# Patient Record
Sex: Female | Born: 1965 | Race: White | Hispanic: No | Smoking: Current every day smoker
Health system: Southern US, Community
[De-identification: ages and names within clinical notes are randomized; demographics above are authoritative.]

## PROBLEM LIST (undated history)

## (undated) DIAGNOSIS — R0600 Dyspnea, unspecified: Secondary | ICD-10-CM

## (undated) DIAGNOSIS — T8859XA Other complications of anesthesia, initial encounter: Secondary | ICD-10-CM

## (undated) DIAGNOSIS — F419 Anxiety disorder, unspecified: Secondary | ICD-10-CM

## (undated) DIAGNOSIS — Z8711 Personal history of peptic ulcer disease: Secondary | ICD-10-CM

## (undated) DIAGNOSIS — Z8639 Personal history of other endocrine, nutritional and metabolic disease: Secondary | ICD-10-CM

## (undated) DIAGNOSIS — M1990 Unspecified osteoarthritis, unspecified site: Secondary | ICD-10-CM

## (undated) DIAGNOSIS — M545 Low back pain, unspecified: Secondary | ICD-10-CM

## (undated) DIAGNOSIS — G90A Postural orthostatic tachycardia syndrome (POTS): Secondary | ICD-10-CM

## (undated) DIAGNOSIS — F32A Depression, unspecified: Secondary | ICD-10-CM

## (undated) DIAGNOSIS — J189 Pneumonia, unspecified organism: Secondary | ICD-10-CM

## (undated) DIAGNOSIS — T4145XA Adverse effect of unspecified anesthetic, initial encounter: Secondary | ICD-10-CM

## (undated) DIAGNOSIS — K219 Gastro-esophageal reflux disease without esophagitis: Secondary | ICD-10-CM

## (undated) DIAGNOSIS — G8929 Other chronic pain: Secondary | ICD-10-CM

## (undated) DIAGNOSIS — J449 Chronic obstructive pulmonary disease, unspecified: Secondary | ICD-10-CM

## (undated) DIAGNOSIS — Z8719 Personal history of other diseases of the digestive system: Secondary | ICD-10-CM

## (undated) HISTORY — PX: TONSILLECTOMY: SUR1361

## (undated) HISTORY — DX: Postural orthostatic tachycardia syndrome (POTS): G90.A

## (undated) HISTORY — PX: BACK SURGERY: SHX140

---

## 1992-11-29 DIAGNOSIS — Z8711 Personal history of peptic ulcer disease: Secondary | ICD-10-CM

## 1992-11-29 HISTORY — PX: UPPER GASTROINTESTINAL ENDOSCOPY: SHX188

## 1992-11-29 HISTORY — DX: Personal history of peptic ulcer disease: Z87.11

## 1995-11-30 HISTORY — PX: DILATION AND CURETTAGE OF UTERUS: SHX78

## 1995-11-30 HISTORY — PX: VAGINAL HYSTERECTOMY: SUR661

## 2011-11-30 DIAGNOSIS — J189 Pneumonia, unspecified organism: Secondary | ICD-10-CM

## 2011-11-30 HISTORY — DX: Pneumonia, unspecified organism: J18.9

## 2011-11-30 HISTORY — PX: ANTERIOR LUMBAR FUSION: SHX1170

## 2017-05-06 ENCOUNTER — Encounter (HOSPITAL_COMMUNITY): Payer: Self-pay | Admitting: Emergency Medicine

## 2017-05-06 ENCOUNTER — Emergency Department (HOSPITAL_COMMUNITY)
Admission: EM | Admit: 2017-05-06 | Discharge: 2017-05-06 | Disposition: A | Discharge status: Home / Self Care | Payer: Medicaid Other | Attending: Emergency Medicine | Admitting: Emergency Medicine

## 2017-05-06 DIAGNOSIS — M542 Cervicalgia: Secondary | ICD-10-CM | POA: Diagnosis present

## 2017-05-06 DIAGNOSIS — F17200 Nicotine dependence, unspecified, uncomplicated: Secondary | ICD-10-CM | POA: Insufficient documentation

## 2017-05-06 DIAGNOSIS — M5012 Mid-cervical disc disorder, unspecified level: Secondary | ICD-10-CM | POA: Diagnosis not present

## 2017-05-06 DIAGNOSIS — Z91040 Latex allergy status: Secondary | ICD-10-CM | POA: Insufficient documentation

## 2017-05-06 DIAGNOSIS — M5020 Other cervical disc displacement, unspecified cervical region: Secondary | ICD-10-CM

## 2017-05-06 MED ORDER — FLUCONAZOLE 100 MG PO TABS: 100.0000 mg | ORAL_TABLET | Freq: Every day | ORAL | 0 refills | Status: DC

## 2017-05-06 MED ORDER — DIAZEPAM 5 MG PO TABS: 5.0000 mg | ORAL_TABLET | Freq: Two times a day (BID) | ORAL | 0 refills | Status: DC

## 2017-05-06 MED ORDER — NYSTATIN 100000 UNIT/ML MT SUSP: 500000.0000 [IU] | Freq: Four times a day (QID) | OROMUCOSAL | 0 refills | Status: DC

## 2017-05-06 MED ORDER — DEXAMETHASONE 2 MG PO TABS: 2.0000 mg | ORAL_TABLET | Freq: Every day | ORAL | 0 refills | Status: AC

## 2017-05-06 MED ORDER — LORAZEPAM 2 MG/ML IJ SOLN
1.0000 mg | Freq: Once | INTRAMUSCULAR | Status: AC
  Administered 2017-05-06: 1 mg via INTRAMUSCULAR
  Filled 2017-05-06: qty 1

## 2017-05-06 NOTE — Discharge Instructions (Signed)
Please read and follow all provided instructions.  Your diagnoses today include:  1. Cervical herniated disc   2. Neck pain     Tests performed today include: Vital signs. See below for your results today.   Medications prescribed:  Take as prescribed   Home care instructions:  Follow any educational materials contained in this packet.  Follow-up instructions: Please follow-up with Neurosurgery for further evaluation of symptoms and treatment. Call on Monday  Return instructions:  Please return to the Emergency Department if you do not get better, if you get worse, or new symptoms OR  - Fever (temperature greater than 101.75F)  - Bleeding that does not stop with holding pressure to the area    -Severe pain (please note that you may be more sore the day after your accident)  - Chest Pain  - Difficulty breathing  - Severe nausea or vomiting  - Inability to tolerate food and liquids  - Passing out  - Skin becoming red around your wounds  - Change in mental status (confusion or lethargy)  - New numbness or weakness    Please return if you have any other emergent concerns.  Additional Information:  Your vital signs today were: BP 104/84 (BP Location: Right Arm)    Pulse (!) 108    Temp 98.3 F (36.8 C) (Oral)    Resp 18    SpO2 96%  If your blood pressure (BP) was elevated above 135/85 this visit, please have this repeated by your doctor within one month. ---------------

## 2017-05-06 NOTE — ED Notes (Signed)
Pt states she has "something laying on my spinal cord". Has MRI of neck in hand. States she was recently admitted to [facility] for this. Saw Dr. Channing Muttersoy (NS) and he wanted to treat with steroids first. Pt feels she needs surgery. States she has "burning, swelling of face and neck" since taking steroids which she completed 1 week ago. Voice sounds slightly hoarse.

## 2017-05-06 NOTE — ED Triage Notes (Signed)
Per EMS: pt from home c/o chronic neck and back pain worse x 3 weeks; pt sts herniated disc and [facility]

## 2017-05-06 NOTE — ED Provider Notes (Signed)
[facility]-EMERGENCY DEPT Provider Note   CSN: [id number] Arrival date & time: 05/06/17  1409  By signing my name below, I, Debra [name], attest that this documentation has been prepared under the direction and in the presence of non-physician practitioner, Audry PiliMohr, Velmer Broadfoot, PA-C. Electronically Signed: Rosana Fretana [name], ED Scribe. 05/06/17. 3:09 PM.  History   Chief Complaint Chief Complaint  Patient presents with  . Back Pain   The history is provided by the patient. No language interpreter was used.   HPI Comments: Debra [name] is a 51 y.o. female who presents to the Emergency Department via EMS complaining of unchanged, neck pain onset 3 weeks ago. [facility] where they did a MRI, diagnosed her with a herniated disc and gave her steroids. Pt has seen no improvement in her pain and states that she needs surgery. Pt states pain is exacerbated by direct pressure and palpation. Pt reports associated muscle spasms in her neck, diaphoresis, facial swelling and tingling to her face. Pt states she has thrush in her mouth and throat. No CP/SOB/ABD pain. No fevers. No N/V. No other complaints at this time.    History reviewed. No pertinent past medical history.  There are no active problems to display for this patient.   History reviewed. No pertinent surgical history.  OB History    No data available       Home Medications    Prior to Admission medications   Not on File    Family History History reviewed. No pertinent family history.  Social History Social History  Substance Use Topics  . Smoking status: Current Every Day Smoker  . Smokeless tobacco: Never Used  . Alcohol use No     Allergies   Latex   Review of Systems Review of Systems  Constitutional: Positive for diaphoresis. Negative for fever.  HENT: Positive for facial swelling.   Respiratory: Negative for shortness of breath.   Cardiovascular: Negative for chest pain.  Gastrointestinal: Negative for  abdominal pain, nausea and vomiting.  Musculoskeletal: Positive for neck pain.  Neurological: Positive for numbness.   Physical Exam Updated Vital Signs BP 104/84 (BP Location: Right Arm)   Pulse (!) 108   Temp 98.3 F (36.8 C) (Oral)   Resp 18   SpO2 96%   Physical Exam  Constitutional: She is oriented to person, place, and time. Vital signs are normal. She appears well-developed and well-nourished.  HENT:  Head: Normocephalic and atraumatic.  Right Ear: Hearing normal.  Left Ear: Hearing normal.  Eyes: Conjunctivae and EOM are normal. Pupils are equal, round, and reactive to light.  Neck: Normal range of motion. Neck supple.  Cardiovascular: Normal rate, regular rhythm, normal heart sounds and intact distal pulses.   Pulmonary/Chest: Effort normal and breath sounds normal.  Musculoskeletal: Normal range of motion.  Neurological: She is alert and oriented to person, place, and time. She exhibits normal muscle tone.  Equal upper extremity grip strength bilaterally. Slight decrease in sensation on left compared to the right. ROM intact. No atrophy   Skin: Skin is warm and dry.  Psychiatric: She has a normal mood and affect. Her speech is normal and behavior is normal. Thought content normal.  Nursing note and vitals reviewed.  ED Treatments / Results  DIAGNOSTIC STUDIES: Oxygen Saturation is 96% on RA, normal by my interpretation.   COORDINATION OF CARE: 2:53 PM-Discussed next steps with pt including consulting with Neuro. Pt verbalized understanding and is agreeable with the plan.   Labs (all  labs ordered are listed, but only abnormal results are displayed) Labs Reviewed - No data to display  EKG  EKG Interpretation None       Radiology No results found.  Procedures Procedures (including critical care time)  Medications Ordered in ED Medications - No data to display   Initial Impression / Assessment and Plan / ED Course  I have reviewed the triage vital  signs and the nursing notes.  Pertinent labs & imaging results that were available during my care of the patient were reviewed by me and considered in my medical decision making (see chart for details).  Final Clinical Impressions(s) / ED Diagnoses   {I have reviewed and evaluated the relevant imaging studies.  {I have reviewed the relevant previous healthcare records.  {I obtained HPI from historian.   ED Course:  Assessment: Pt is a 51 y.o. female who presents with worsening left sided neck pain and numbness to left arm. Seen at [facility] 3 weeks ago with diagnoses of herniated disc on C6-C7. MRI report given by patent shows cord flattening on left and left C7 nerve root impingement. On exam, pt in NAD. Nontoxic/nonseptic appearing. VSS. Afebrile. Lungs CTA. Heart RRR. Mild decrease in sensation of Left UE compared to Right. No atrophy. Grip strengths equal. Consult to Neurosurgery (Dr. Danielle Dess) reviewed MRI. No acute pathology identified. Continue Dexamethasone. Given Nystatin for Thrush. Plan is to DC home with follow up with Neurosurgery. At time of discharge, Patient is in no acute distress. Vital Signs are stable. Patient is able to ambulate. Patient able to tolerate PO.   Disposition/Plan:  DC Home Additional Verbal discharge instructions given and discussed with patient.  Pt Instructed to f/u with Neurosurgery in the next week for evaluation and treatment of symptoms. Return precautions given Pt acknowledges and agrees with plan  Supervising Physician Tilden Fossa, MD  Final diagnoses:  Cervical herniated disc  Neck pain    New Prescriptions New Prescriptions   No medications on file   I personally performed the services described in this documentation, which was scribed in my presence. The recorded information has been reviewed and is accurate.    Audry Pili, PA-C 05/06/17 1626    Tilden Fossa, MD 05/07/17 [phone]

## 2017-05-11 ENCOUNTER — Other Ambulatory Visit: Payer: Self-pay | Admitting: Neurosurgery

## 2017-05-17 ENCOUNTER — Encounter (HOSPITAL_COMMUNITY): Payer: Self-pay | Admitting: *Deleted

## 2017-05-18 ENCOUNTER — Ambulatory Visit (HOSPITAL_COMMUNITY)
Admission: RE | Admit: 2017-05-18 | Discharge: 2017-05-19 | Disposition: A | Discharge status: Home / Self Care | Payer: Medicaid Other | Source: Ambulatory Visit | Attending: Neurosurgery | Admitting: Neurosurgery

## 2017-05-18 ENCOUNTER — Ambulatory Visit (HOSPITAL_COMMUNITY): Payer: Medicaid Other

## 2017-05-18 ENCOUNTER — Ambulatory Visit (HOSPITAL_COMMUNITY): Payer: Medicaid Other | Admitting: Certified Registered Nurse Anesthetist

## 2017-05-18 ENCOUNTER — Encounter (HOSPITAL_COMMUNITY)
Admission: RE | Disposition: A | Discharge status: Home / Self Care | Payer: Self-pay | Source: Ambulatory Visit | Attending: Neurosurgery

## 2017-05-18 ENCOUNTER — Encounter (HOSPITAL_COMMUNITY): Payer: Self-pay | Admitting: *Deleted

## 2017-05-18 DIAGNOSIS — Z79899 Other long term (current) drug therapy: Secondary | ICD-10-CM | POA: Insufficient documentation

## 2017-05-18 DIAGNOSIS — M542 Cervicalgia: Secondary | ICD-10-CM | POA: Diagnosis present

## 2017-05-18 DIAGNOSIS — Z981 Arthrodesis status: Secondary | ICD-10-CM | POA: Diagnosis not present

## 2017-05-18 DIAGNOSIS — F17200 Nicotine dependence, unspecified, uncomplicated: Secondary | ICD-10-CM | POA: Insufficient documentation

## 2017-05-18 DIAGNOSIS — M2578 Osteophyte, vertebrae: Secondary | ICD-10-CM | POA: Insufficient documentation

## 2017-05-18 DIAGNOSIS — K219 Gastro-esophageal reflux disease without esophagitis: Secondary | ICD-10-CM | POA: Insufficient documentation

## 2017-05-18 DIAGNOSIS — M50123 Cervical disc disorder at C6-C7 level with radiculopathy: Secondary | ICD-10-CM | POA: Insufficient documentation

## 2017-05-18 DIAGNOSIS — Z419 Encounter for procedure for purposes other than remedying health state, unspecified: Secondary | ICD-10-CM

## 2017-05-18 DIAGNOSIS — M5020 Other cervical disc displacement, unspecified cervical region: Secondary | ICD-10-CM | POA: Diagnosis present

## 2017-05-18 HISTORY — DX: Anxiety disorder, unspecified: F41.9

## 2017-05-18 HISTORY — DX: Gastro-esophageal reflux disease without esophagitis: K21.9

## 2017-05-18 HISTORY — PX: ANTERIOR CERVICAL DECOMP/DISCECTOMY FUSION: SHX1161

## 2017-05-18 HISTORY — DX: Pneumonia, unspecified organism: J18.9

## 2017-05-18 HISTORY — DX: Personal history of peptic ulcer disease: Z87.11

## 2017-05-18 HISTORY — DX: Dyspnea, unspecified: R06.00

## 2017-05-18 LAB — SURGICAL PCR SCREEN
MRSA, PCR: NEGATIVE
STAPHYLOCOCCUS AUREUS: NEGATIVE

## 2017-05-18 LAB — CBC
HCT: 43.4 % (ref 36.0–46.0)
HEMOGLOBIN: 14.3 g/dL (ref 12.0–15.0)
MCH: 31.6 pg (ref 26.0–34.0)
MCHC: 32.9 g/dL (ref 30.0–36.0)
MCV: 96 fL (ref 78.0–100.0)
Platelets: 472 10*3/uL — ABNORMAL HIGH (ref 150–400)
RBC: 4.52 MIL/uL (ref 3.87–5.11)
RDW: 14.5 % (ref 11.5–15.5)
WBC: 12.5 10*3/uL — AB (ref 4.0–10.5)

## 2017-05-18 SURGERY — ANTERIOR CERVICAL DECOMPRESSION/DISCECTOMY FUSION 1 LEVEL
Anesthesia: General

## 2017-05-18 MED ORDER — DIAZEPAM 5 MG PO TABS
5.0000 mg | ORAL_TABLET | Freq: Four times a day (QID) | ORAL | Status: DC | PRN
  Administered 2017-05-18 – 2017-05-19 (×2): 5 mg via ORAL
  Filled 2017-05-18 (×2): qty 1

## 2017-05-18 MED ORDER — ONDANSETRON HCL 4 MG/2ML IJ SOLN
INTRAMUSCULAR | Status: DC | PRN
  Administered 2017-05-18: 4 mg via INTRAVENOUS

## 2017-05-18 MED ORDER — SODIUM CHLORIDE 0.9% FLUSH: 3.0000 mL | INTRAVENOUS | Status: DC | PRN

## 2017-05-18 MED ORDER — DEXAMETHASONE SODIUM PHOSPHATE 10 MG/ML IJ SOLN
INTRAMUSCULAR | Status: AC
  Filled 2017-05-18: qty 1

## 2017-05-18 MED ORDER — DEXTROSE 5 % IV SOLN
INTRAVENOUS | Status: DC | PRN
  Administered 2017-05-18: 50 ug/min via INTRAVENOUS

## 2017-05-18 MED ORDER — FENTANYL CITRATE (PF) 100 MCG/2ML IJ SOLN
50.0000 ug | Freq: Once | INTRAMUSCULAR | Status: AC
  Administered 2017-05-18: 50 ug via INTRAVENOUS
  Filled 2017-05-18: qty 2

## 2017-05-18 MED ORDER — PROPOFOL 10 MG/ML IV BOLUS
INTRAVENOUS | Status: DC | PRN
  Administered 2017-05-18: 130 mg via INTRAVENOUS

## 2017-05-18 MED ORDER — ONDANSETRON HCL 4 MG PO TABS: 4.0000 mg | ORAL_TABLET | Freq: Four times a day (QID) | ORAL | Status: DC | PRN

## 2017-05-18 MED ORDER — BACLOFEN 10 MG PO TABS
20.0000 mg | ORAL_TABLET | Freq: Three times a day (TID) | ORAL | Status: DC
  Administered 2017-05-18: 20 mg via ORAL
  Filled 2017-05-18: qty 2

## 2017-05-18 MED ORDER — PHENYLEPHRINE HCL 10 MG/ML IJ SOLN
INTRAMUSCULAR | Status: DC | PRN
  Administered 2017-05-18 (×2): 80 ug via INTRAVENOUS
  Administered 2017-05-18 (×2): 40 ug via INTRAVENOUS

## 2017-05-18 MED ORDER — SODIUM CHLORIDE 0.9 % IV SOLN: 250.0000 mL | INTRAVENOUS | Status: DC

## 2017-05-18 MED ORDER — POTASSIUM CHLORIDE IN NACL 20-0.9 MEQ/L-% IV SOLN
INTRAVENOUS | Status: DC
  Administered 2017-05-18: 22:00:00 via INTRAVENOUS
  Filled 2017-05-18 (×2): qty 1000

## 2017-05-18 MED ORDER — CLINDAMYCIN PHOSPHATE 900 MG/50ML IV SOLN
900.0000 mg | INTRAVENOUS | Status: AC
  Administered 2017-05-18: 900 mg via INTRAVENOUS
  Filled 2017-05-18: qty 50

## 2017-05-18 MED ORDER — ROCURONIUM BROMIDE 10 MG/ML (PF) SYRINGE
PREFILLED_SYRINGE | INTRAVENOUS | Status: AC
  Filled 2017-05-18: qty 5

## 2017-05-18 MED ORDER — SUGAMMADEX SODIUM 200 MG/2ML IV SOLN
INTRAVENOUS | Status: DC | PRN
  Administered 2017-05-18: 200 mg via INTRAVENOUS

## 2017-05-18 MED ORDER — DEXAMETHASONE SODIUM PHOSPHATE 4 MG/ML IJ SOLN
INTRAMUSCULAR | Status: DC | PRN
  Administered 2017-05-18: 10 mg via INTRAVENOUS

## 2017-05-18 MED ORDER — TRAMADOL HCL 50 MG PO TABS
50.0000 mg | ORAL_TABLET | Freq: Four times a day (QID) | ORAL | Status: DC | PRN
  Administered 2017-05-18: 50 mg via ORAL
  Filled 2017-05-18: qty 1

## 2017-05-18 MED ORDER — PANTOPRAZOLE SODIUM 40 MG PO TBEC
40.0000 mg | DELAYED_RELEASE_TABLET | Freq: Every day | ORAL | Status: DC
  Administered 2017-05-19: 40 mg via ORAL

## 2017-05-18 MED ORDER — 0.9 % SODIUM CHLORIDE (POUR BTL) OPTIME
TOPICAL | Status: DC | PRN
  Administered 2017-05-18: 1000 mL

## 2017-05-18 MED ORDER — HYDROMORPHONE HCL 1 MG/ML IJ SOLN
INTRAMUSCULAR | Status: AC
  Administered 2017-05-18: 0.5 mg via INTRAVENOUS
  Filled 2017-05-18: qty 0.5

## 2017-05-18 MED ORDER — CEFAZOLIN SODIUM-DEXTROSE 2-4 GM/100ML-% IV SOLN: 2.0000 g | INTRAVENOUS | Status: DC

## 2017-05-18 MED ORDER — FENTANYL CITRATE (PF) 100 MCG/2ML IJ SOLN
INTRAMUSCULAR | Status: DC | PRN
  Administered 2017-05-18: 100 ug via INTRAVENOUS
  Administered 2017-05-18 (×3): 50 ug via INTRAVENOUS

## 2017-05-18 MED ORDER — ONDANSETRON HCL 4 MG/2ML IJ SOLN: 4.0000 mg | Freq: Four times a day (QID) | INTRAMUSCULAR | Status: DC | PRN

## 2017-05-18 MED ORDER — THROMBIN 5000 UNITS EX SOLR
CUTANEOUS | Status: DC | PRN
  Administered 2017-05-18: 10000 [IU] via TOPICAL

## 2017-05-18 MED ORDER — LIDOCAINE-EPINEPHRINE 0.5 %-1:200000 IJ SOLN
INTRAMUSCULAR | Status: DC | PRN
  Administered 2017-05-18: 5 mL

## 2017-05-18 MED ORDER — SODIUM CHLORIDE 0.9% FLUSH
3.0000 mL | Freq: Two times a day (BID) | INTRAVENOUS | Status: DC
  Administered 2017-05-18 – 2017-05-19 (×2): 3 mL via INTRAVENOUS

## 2017-05-18 MED ORDER — HYDROMORPHONE HCL 1 MG/ML IJ SOLN
0.2500 mg | INTRAMUSCULAR | Status: DC | PRN
  Administered 2017-05-18 (×2): 0.5 mg via INTRAVENOUS

## 2017-05-18 MED ORDER — PROPOFOL 10 MG/ML IV BOLUS
INTRAVENOUS | Status: AC
  Filled 2017-05-18: qty 20

## 2017-05-18 MED ORDER — THROMBIN 5000 UNITS EX SOLR
CUTANEOUS | Status: AC
  Filled 2017-05-18: qty 10000

## 2017-05-18 MED ORDER — SENNOSIDES-DOCUSATE SODIUM 8.6-50 MG PO TABS: 1.0000 | ORAL_TABLET | Freq: Every evening | ORAL | Status: DC | PRN

## 2017-05-18 MED ORDER — IPRATROPIUM BROMIDE HFA 17 MCG/ACT IN AERS
INHALATION_SPRAY | RESPIRATORY_TRACT | Status: DC | PRN
  Administered 2017-05-18: 4 via RESPIRATORY_TRACT

## 2017-05-18 MED ORDER — ZOLPIDEM TARTRATE 5 MG PO TABS: 5.0000 mg | ORAL_TABLET | Freq: Every evening | ORAL | Status: DC | PRN

## 2017-05-18 MED ORDER — MENTHOL 3 MG MT LOZG
1.0000 | LOZENGE | OROMUCOSAL | Status: DC | PRN
  Filled 2017-05-18: qty 9

## 2017-05-18 MED ORDER — SUCCINYLCHOLINE CHLORIDE 200 MG/10ML IV SOSY
PREFILLED_SYRINGE | INTRAVENOUS | Status: AC
  Filled 2017-05-18: qty 10

## 2017-05-18 MED ORDER — DOCUSATE SODIUM 100 MG PO CAPS
100.0000 mg | ORAL_CAPSULE | Freq: Two times a day (BID) | ORAL | Status: DC
  Administered 2017-05-18 – 2017-05-19 (×2): 100 mg via ORAL
  Filled 2017-05-18 (×2): qty 1

## 2017-05-18 MED ORDER — ACETAMINOPHEN 650 MG RE SUPP: 650.0000 mg | RECTAL | Status: DC | PRN

## 2017-05-18 MED ORDER — ACETAMINOPHEN 325 MG PO TABS: 650.0000 mg | ORAL_TABLET | ORAL | Status: DC | PRN

## 2017-05-18 MED ORDER — CHLORHEXIDINE GLUCONATE CLOTH 2 % EX PADS: 6.0000 | MEDICATED_PAD | Freq: Once | CUTANEOUS | Status: DC

## 2017-05-18 MED ORDER — PHENOL 1.4 % MT LIQD: 1.0000 | OROMUCOSAL | Status: DC | PRN

## 2017-05-18 MED ORDER — LIDOCAINE 2% (20 MG/ML) 5 ML SYRINGE
INTRAMUSCULAR | Status: AC
  Filled 2017-05-18: qty 5

## 2017-05-18 MED ORDER — BISACODYL 5 MG PO TBEC: 5.0000 mg | DELAYED_RELEASE_TABLET | Freq: Every day | ORAL | Status: DC | PRN

## 2017-05-18 MED ORDER — PREGABALIN 50 MG PO CAPS
100.0000 mg | ORAL_CAPSULE | Freq: Three times a day (TID) | ORAL | Status: DC
  Administered 2017-05-18 – 2017-05-19 (×2): 100 mg via ORAL
  Filled 2017-05-18 (×2): qty 2

## 2017-05-18 MED ORDER — SUCCINYLCHOLINE CHLORIDE 20 MG/ML IJ SOLN
INTRAMUSCULAR | Status: DC | PRN
  Administered 2017-05-18: 100 mg via INTRAVENOUS

## 2017-05-18 MED ORDER — LIDOCAINE-EPINEPHRINE 0.5 %-1:200000 IJ SOLN
INTRAMUSCULAR | Status: AC
  Filled 2017-05-18: qty 1

## 2017-05-18 MED ORDER — CELECOXIB 200 MG PO CAPS
200.0000 mg | ORAL_CAPSULE | Freq: Two times a day (BID) | ORAL | Status: DC
  Administered 2017-05-19: 200 mg via ORAL
  Filled 2017-05-18 (×3): qty 1

## 2017-05-18 MED ORDER — MIDAZOLAM HCL 5 MG/5ML IJ SOLN
INTRAMUSCULAR | Status: DC | PRN
  Administered 2017-05-18: 2 mg via INTRAVENOUS

## 2017-05-18 MED ORDER — ONDANSETRON HCL 4 MG/2ML IJ SOLN
4.0000 mg | Freq: Once | INTRAMUSCULAR | Status: AC
  Administered 2017-05-18: 4 mg via INTRAVENOUS
  Filled 2017-05-18: qty 2

## 2017-05-18 MED ORDER — ONDANSETRON HCL 4 MG/2ML IJ SOLN
INTRAMUSCULAR | Status: AC
  Filled 2017-05-18: qty 2

## 2017-05-18 MED ORDER — FENTANYL CITRATE (PF) 250 MCG/5ML IJ SOLN
INTRAMUSCULAR | Status: AC
  Filled 2017-05-18: qty 5

## 2017-05-18 MED ORDER — LACTATED RINGERS IV SOLN
INTRAVENOUS | Status: DC
  Administered 2017-05-18 (×2): via INTRAVENOUS

## 2017-05-18 MED ORDER — MUPIROCIN 2 % EX OINT
1.0000 "application " | TOPICAL_OINTMENT | Freq: Once | CUTANEOUS | Status: AC
  Administered 2017-05-18: 1 via TOPICAL
  Filled 2017-05-18: qty 22

## 2017-05-18 MED ORDER — LIDOCAINE HCL (CARDIAC) 20 MG/ML IV SOLN
INTRAVENOUS | Status: DC | PRN
  Administered 2017-05-18: 60 mg via INTRAVENOUS

## 2017-05-18 MED ORDER — HEMOSTATIC AGENTS (NO CHARGE) OPTIME
TOPICAL | Status: DC | PRN
  Administered 2017-05-18: 1 via TOPICAL

## 2017-05-18 MED ORDER — MIDAZOLAM HCL 2 MG/2ML IJ SOLN
INTRAMUSCULAR | Status: AC
  Filled 2017-05-18: qty 2

## 2017-05-18 MED ORDER — CITALOPRAM HYDROBROMIDE 40 MG PO TABS
40.0000 mg | ORAL_TABLET | Freq: Every evening | ORAL | Status: DC
  Administered 2017-05-18: 40 mg via ORAL
  Filled 2017-05-18: qty 1

## 2017-05-18 SURGICAL SUPPLY — 50 items
BLADE CLIPPER SURG (BLADE) IMPLANT
BNDG GAUZE ELAST 4 BULKY (GAUZE/BANDAGES/DRESSINGS) IMPLANT
BUR DRUM 4.0 (BURR) ×2 IMPLANT
BUR DRUM 4.0MM (BURR) ×1
BUR MATCHSTICK NEURO 3.0 LAGG (BURR) ×3 IMPLANT
CANISTER SUCT 3000ML PPV (MISCELLANEOUS) ×3 IMPLANT
CARTRIDGE OIL MAESTRO DRILL (MISCELLANEOUS) ×1 IMPLANT
DECANTER SPIKE VIAL GLASS SM (MISCELLANEOUS) ×3 IMPLANT
DERMABOND ADVANCED (GAUZE/BANDAGES/DRESSINGS) ×2
DERMABOND ADVANCED .7 DNX12 (GAUZE/BANDAGES/DRESSINGS) ×1 IMPLANT
DIFFUSER DRILL AIR PNEUMATIC (MISCELLANEOUS) ×3 IMPLANT
DRAPE HALF SHEET 40X57 (DRAPES) IMPLANT
DRAPE LAPAROTOMY 100X72 PEDS (DRAPES) ×3 IMPLANT
DRAPE MICROSCOPE LEICA (MISCELLANEOUS) ×3 IMPLANT
DRAPE POUCH INSTRU U-SHP 10X18 (DRAPES) ×3 IMPLANT
DURAPREP 6ML APPLICATOR 50/CS (WOUND CARE) ×3 IMPLANT
ELECT COATED BLADE 2.86 ST (ELECTRODE) ×3 IMPLANT
ELECT REM PT RETURN 9FT ADLT (ELECTROSURGICAL) ×3
ELECTRODE REM PT RTRN 9FT ADLT (ELECTROSURGICAL) ×1 IMPLANT
GAUZE SPONGE 4X4 16PLY XRAY LF (GAUZE/BANDAGES/DRESSINGS) IMPLANT
GLOVE ECLIPSE 6.5 STRL STRAW (GLOVE) IMPLANT
GLOVE EXAM NITRILE LRG STRL (GLOVE) IMPLANT
GLOVE EXAM NITRILE XL STR (GLOVE) IMPLANT
GLOVE EXAM NITRILE XS STR PU (GLOVE) IMPLANT
GOWN STRL REUS W/ TWL LRG LVL3 (GOWN DISPOSABLE) ×2 IMPLANT
GOWN STRL REUS W/ TWL XL LVL3 (GOWN DISPOSABLE) IMPLANT
GOWN STRL REUS W/TWL 2XL LVL3 (GOWN DISPOSABLE) IMPLANT
GOWN STRL REUS W/TWL LRG LVL3 (GOWN DISPOSABLE) ×4
GOWN STRL REUS W/TWL XL LVL3 (GOWN DISPOSABLE)
KIT BASIN OR (CUSTOM PROCEDURE TRAY) ×3 IMPLANT
KIT ROOM TURNOVER OR (KITS) ×3 IMPLANT
NEEDLE HYPO 25X1 1.5 SAFETY (NEEDLE) ×3 IMPLANT
NEEDLE SPNL 22GX3.5 QUINCKE BK (NEEDLE) ×3 IMPLANT
NS IRRIG 1000ML POUR BTL (IV SOLUTION) ×3 IMPLANT
OIL CARTRIDGE MAESTRO DRILL (MISCELLANEOUS) ×3
PACK LAMINECTOMY NEURO (CUSTOM PROCEDURE TRAY) ×3 IMPLANT
PAD ARMBOARD 7.5X6 YLW CONV (MISCELLANEOUS) ×9 IMPLANT
PLATE HELIX R 22MM (Plate) ×3 IMPLANT
RUBBERBAND STERILE (MISCELLANEOUS) ×6 IMPLANT
SCREW 4.0X13 (Screw) ×4 IMPLANT
SCREW 4.0X13MM (Screw) ×8 IMPLANT
SPACER ACF PARALLEL 7MM (Bone Implant) ×3 IMPLANT
SPONGE INTESTINAL PEANUT (DISPOSABLE) ×3 IMPLANT
SPONGE SURGIFOAM ABS GEL SZ50 (HEMOSTASIS) ×3 IMPLANT
SUT VIC AB 0 CT1 27 (SUTURE) ×2
SUT VIC AB 0 CT1 27XBRD ANTBC (SUTURE) ×1 IMPLANT
SUT VIC AB 3-0 SH 8-18 (SUTURE) ×9 IMPLANT
TOWEL GREEN STERILE (TOWEL DISPOSABLE) ×3 IMPLANT
TOWEL GREEN STERILE FF (TOWEL DISPOSABLE) ×3 IMPLANT
WATER STERILE IRR 1000ML POUR (IV SOLUTION) ×3 IMPLANT

## 2017-05-18 NOTE — H&P (Signed)
BP (!) 127/91   Pulse (!) 119   Temp 97.9 F (36.6 C) (Oral)   Resp 18   Ht 5\' 6"  (1.676 m)   Wt 74.8 kg (165 lb)   SpO2 97%   BMI 26.63 kg/m  HISTORY OF PRESENT ILLNESS: Debra [name] is a 51 year old woman who presents today for evaluation of pain that she has in her neck and left upper extremity. She has had this pain now for approximately a month. She has never had pain like this in the past. She said it started on May 14. Says she woke up and had severe pain in the left upper extremity. She thought maybe it was related to a fall 2 years ago. Says nothing relieves the pain. She has been to the emergency room multiple times. She has been placed on steroids and has been on steroids in one form or another, Decadron, prednisone or the like over the last month. She has pain in arm, neck, legs and back. She feels a burning all over her body. She feels as if she cannot keep her balance. She feels she has swelling in the stomach, neck, throat and face. She sweats each and every day. She has numbness and tingling in the head and neck and has had a month were she cannot sleep more than 2-3 hours a day. She has mouth sores and severe thrush, which she says she has chronically. She has leg pain with walking, neck pain, arm weakness, leg weakness, back pain, arm pain, leg pain, anxiety and excessive thirst. She is right handed. SOCIAL HISTORY: She does smoke. She works as a Pharmacologistpharmacy technician. She does not use alcohol. No history of illicit drug use. PAST MEDICAL HISTORY: Includes a fusion and hysterectomy. MEDICATIONS: She is currently taking Lyrica, baclofen, Celexa and Decadron 2 mg once a day. PHYSICAL EXAMINATION: Vital signs are as follows: Height 5 feet 1 inch, weight 164 pounds, temperature is 98.9 degrees Fahrenheit, blood pressure is 129/80, pulse is 76. Pain is 4/10. General: She is alert, oriented x4. Answers all questions appropriately. Neurologic: Memory, language, attention span, and fund  of knowledge are normal. Speech is clear. It is fluent. She is extraordinarily anxious. She has weakness 4/5 in the left triceps, left biceps, otherwise strength is 5/5. Reflexes 2+ biceps, triceps, brachioradialis, knees, and ankles. Proprioception is intact in both the upper and lower extremities. Romberg test is negative. She can tandem walk. Muscle tone, bulk, coordination is otherwise normal. Pupils equal, round, reactive to light. Full extraocular movements. Full visual fields. Hearing intact to voice. Uvula elevates in the midline. Shoulder shrug is normal. Tongue protrudes in the midline. IMAGING: MRI shows a fairly large herniated disc eccentric to the left at C6-7. Spinal cord has normal signal. The rest of the spine looks quite good. DIAGNOSIS:  Displaced disc C6-7, left.  Left C7 radiculopathy.  Given the fact that she has significant weakness in the triceps and a herniated disc on MRI, I have recommended and she has agreed to undergo operative decompression with anterior plating at C6-7. I will plan on doing this next week, Wednesday. I would expect only on an overnight hospitalization. She received a detailed instruction sheet going over the risks and benefits of the procedure, which were discussed, bleeding, infection, no relief, need for further surgery, fusion failure, hardware failure, damage to the spinal cord, paralysis, weakness in one of both forearms, one or both legs, and/or bowel or bladder dysfunction.

## 2017-05-18 NOTE — Anesthesia Procedure Notes (Signed)
Procedure Name: Intubation Date/Time: 05/18/2017 3:44 PM Performed by: Ollen Bowl Pre-anesthesia Checklist: Patient identified, Emergency Drugs available, Suction available, Patient being monitored and Timeout performed Patient Re-evaluated:Patient Re-evaluated prior to inductionOxygen Delivery Method: Circle system utilized Preoxygenation: Pre-oxygenation with 100% oxygen Intubation Type: IV induction, Rapid sequence and Cricoid Pressure applied Laryngoscope Size: Mac and 3 Grade View: Grade I Tube size: 7.0 mm Number of attempts: 1 Airway Equipment and Method: Patient positioned with wedge pillow and Stylet Placement Confirmation: ETT inserted through vocal cords under direct vision,  positive ETCO2 and breath sounds checked- equal and bilateral Secured at: 22 cm Tube secured with: Tape Dental Injury: Teeth and Oropharynx as per pre-operative assessment  Comments: Placed by Guerry Bruin

## 2017-05-18 NOTE — Op Note (Signed)
05/18/2017  5:21 PM  PATIENT:  Debra [name]  51 y.o. female with an acute disc herniation at C6/7 on the left, and left triceps weakness.  PRE-OPERATIVE DIAGNOSIS:  HNP C6/7  POST-OPERATIVE DIAGNOSIS:  HNP C6/7  PROCEDURE:  Anterior Cervical decompression C6/7 Arthrodesis C6/7 with 7mm structural allograft Anterior instrumentation(Helix R) C6/7  SURGEON:   Surgeon(s): Coletta Memosabbell, Aj Crunkleton, MD   ASSISTANTS:none  ANESTHESIA:   general  EBL:  Total I/O In: 1000 [I.V.:1000] Out: 50 [Blood:50]  BLOOD ADMINISTERED:none  CELL SAVER GIVEN:none  COUNT:per nursing  DRAINS: none   SPECIMEN:  No Specimen  DICTATION: [name] was taken to the operating room, intubated, and placed under general anesthesia without difficulty. She was positioned supine with her head in slight extension on a horseshoe headrest. The neck was prepped and draped in a sterile manner. I infiltrated 5 cc's 1/2%lidocaine/1:200,000 strength epinephrine into the planned incision starting from the midline to the medial border of the left sternocleidomastoid muscle. I opened the incision with a 10 blade and dissected sharply through soft tissue to the platysma. I dissected in the plane superior to the platysma both rostrally and caudally. I then opened the platysma in a horizontal fashion with Metzenbaum scissors, and dissected in the inferior plane rostrally and caudally. With both blunt and sharp technique I created an avascular corridor to the cervical spine. I placed a spinal needle(s) in the disc space at 5/6 . I then reflected the longus colli from C6 to C7 and placed self retaining retractors. I opened the disc space(s) at C6/7 with a 15 blade. I removed disc with curettes, Kerrison punches, and the drill. Using the drill I removed osteophytes and prepared for the decompression.  I decompressed the spinal canal and the C7 root(s) with the drill, Kerrison punches, and the curettes. I used the microscope to aid in  microdissection. I removed the posterior longitudinal ligament to fully expose and decompress the thecal sac. I exposed the roots laterally taking down the 6/7 uncovertebral joints. With the decompression complete I moved on to the arthrodesis. I used the drill to level the surfaces of C6,7. I removed soft tissue to prepare the disc space and the bony surfaces. I measured the space and placed a 7mm structural allograft into the disc space.  I then placed the anterior instrumentation. I placed 2 screws in each vertebral body through the plate. I locked the screws into place. Intraoperative xray showed the graft, plate, and screws to be in good position. I irrigated the wound, achieved hemostasis, and closed the wound in layers. I approximated the platysma, and the subcuticular plane with vicryl sutures. I used Dermabond for a sterile dressing.   PLAN OF CARE: Admit for overnight observation  PATIENT DISPOSITION:  PACU - hemodynamically stable.   Delay start of Pharmacological VTE agent (>24hrs) due to surgical blood loss or risk of bleeding:  yes

## 2017-05-18 NOTE — Progress Notes (Signed)
MD made aware Ancef ordered and cephalosporin allergy. No new orders at this time.

## 2017-05-18 NOTE — Transfer of Care (Signed)
Immediate Anesthesia Transfer of Care Note  Patient: Debra [name]  Procedure(s) Performed: Procedure(s): ACDF - C6-C7 (N/A)  Patient Location: PACU  Anesthesia Type:General  Level of Consciousness: awake, alert  and oriented  Airway & Oxygen Therapy: Patient Spontanous Breathing and Patient connected to nasal cannula oxygen  Post-op Assessment: Report given to RN, Post -op Vital signs reviewed and stable and Patient moving all extremities X 4  Post vital signs: Reviewed and stable  Last Vitals:  Vitals:   05/18/17 1345 05/18/17 1738  BP: (!) 127/91 100/80  Pulse: (!) 119 (!) 114  Resp: 18 20  Temp: 36.6 C 37.2 C    Last Pain:  Vitals:   05/18/17 1738  TempSrc:   PainSc: (P) 0-No pain      Patients Stated Pain Goal: 5 (05/18/17 1349)  Complications: No apparent anesthesia complications

## 2017-05-18 NOTE — Anesthesia Preprocedure Evaluation (Signed)
Anesthesia Evaluation  Patient identified by MRN, date of birth, ID band Patient awake    Reviewed: Allergy & Precautions, H&P , NPO status , Patient's Chart, lab work & pertinent test results  Airway Mallampati: II  TM Distance: >3 FB Neck ROM: Full    Dental no notable dental hx. (+) Teeth Intact, Dental Advisory Given   Pulmonary Current Smoker,    Pulmonary exam normal breath sounds clear to auscultation       Cardiovascular negative cardio ROS   Rhythm:Regular Rate:Normal     Neuro/Psych Anxiety negative neurological ROS  negative psych ROS   GI/Hepatic Neg liver ROS, GERD  Medicated and Poorly Controlled,  Endo/Other  negative endocrine ROS  Renal/GU negative Renal ROS  negative genitourinary   Musculoskeletal   Abdominal   Peds  Hematology negative hematology ROS (+)   Anesthesia Other Findings   Reproductive/Obstetrics negative OB ROS                             Anesthesia Physical Anesthesia Plan  ASA: II  Anesthesia Plan: General   Post-op Pain Management:    Induction: Intravenous, Rapid sequence and Cricoid pressure planned  PONV Risk Score and Plan: 3 and Ondansetron, Dexamethasone, Propofol and Midazolam  Airway Management Planned:   Additional Equipment:   Intra-op Plan:   Post-operative Plan: Extubation in OR  Informed Consent: I have reviewed the patients History and Physical, chart, labs and discussed the procedure including the risks, benefits and alternatives for the proposed anesthesia with the patient or authorized representative who has indicated his/her understanding and acceptance.   Dental advisory given  Plan Discussed with: CRNA  Anesthesia Plan Comments:         Anesthesia Quick Evaluation

## 2017-05-19 ENCOUNTER — Encounter (HOSPITAL_COMMUNITY): Payer: Self-pay | Admitting: Neurosurgery

## 2017-05-19 DIAGNOSIS — M50123 Cervical disc disorder at C6-C7 level with radiculopathy: Secondary | ICD-10-CM | POA: Diagnosis not present

## 2017-05-19 MED ORDER — OXYCODONE-ACETAMINOPHEN 5-325 MG PO TABS
1.0000 | ORAL_TABLET | ORAL | Status: DC | PRN
  Administered 2017-05-19 (×3): 1 via ORAL
  Filled 2017-05-19 (×3): qty 1

## 2017-05-19 MED ORDER — TRAMADOL HCL 50 MG PO TABS: 50.0000 mg | ORAL_TABLET | Freq: Four times a day (QID) | ORAL | 0 refills | Status: DC | PRN

## 2017-05-19 NOTE — Progress Notes (Signed)
Occupational Therapy Evaluation Patient Details Name: Debra [name] MRN: [id number] DOB: [birth year] Today's Date: 05/19/2017    History of Present Illness                              51 yo s/p Anterior Cervical decompression C6/7. Hx of back surgery.    Clinical Impression   Pt lives alone but will have her son assist as needed. Completed education regarding cervical precautions and ADL/mobility and IADL tasks. Pt educated on HEP for grip/pinch strengthening. Pt will benefit from use of a shower seat to reduce risk of falls and adhere to cervical precautions. Pt safe to DC home when medically stable. Pt to follow up with MD to progress strengthening of LUE as indicated.     Follow Up Recommendations  DC plan and follow up therapy as arranged by surgeon    Equipment Recommendations  Tub/shower seat    Recommendations for Other Services       Precautions / Restrictions Precautions Precautions: Cervical Restrictions Weight Bearing Restrictions: No      Mobility Bed Mobility      education on correct technique to adhere to cervical precautions            Transfers Overall transfer level: Modified independent                    Balance Overall balance assessment: No apparent balance deficits (not formally assessed)                                         ADL either performed or assessed with clinical judgement   ADL                                         General ADL Comments: Completed education regarding cervical precautions during ADL. Pt able to return demonstrate. Educated on cervical precautions during IADL tasks. Rec pt use reacher @ house.     Vision         Perception     Praxis      Pertinent Vitals/Pain Pain Assessment: 0-10 Pain Score: 4  Pain Location: neck, arm Pain Descriptors / Indicators: Aching;Discomfort;Numbness Pain Intervention(s): Limited activity within patient's tolerance;Premedicated  before session     Hand Dominance Right   Extremity/Trunk Assessment Upper Extremity Assessment Upper Extremity Assessment: LUE deficits/detail LUE Deficits / Details: general weakness, greater with tricep extension. Scapular weakness noted. AROM WFL LUE Sensation:  (states she has "a little burning sensation")   Lower Extremity Assessment Lower Extremity Assessment: Overall WFL for tasks assessed (pt complains of general weakness)   Cervical / Trunk Assessment Cervical / Trunk Assessment: Other exceptions (ACDF)   Communication Communication Communication: No difficulties   Cognition Arousal/Alertness: Awake/alert Behavior During Therapy: WFL for tasks assessed/performed Overall Cognitive Status: Within Functional Limits for tasks assessed                                     General Comments       Exercises Exercises: Other exercises Other Exercises Other Exercises: scapular strengthening Other Exercises: theraputty exercises for grip and pinch strengthening - level 2`; handout given  Shoulder Instructions      Home Living Family/patient expects to be discharged to:: Private residence Living Arrangements: Alone Available Help at Discharge: Family;Available 24 hours/day Type of Home: House Home Access: Stairs to enter Entergy CorporationEntrance Stairs-Number of Steps: 3 Entrance Stairs-Rails: Right Home Layout: One level     Bathroom Shower/Tub: IT trainerTub/shower unit;Curtain   Bathroom Toilet: Handicapped height Bathroom Accessibility: Yes How Accessible: Accessible via walker Home Equipment: None          Prior Functioning/Environment Level of Independence: Independent        Comments: drive; not currently working/on disability        OT Problem List: Decreased strength;Decreased knowledge of precautions;Decreased knowledge of use of DME or AE;Impaired sensation;Pain;Impaired UE functional use      OT Treatment/Interventions:      OT Goals(Current  goals can be found in the care plan section) Acute Rehab OT Goals Patient Stated Goal: to work in her yard again OT Goal Formulation: All assessment and education complete, DC therapy  OT Frequency:     Barriers to D/C:            Co-evaluation              AM-PAC PT "6 Clicks" Daily Activity     Outcome Measure Help from another person eating meals?: None Help from another person taking care of personal grooming?: None Help from another person toileting, which includes using toliet, bedpan, or urinal?: None Help from another person bathing (including washing, rinsing, drying)?: None Help from another person to put on and taking off regular upper body clothing?: None Help from another person to put on and taking off regular lower body clothing?: None 6 Click Score: 24   End of Session Nurse Communication: Mobility status  Activity Tolerance: Patient tolerated treatment well Patient left: in bed;with call bell/phone within reach  OT Visit Diagnosis: Muscle weakness (generalized) (M62.81);Pain Pain - Right/Left: Left Pain - part of body: Arm                Time: [id number] OT Time Calculation (min): 28 min Charges:  OT General Charges $OT Visit: 1 Procedure OT Evaluation $OT Eval Low Complexity: 1 Procedure OT Treatments $Self Care/Home Management : 8-22 mins G-Codes: OT G-codes **NOT FOR INPATIENT CLASS** Functional Assessment Tool Used: Clinical judgement Functional Limitation: Self care Self Care Current Status ([id number]([id number]): At least 1 percent but less than 20 percent impaired, limited or restricted Self Care Goal Status ([id number]([id number]): At least 1 percent but less than 20 percent impaired, limited or restricted Self Care Discharge Status [phone](G8989): At least 1 percent but less than 20 percent impaired, limited or restricted   [facility] Kalena Mander, OT/L  [phone] 05/19/2017  [name],[name] 05/19/2017, 9:22 AM

## 2017-05-19 NOTE — Care Management Note (Signed)
Case Management Note  Patient Details  Name: Debra [name] MRN: [id number] Date of Birth: 03/03/1966  Subjective/Objective:                    Action/Plan: Patient discharging home with self care. Pt with orders for tub bench. [facility]aren [facility] notified and will deliver the equipment to the room.  Patient states she has transportation home.   Expected Discharge Date:  05/19/17               Expected Discharge Plan:  Home/Self Care  In-House Referral:     Discharge planning Services  CM Consult  Post Acute Care Choice:  Durable Medical Equipment Choice offered to:  Patient  DME Arranged:  Tub bench DME Agency:  [facility]  HH Arranged:    [facility] Agency:     Status of Service:  Completed, signed off  If discussed at Long Length of Stay Meetings, dates discussed:    Additional Comments:  Kermit BaloKelli F Mariely Mahr, RN 05/19/2017, 12:52 PM

## 2017-05-19 NOTE — Discharge Summary (Signed)
Physician Discharge Summary  Patient ID: Debra [name] MRN: [id number] DOB/AGE: 51/01/1966 51 y.o.  Admit date: 05/18/2017 Discharge date: 05/19/2017  Admission Diagnoses:HNP Cervical C6/7  Discharge Diagnoses: Hnp Cervical C6/7 Active Problems:   HNP (herniated nucleus pulposus), cervical   Discharged Condition: good  Hospital Course: Debra [name] was admitted and taken to the operating room for an uncomplicated ACDF at C6/7. Post op her left upper extremity pain is improved. Still with weakness in the left triceps. Wound is clean, dry, and without signs of infection. Speaking voice is normal.   Treatments: surgery:Anterior Cervical decompression C6/7 Arthrodesis C6/7 with 7mm structural allograft Anterior instrumentation(Helix R) C6/7    Discharge Exam: Blood pressure 126/75, pulse (!) 101, temperature 97.5 F (36.4 C), temperature source Oral, resp. rate 18, height 5\' 6"  (1.676 m), weight 79.3 kg (174 lb 14.4 oz), SpO2 93 %. Alert and oriented x 4, speech is clear, strong, mild weakness left triceps. Disposition: 01-Home or Self Care HNP  Allergies as of 05/19/2017      Reactions   Cefaclor Diarrhea, Rash   hives   Sertraline    Suicidal thoughts, insomnia, nightmares   Amoxicillin-pot Clavulanate Diarrhea   Has patient had a PCN reaction causing immediate rash, facial/tongue/throat swelling, SOB or lightheadedness with hypotension: No Has patient had a PCN reaction causing severe rash involving mucus membranes or skin necrosis: No Has patient had a PCN reaction that required hospitalization: No Has patient had a PCN reaction occurring within the last 10 years: Yes If all of the above answers are "NO", then may proceed with Cephalosporin use.   Codeine Nausea And Vomiting   If pt takes a lot of this med   Latex Itching, Swelling   redness      Medication List    STOP taking these medications   fluconazole 100 MG tablet Commonly known as:  DIFLUCAN   nystatin 100000  UNIT/ML suspension Commonly known as:  MYCOSTATIN     TAKE these medications   baclofen 20 MG tablet Commonly known as:  LIORESAL Take 20 mg by mouth 3 (three) times daily.   citalopram 40 MG tablet Commonly known as:  CELEXA Take 40 mg by mouth every evening.   dexlansoprazole 60 MG capsule Commonly known as:  DEXILANT Take 60 mg by mouth daily.   diazepam 5 MG tablet Commonly known as:  VALIUM Take 1 tablet (5 mg total) by mouth 2 (two) times daily.   pregabalin 100 MG capsule Commonly known as:  LYRICA Take 100 mg by mouth 3 (three) times daily.   traMADol 50 MG tablet Commonly known as:  ULTRAM Take 1 tablet (50 mg total) by mouth every 6 (six) hours as needed.      Follow-up Information    Coletta Memosabbell, Oanh Devivo, MD Follow up in 3 week(s).   Specialty:  Neurosurgery Why:  please call the office to make an appointment Contact information: [address] [place] [address] [phone]           Signed: Carmela [name],Debra [name] L 05/19/2017, 10:28 AM

## 2017-05-19 NOTE — Anesthesia Postprocedure Evaluation (Signed)
Anesthesia Post Note  Patient: Debra [name]  Procedure(s) Performed: Procedure(s) (LRB): ACDF - C6-C7 (N/A)     Patient location during evaluation: PACU Anesthesia Type: General Level of consciousness: awake and alert Pain management: pain level controlled Vital Signs Assessment: post-procedure vital signs reviewed and stable Respiratory status: spontaneous breathing, nonlabored ventilation, respiratory function stable and patient connected to nasal cannula oxygen Cardiovascular status: blood pressure returned to baseline and stable Postop Assessment: no signs of nausea or vomiting Anesthetic complications: no    Last Vitals:  Vitals:   05/19/17 0117 05/19/17 0507  BP: 110/60 116/68  Pulse: 89 88  Resp: 18 20  Temp: 36.4 C 36.7 C    Last Pain:  Vitals:   05/19/17 0507  TempSrc: Oral  PainSc:                  Kennieth RadFitzgerald, Siah Steely E

## 2017-05-19 NOTE — Progress Notes (Signed)
Patient complained of a 7/10 pain and asked for nicotine patches. Team notified. New order given for pain medication. MD. Fredna DowSays "I will not prescribe nicotine patches at 1:49am". Will continue with cares.

## 2017-05-19 NOTE — Plan of Care (Signed)
Problem: Safety: Goal: Ability to remain free from injury will improve Outcome: Progressing Patient ambulated several times to bathroom and back to bed free of injuries this shift. Patient encouraged to call staff for supervision with ambulation. Patient compliant.   Problem: Health Behavior/Discharge Planning: Goal: Ability to manage health-related needs will improve Outcome: Progressing Patient encouraged to quit smoking. Patient is receptive to smoke cessation teaching and asked for nicotine patches. Will continue to encourage and educate.   Problem: Pain Managment: Goal: General experience of comfort will improve Outcome: Progressing Patient pain managed with PRN tramadol. Patient dissatisfied. Tean notified. New order for Percocet 5/325 given. Patient satisfied. Will continue with cares.

## 2017-05-19 NOTE — Progress Notes (Signed)
Pt ambulates with staff standby assist. Gait steady. No noted distress. Surgical site clean, dry and intact. Will continue to monitor.

## 2017-05-19 NOTE — Progress Notes (Signed)
Pt discharging at this time with family taking all personal belongings. Discharge instructions provided with prescriptions with verbal understanding. Pt will follow up with Md.

## 2017-05-19 NOTE — Progress Notes (Signed)
Patient  admitted to 5C18 from PACU;. HD# 1 POD#0; s/p anterior cervical decompression C6/7. Patient complained of no pain. Patient oriented to room  and made comfortable in bed with call light within reach; incentive spirometer teaching and teach back with demonstration done;  placed on falls precaution; Will continue to monitor.

## 2017-05-19 NOTE — Discharge Instructions (Signed)

## 2017-06-03 ENCOUNTER — Encounter (HOSPITAL_COMMUNITY): Payer: Self-pay | Admitting: Neurosurgery

## 2017-07-12 ENCOUNTER — Encounter (HOSPITAL_COMMUNITY): Payer: Self-pay

## 2017-07-12 ENCOUNTER — Observation Stay (HOSPITAL_COMMUNITY): Payer: Medicaid Other

## 2017-07-12 ENCOUNTER — Inpatient Hospital Stay (HOSPITAL_COMMUNITY)
Admission: EM | Admit: 2017-07-12 | Discharge: 2017-07-14 | DRG: 392 | Disposition: A | Discharge status: Home / Self Care | Payer: Medicaid Other | Attending: Nephrology | Admitting: Nephrology

## 2017-07-12 ENCOUNTER — Emergency Department (HOSPITAL_COMMUNITY): Payer: Medicaid Other

## 2017-07-12 ENCOUNTER — Encounter: Payer: Self-pay | Admitting: Gastroenterology

## 2017-07-12 DIAGNOSIS — R110 Nausea: Secondary | ICD-10-CM | POA: Diagnosis not present

## 2017-07-12 DIAGNOSIS — K3189 Other diseases of stomach and duodenum: Secondary | ICD-10-CM | POA: Diagnosis present

## 2017-07-12 DIAGNOSIS — R1013 Epigastric pain: Secondary | ICD-10-CM | POA: Diagnosis present

## 2017-07-12 DIAGNOSIS — R9431 Abnormal electrocardiogram [ECG] [EKG]: Secondary | ICD-10-CM | POA: Diagnosis not present

## 2017-07-12 DIAGNOSIS — E876 Hypokalemia: Secondary | ICD-10-CM | POA: Diagnosis not present

## 2017-07-12 DIAGNOSIS — G8929 Other chronic pain: Secondary | ICD-10-CM | POA: Diagnosis present

## 2017-07-12 DIAGNOSIS — Z885 Allergy status to narcotic agent status: Secondary | ICD-10-CM

## 2017-07-12 DIAGNOSIS — Z981 Arthrodesis status: Secondary | ICD-10-CM

## 2017-07-12 DIAGNOSIS — M50223 Other cervical disc displacement at C6-C7 level: Secondary | ICD-10-CM | POA: Diagnosis present

## 2017-07-12 DIAGNOSIS — F419 Anxiety disorder, unspecified: Secondary | ICD-10-CM | POA: Diagnosis not present

## 2017-07-12 DIAGNOSIS — Z91040 Latex allergy status: Secondary | ICD-10-CM

## 2017-07-12 DIAGNOSIS — R7989 Other specified abnormal findings of blood chemistry: Secondary | ICD-10-CM

## 2017-07-12 DIAGNOSIS — Z888 Allergy status to other drugs, medicaments and biological substances status: Secondary | ICD-10-CM

## 2017-07-12 DIAGNOSIS — M549 Dorsalgia, unspecified: Secondary | ICD-10-CM | POA: Diagnosis present

## 2017-07-12 DIAGNOSIS — R748 Abnormal levels of other serum enzymes: Secondary | ICD-10-CM | POA: Diagnosis not present

## 2017-07-12 DIAGNOSIS — R933 Abnormal findings on diagnostic imaging of other parts of digestive tract: Secondary | ICD-10-CM

## 2017-07-12 DIAGNOSIS — Z8711 Personal history of peptic ulcer disease: Secondary | ICD-10-CM

## 2017-07-12 DIAGNOSIS — Z79899 Other long term (current) drug therapy: Secondary | ICD-10-CM

## 2017-07-12 DIAGNOSIS — E860 Dehydration: Secondary | ICD-10-CM | POA: Diagnosis present

## 2017-07-12 DIAGNOSIS — F418 Other specified anxiety disorders: Secondary | ICD-10-CM | POA: Diagnosis present

## 2017-07-12 DIAGNOSIS — K219 Gastro-esophageal reflux disease without esophagitis: Secondary | ICD-10-CM | POA: Diagnosis not present

## 2017-07-12 DIAGNOSIS — M5080 Other cervical disc disorders, unspecified cervical region: Secondary | ICD-10-CM | POA: Diagnosis present

## 2017-07-12 DIAGNOSIS — K2960 Other gastritis without bleeding: Principal | ICD-10-CM | POA: Diagnosis present

## 2017-07-12 DIAGNOSIS — R072 Precordial pain: Secondary | ICD-10-CM | POA: Diagnosis present

## 2017-07-12 DIAGNOSIS — F410 Panic disorder [episodic paroxysmal anxiety] without agoraphobia: Secondary | ICD-10-CM | POA: Diagnosis present

## 2017-07-12 DIAGNOSIS — R778 Other specified abnormalities of plasma proteins: Secondary | ICD-10-CM | POA: Diagnosis present

## 2017-07-12 LAB — COMPREHENSIVE METABOLIC PANEL
ALBUMIN: 3.8 g/dL (ref 3.5–5.0)
ALT: 13 U/L — ABNORMAL LOW (ref 14–54)
AST: 20 U/L (ref 15–41)
Alkaline Phosphatase: 107 U/L (ref 38–126)
Anion gap: 12 (ref 5–15)
BUN: <5 mg/dL — ABNORMAL LOW (ref 6–20)
CO2: 26 mmol/L (ref 22–32)
Calcium: 9.1 mg/dL (ref 8.9–10.3)
Chloride: 102 mmol/L (ref 101–111)
Creatinine, Ser: 0.65 mg/dL (ref 0.44–1.00)
GFR calc Af Amer: >60 mL/min (ref >60)
GFR calc non Af Amer: >60 mL/min (ref >60)
GLUCOSE: 109 mg/dL — AB (ref 65–99)
POTASSIUM: 2.9 mmol/L — AB (ref 3.5–5.1)
SODIUM: 140 mmol/L (ref 135–145)
Total Bilirubin: 0.7 mg/dL (ref 0.3–1.2)
Total Protein: 7 g/dL (ref 6.5–8.1)

## 2017-07-12 LAB — URINALYSIS, ROUTINE W REFLEX MICROSCOPIC
BILIRUBIN URINE: NEGATIVE
GLUCOSE, UA: NEGATIVE mg/dL
HGB URINE DIPSTICK: NEGATIVE
Ketones, ur: 5 mg/dL — AB
Leukocytes, UA: NEGATIVE
Nitrite: NEGATIVE
PH: 8 (ref 5.0–8.0)
Protein, ur: NEGATIVE mg/dL
SPECIFIC GRAVITY, URINE: 1.005 (ref 1.005–1.030)

## 2017-07-12 LAB — D-DIMER, QUANTITATIVE: D-Dimer, Quant: <0.27 ug/mL-FEU (ref 0.00–0.50)

## 2017-07-12 LAB — CBC
HEMATOCRIT: 44.8 % (ref 36.0–46.0)
HEMOGLOBIN: 14.8 g/dL (ref 12.0–15.0)
MCH: 30.3 pg (ref 26.0–34.0)
MCHC: 33 g/dL (ref 30.0–36.0)
MCV: 91.6 fL (ref 78.0–100.0)
Platelets: 398 10*3/uL (ref 150–400)
RBC: 4.89 MIL/uL (ref 3.87–5.11)
RDW: 14.8 % (ref 11.5–15.5)
WBC: 10.5 10*3/uL (ref 4.0–10.5)

## 2017-07-12 LAB — I-STAT TROPONIN, ED: Troponin i, poc: 0.1 ng/mL (ref 0.00–0.08)

## 2017-07-12 LAB — TROPONIN I: Troponin I: <0.03 ng/mL (ref <0.03)

## 2017-07-12 LAB — I-STAT CG4 LACTIC ACID, ED: LACTIC ACID, VENOUS: 1.16 mmol/L (ref 0.5–1.9)

## 2017-07-12 LAB — LIPASE, BLOOD: Lipase: 23 U/L (ref 11–51)

## 2017-07-12 LAB — MAGNESIUM: MAGNESIUM: 1.8 mg/dL (ref 1.7–2.4)

## 2017-07-12 MED ORDER — DIAZEPAM 5 MG PO TABS
5.0000 mg | ORAL_TABLET | Freq: Once | ORAL | Status: AC
Start: 2017-07-12 — End: 2017-07-12
  Administered 2017-07-12: 5 mg via ORAL
  Filled 2017-07-12: qty 1

## 2017-07-12 MED ORDER — IOPAMIDOL (ISOVUE-300) INJECTION 61%
100.0000 mL | Freq: Once | INTRAVENOUS | Status: AC | PRN
  Administered 2017-07-12: 100 mL via INTRAVENOUS

## 2017-07-12 MED ORDER — ONDANSETRON HCL 4 MG/2ML IJ SOLN
4.0000 mg | Freq: Once | INTRAMUSCULAR | Status: AC
  Administered 2017-07-12: 4 mg via INTRAVENOUS
  Filled 2017-07-12: qty 2

## 2017-07-12 MED ORDER — ACETAMINOPHEN 650 MG RE SUPP: 650.0000 mg | Freq: Four times a day (QID) | RECTAL | Status: DC | PRN

## 2017-07-12 MED ORDER — SODIUM CHLORIDE 0.9 % IV BOLUS (SEPSIS)
1000.0000 mL | Freq: Once | INTRAVENOUS | Status: AC
  Administered 2017-07-12: 1000 mL via INTRAVENOUS

## 2017-07-12 MED ORDER — POTASSIUM CHLORIDE CRYS ER 20 MEQ PO TBCR
40.0000 meq | EXTENDED_RELEASE_TABLET | Freq: Once | ORAL | Status: AC
  Administered 2017-07-12: 40 meq via ORAL
  Filled 2017-07-12: qty 2

## 2017-07-12 MED ORDER — POTASSIUM CHLORIDE IN NACL 40-0.9 MEQ/L-% IV SOLN
INTRAVENOUS | Status: DC
  Administered 2017-07-12 – 2017-07-13 (×2): 125 mL/h via INTRAVENOUS
  Filled 2017-07-12 (×3): qty 1000

## 2017-07-12 MED ORDER — PREGABALIN 100 MG PO CAPS
100.0000 mg | ORAL_CAPSULE | Freq: Three times a day (TID) | ORAL | Status: DC
  Administered 2017-07-12 – 2017-07-14 (×5): 100 mg via ORAL
  Filled 2017-07-12 (×5): qty 1

## 2017-07-12 MED ORDER — ACETAMINOPHEN 325 MG PO TABS: 650.0000 mg | ORAL_TABLET | Freq: Four times a day (QID) | ORAL | Status: DC | PRN

## 2017-07-12 MED ORDER — BACLOFEN 20 MG PO TABS
20.0000 mg | ORAL_TABLET | Freq: Three times a day (TID) | ORAL | Status: DC
  Administered 2017-07-12 – 2017-07-14 (×5): 20 mg via ORAL
  Filled 2017-07-12 (×5): qty 1

## 2017-07-12 MED ORDER — POTASSIUM CHLORIDE 10 MEQ/100ML IV SOLN
10.0000 meq | INTRAVENOUS | Status: DC
  Administered 2017-07-12: 10 meq via INTRAVENOUS
  Filled 2017-07-12: qty 100

## 2017-07-12 MED ORDER — NICOTINE 14 MG/24HR TD PT24
14.0000 mg | MEDICATED_PATCH | Freq: Once | TRANSDERMAL | Status: AC
  Administered 2017-07-12: 14 mg via TRANSDERMAL
  Filled 2017-07-12: qty 1

## 2017-07-12 MED ORDER — DIAZEPAM 5 MG PO TABS
10.0000 mg | ORAL_TABLET | Freq: Four times a day (QID) | ORAL | Status: DC | PRN
  Administered 2017-07-13: 10 mg via ORAL
  Filled 2017-07-12: qty 2

## 2017-07-12 MED ORDER — ENOXAPARIN SODIUM 40 MG/0.4ML ~~LOC~~ SOLN
40.0000 mg | SUBCUTANEOUS | Status: DC
  Administered 2017-07-12: 40 mg via SUBCUTANEOUS
  Filled 2017-07-12: qty 0.4

## 2017-07-12 NOTE — ED Notes (Signed)
Admitting MD at bedside.

## 2017-07-12 NOTE — ED Provider Notes (Signed)
[facility]-EMERGENCY DEPT Provider Note   CSN: [id number] Arrival date & time: 07/12/17  1145     History   Chief Complaint Chief Complaint  Patient presents with  . Abdominal Pain    HPI Debra [name] is a 51 y.o. female.  The history is provided by the patient.  Chest Pain   This is a new problem. The current episode started more than 1 week ago. The problem occurs constantly. The problem has not changed since onset.The pain is associated with exertion. The pain is present in the substernal region. The pain is at a severity of 8/10. The pain is severe. The quality of the pain is described as sharp. The pain radiates to the epigastrium. Associated symptoms include abdominal pain, cough, diaphoresis, malaise/fatigue, nausea, shortness of breath and vomiting. Pertinent negatives include no back pain, no fever, no headaches, no numbness, no palpitations and no sputum production. She has tried nothing for the symptoms. The treatment provided no relief.    Past Medical History:  Diagnosis Date  . Anxiety    panic attack  . Dyspnea    with exertion  . GERD (gastroesophageal reflux disease)   . History of bleeding ulcers 1994  . Pneumonia 2013    Patient Active Problem List   Diagnosis Date Noted  . HNP (herniated nucleus pulposus), cervical 05/18/2017    Past Surgical History:  Procedure Laterality Date  . ABDOMINAL HYSTERECTOMY    . ANTERIOR CERVICAL DECOMP/DISCECTOMY FUSION N/A 05/18/2017   Procedure: ACDF - C6-C7;  Surgeon: Coletta Memos, MD;  Location: [facility] OR;  Service: Neurosurgery;  Laterality: N/A;  . BACK SURGERY     L 6 Fusion  . TONSILLECTOMY    . UPPER GASTROINTESTINAL ENDOSCOPY  1994    OB History    No data available       Home Medications    Prior to Admission medications   Medication Sig Start Date End Date Taking? Authorizing Provider  baclofen (LIORESAL) 20 MG tablet Take 20 mg by mouth 3 (three) times daily.    [provider]  citalopram  (CELEXA) 40 MG tablet Take 40 mg by mouth every evening.    [provider]  dexlansoprazole (DEXILANT) 60 MG capsule Take 60 mg by mouth daily.    [provider]  diazepam (VALIUM) 5 MG tablet Take 1 tablet (5 mg total) by mouth 2 (two) times daily. 05/06/17   Audry Pili, PA-C  pregabalin (LYRICA) 100 MG capsule Take 100 mg by mouth 3 (three) times daily.    [provider]  traMADol (ULTRAM) 50 MG tablet Take 1 tablet (50 mg total) by mouth every 6 (six) hours as needed. 05/19/17   Coletta Memos, MD    Family History No family history on file.  Social History Social History  Substance Use Topics  . Smoking status: Current Every Day Smoker    Packs/day: 0.50    Years: 31.00  . Smokeless tobacco: Never Used  . Alcohol use No     Allergies   Cefaclor; Sertraline; Amoxicillin-pot clavulanate; Codeine; and Latex   Review of Systems Review of Systems  Constitutional: Positive for chills, diaphoresis, fatigue and malaise/fatigue. Negative for fever.  HENT: Positive for sore throat. Negative for congestion.   Eyes: Negative for visual disturbance.  Respiratory: Positive for cough, chest tightness and shortness of breath. Negative for sputum production and wheezing.   Cardiovascular: Positive for chest pain. Negative for palpitations and leg swelling.  Gastrointestinal: Positive for abdominal pain, diarrhea,  nausea and vomiting. Negative for constipation.  Genitourinary: Negative for dysuria and flank pain.  Musculoskeletal: Negative for back pain, neck pain and neck stiffness.  Skin: Negative for rash and wound.  Neurological: Negative for light-headedness, numbness and headaches.  Psychiatric/Behavioral: Negative for agitation.  All other systems reviewed and are negative.    Physical Exam Updated Vital Signs BP 125/76 (BP Location: Right Arm)   Pulse (!) 108   Temp 98.7 F (37.1 C) (Oral)   Resp 16   Ht 5\' 6"  (1.676 m)   Wt 72.6 kg (160 lb)    SpO2 97%   BMI 25.82 kg/m   Physical Exam  Constitutional: She appears well-developed and well-nourished. No distress.  HENT:  Head: Normocephalic and atraumatic.  Mouth/Throat: Oropharynx is clear and moist. No oropharyngeal exudate.  Eyes: Pupils are equal, round, and reactive to light. Conjunctivae and EOM are normal.  Neck: Normal range of motion. Neck supple.  Cardiovascular: Normal rate and intact distal pulses.  Exam reveals no gallop.   No murmur heard. Pulmonary/Chest: Effort normal and breath sounds normal. No stridor. No respiratory distress. She exhibits no tenderness.  Abdominal: Soft. There is no tenderness. There is no guarding.  Musculoskeletal: She exhibits no edema or tenderness.  Neurological: She is alert. No sensory deficit. She exhibits normal muscle tone.  Skin: Skin is warm and dry. No rash noted. No erythema.  Psychiatric: She has a normal mood and affect.  Nursing note and vitals reviewed.    ED Treatments / Results  Labs (all labs ordered are listed, but only abnormal results are displayed) Labs Reviewed  COMPREHENSIVE METABOLIC PANEL - Abnormal; Notable for the following:       Result Value   Potassium 2.9 (*)    Glucose, Bld 109 (*)    BUN <5 (*)    ALT 13 (*)    All other components within normal limits  URINALYSIS, ROUTINE W REFLEX MICROSCOPIC - Abnormal; Notable for the following:    Ketones, ur 5 (*)    All other components within normal limits  I-STAT TROPONIN, ED - Abnormal; Notable for the following:    Troponin i, poc 0.10 (*)    All other components within normal limits  LIPASE, BLOOD  CBC  TROPONIN I  D-DIMER, QUANTITATIVE (NOT AT [facility])  MAGNESIUM  HIV ANTIBODY (ROUTINE TESTING)  BASIC METABOLIC PANEL  CBC  TROPONIN I  I-STAT CG4 LACTIC ACID, ED    EKG  EKG Interpretation  Date/Time:  Tuesday July 12 2017 16:54:44 EDT Ventricular Rate:  86 PR Interval:    QRS Duration: 51 QT Interval:  436 QTC Calculation: 522 R  Axis:   87 Text Interpretation:  Sinus rhythm Ventricular premature complex Nonspecific T abnormalities, anterior leads Prolonged QT interval Baseline wander in lead(s) V4 No prior ECG for comparison.  Long QTc, No STEMI Confirmed by Theda Belfast ([id number]) on 07/12/2017 7:02:24 PM       Radiology Dg Chest 2 View  Result Date: 07/12/2017 CLINICAL DATA:  Nausea for 6 weeks EXAM: CHEST  2 VIEW COMPARISON:  None. FINDINGS: Cervical spine hardware. No acute consolidation or pleural effusion. Normal cardiomediastinal silhouette. No pneumothorax. IMPRESSION: No active cardiopulmonary disease. Electronically Signed   By: Jasmine Pang M.D.   On: 07/12/2017 18:06   Ct Abdomen Pelvis W Contrast  Result Date: 07/12/2017 CLINICAL DATA:  Chronic nausea and vomiting.  Initial encounter. EXAM: CT ABDOMEN AND PELVIS WITH CONTRAST TECHNIQUE: Multidetector CT imaging of the abdomen and pelvis  was performed using the standard protocol following bolus administration of intravenous contrast. CONTRAST:  ISOVUE-300 IOPAMIDOL (ISOVUE-300) INJECTION 61% COMPARISON:  None. FINDINGS: Lower chest: Minimal bibasilar atelectasis or scarring is noted. The visualized portions of the mediastinum are unremarkable. Hepatobiliary: The liver is unremarkable in appearance. The gallbladder is unremarkable in appearance. The common bile duct remains normal in caliber. Pancreas: The pancreas is within normal limits. Spleen: The spleen is unremarkable in appearance. Adrenals/Urinary Tract: The adrenal glands are unremarkable in appearance. The kidneys are within normal limits. There is no evidence of hydronephrosis. No renal or ureteral stones are identified. No perinephric stranding is seen. Stomach/Bowel: There is question of mild wall thickening at the antrum of the stomach. Would correlate with the patient's symptoms, and consider endoscopy as deemed clinically appropriate. The small bowel is within normal limits. The appendix is not  visualized; there is no evidence for appendicitis. Mild diverticulosis is noted at the sigmoid colon, without evidence of diverticulitis. Vascular/Lymphatic: Scattered calcification is seen along the abdominal aorta and its branches. The abdominal aorta is otherwise grossly unremarkable. The inferior vena cava is grossly unremarkable. No retroperitoneal lymphadenopathy is seen. No pelvic sidewall lymphadenopathy is identified. Reproductive: The bladder is mildly distended and grossly unremarkable. The patient is status post hysterectomy. No suspicious adnexal masses are seen. The ovaries are relatively symmetric. Other: No additional soft tissue abnormalities are seen. Musculoskeletal: No acute osseous abnormalities are identified. The patient is status post anterior lumbosacral spinal fusion at L5-S1. The visualized musculature is unremarkable in appearance. IMPRESSION: 1. Question of mild wall thickening at the antrum of the stomach. Would correlate with the patient's symptoms, and consider endoscopy as deemed clinically appropriate. 2. Scattered aortic atherosclerosis. Electronically Signed   By: Roanna Raider M.D.   On: 07/12/2017 22:56    Procedures Procedures (including critical care time)  Medications Ordered in ED Medications  nicotine (NICODERM CQ - dosed in mg/24 hours) patch 14 mg (14 mg Transdermal Patch Applied 07/12/17 2306)  baclofen (LIORESAL) tablet 20 mg (20 mg Oral Given 07/12/17 2323)  pregabalin (LYRICA) capsule 100 mg (100 mg Oral Given 07/12/17 2323)  enoxaparin (LOVENOX) injection 40 mg (40 mg Subcutaneous Given 07/12/17 2307)  0.9 % NaCl with KCl 40 mEq / L  infusion (125 mL/hr Intravenous New Bag/Given 07/12/17 2345)  acetaminophen (TYLENOL) tablet 650 mg (not administered)    Or  acetaminophen (TYLENOL) suppository 650 mg (not administered)  diazepam (VALIUM) tablet 10 mg (not administered)  sodium chloride 0.9 % bolus 1,000 mL (1,000 mLs Intravenous Rate/Dose Change 07/12/17  2118)  sodium chloride 0.9 % bolus 1,000 mL (0 mLs Intravenous Stopped 07/12/17 2045)  ondansetron (ZOFRAN) injection 4 mg (4 mg Intravenous Given 07/12/17 1719)  diazepam (VALIUM) tablet 5 mg (5 mg Oral Given 07/12/17 1958)  potassium chloride SA (K-DUR,KLOR-CON) CR tablet 40 mEq (40 mEq Oral Given 07/12/17 2306)  iopamidol (ISOVUE-300) 61 % injection 100 mL (100 mLs Intravenous Contrast Given 07/12/17 2226)     Initial Impression / Assessment and Plan / ED Course  I have reviewed the triage vital signs and the nursing notes.  Pertinent labs & imaging results that were available during my care of the patient were reviewed by me and considered in my medical decision making (see chart for details).     Debra [name] is a 51 y.o. female with a past medical history significant for GERD, anxiety, and recent anterior cervical spine surgery on 05/18/17 with neurosurgery who presents with several complaints including neck  swelling, nausea, abdominal pain, diarrhea, and intolerance of oral intake. The patient is accompanied by son. Patient reports that for the last 6 weeks, she has had worsening symptoms of epigastric and left upper quadrant abdominal aching with associated nausea and vomiting. She also reports intermittent diarrhea that she has been taking Imodium for. She says the diarrhea has improved but she is now having no oral intake. She says she is having difficulty tolerating any fluids. She says that she has had no urinary symptoms of frequency, dysuria, or hematuria. She denies any headache or visual changes. She does say that she has been having some neck symptoms with sensation of swelling. She does report that she had this sensation before her surgery. She denies any pain or swelling at the location of the surgical site. No drainage or evidence of cellulitis. Patient says that she is having anxiety from not tolerating any oral intake. She says that over the last several days she has had nausea and  vomiting. She says that she was given antibiotics for a leukocytosis last week but was unsure what she was treating. She says that it did not help her symptoms. She reports some chest tightness with her epigastric pain. She denies any leg pain or leg swelling. She describes her abdominal pain as 8 out 10 severity and dull. It is cramping. Patient says that she has been taking Phenergan at home with brief relief in her symptoms.   Patient was told to come to the emergency department because she is not tolerating any oral intake.  He examined by me. Patient found to have mild epigastric tenderness and left upper quadrant tenderness. Lungs are clear to auscultation. Chest is nontender. Neck is nontender to palpation. No significant limitations in range of motion or tenderness on exam. Oropharyngeal exam unremarkable. No focal neurologic deficits. No lower abdominal tenderness and unremarkable lower extremity exam. No edema.  Given the patient's reported decreased oral intake, suspect dehydration. Patient's lips were dry. Patient was given 2 L of fluid initially. She'll also have laboratory testing to look for electrolyte normality. Patient will have workup to look for occult infection with chest x-ray and urinalysis.  Patient we given for nausea medicine.  Urinalysis shows ketones, likely revealing dehydration. No evidence of UTI. Lipase not elevated. CMP showed hypokalemia with potassium of 2.9. EKG was performed showing evidence of prolonged QTC. Potassium will be repleted through IV since patient is not tolerating oral intake. Normal creatinine. CBC unremarkable.  Dysphagia follow-up on other lab testing including troponin given the chest discomfort. Anticipate reassessment following fluids and rehydration. Given patient's lab  normalities and EKG abnormality, patient may need admission for continued rehydration and management of her nausea and vomiting. If patient continues to have abdominal pain, may  consider CT imaging.   Patient's troponin came back positive. Patient reported that she was continued to have some nausea. Given the patient's positive troponin, EKG of normality, decreased oral intake, and concern for dehydration, hospitalist team will be called. Patient is perseverating on her nausea and concern about her esophagus however, patient was informed that nausea can come from cardiac etiology. Patient agrees with plan for admission and will be admitted to hospitalist service.  Patient was feeling anxious and reported that Valium has helped her. Valium ordered. Nicotine patch also ordered. Patient will be admitted.    Final Clinical Impressions(s) / ED Diagnoses   Final diagnoses:  Precordial chest pain    Clinical Impression: 1. Precordial chest pain  Disposition: Admit to Hospitalist serice    Tegeler, Canary Brimhristopher J, MD 07/13/17 [phone]

## 2017-07-12 NOTE — ED Triage Notes (Signed)
Per Pt, Pt is coming from home with nausea x [redacted] weeks along with abdominal pain and diarrhea. Pt has been being evaluated by PCP and being treated for GERD. Pt reports taking Prilosec 8 weeks ago with no relief. Today, pt was unable to keep anything down and reports swelling on the front of her neck. Imodium has worked for the diarrhea.

## 2017-07-12 NOTE — H&P (Signed)
History and Physical  Patient Name: Debra [name] Dangler     ZOX:[id number]    DOB: 06/30/1966    DOA: 07/12/2017 PCP: System, Pcp Not In  Patient coming from: Home  Chief Complaint: Nausea, epigastric pain      HPI: Debra [name] Code is a 51 y.o. female with a past medical history significant for lumbar and cervical disk disease and GERD who presents with nausea and epigastric pain for 6 weeks.  The patient has several probably disparate complaints, which she conflates.  It appears she was in her usual state of health until about 6 weeks ago when she started to develop persistent nausea and dull epigastric pain.  She has had chronic GERD, which I think was untreated until a few weeks after her surgery, when she was started on Dexilant.  Since starting Dexilant, her nausea began, it was accompanied by daily (2-3 times/day) loose stools, as well as anorexia/dec appetite, frequent eructation and bloating and epigastric discomfort (mild, constant, dull), but never vomiting.  She thought it might be her trazodone, so she stopped it without relief.  She thought it might be her Dexilant, but she didn't stop that to see.  In the meantime, she has also had, ever since her surgery, intermittent chest discomfort, which she describes at "tightness under my ribs".  This is worse with tension or stress (i.e. Ruminating in bed at night), but is also associated with some exertional SOB (e.g while walking around the grocery store).  The other disparate complaints include feeling of throat swelling, feeling of something stuck in her throat, problems with her teeth, hoarse voice.  Of note, she also  ED course: -afebrile, heart rate 114, respirations and pulse ox normal, blood pressure 114/100 -Na 140, K 2.9, Cr 0.65, WBC 12.5K, Hgb 14.3 -Lipase normal -UA with ketones, no pyuria, hematuria -Lactic acid normal -CXR clear -Troponin was obtained and was 0.1 -ECG showed some nonspecific TW changes, no ST changes, no  previous for comparison -She was given IV fluids, Valium for anxiety and TRH were asked to evluate for nausea, epigastric pain and elevated troponin    She is an active smoker, but no HTN, diabetes, previous personal history of CV disease.  Father, grandfather and paternal uncle had heart disease, she does not know ages.     ROS: Review of Systems  Constitutional: Negative for chills, fever and malaise/fatigue.  HENT: Positive for sore throat.   Respiratory: Positive for shortness of breath (exertional). Negative for cough, hemoptysis and sputum production.   Cardiovascular: Positive for chest pain. Negative for palpitations, orthopnea and leg swelling.  Gastrointestinal: Positive for abdominal pain, diarrhea and nausea. Negative for blood in stool, constipation, melena and vomiting.  Musculoskeletal: Positive for back pain (chronic) and neck pain (chronic).  Skin: Negative for rash.  Neurological: Negative for weakness.  All other systems reviewed and are negative.         Past Medical History:  Diagnosis Date  . Anxiety    panic attack  . Dyspnea    with exertion  . GERD (gastroesophageal reflux disease)   . History of bleeding ulcers 1994  . Pneumonia 2013    Past Surgical History:  Procedure Laterality Date  . ABDOMINAL HYSTERECTOMY    . ANTERIOR CERVICAL DECOMP/DISCECTOMY FUSION N/A 05/18/2017   Procedure: ACDF - C6-C7;  Surgeon: Coletta Memosabbell, Kyle, MD;  Location: [facility] OR;  Service: Neurosurgery;  Laterality: N/A;  . BACK SURGERY     L 6 Fusion  . TONSILLECTOMY    .  UPPER GASTROINTESTINAL ENDOSCOPY  1994    Social History: Patient lives in [place] with her husband.  The patient walks unassisted.  She is a Associate Professor.  Active smoker.  No alcohol.  Allergies  Allergen Reactions  . Cefaclor Diarrhea and Rash    hives  . Sertraline     Suicidal thoughts, insomnia, nightmares  . Amoxicillin-Pot Clavulanate Diarrhea    Has patient had a PCN reaction causing  immediate rash, facial/tongue/throat swelling, SOB or lightheadedness with hypotension: No Has patient had a PCN reaction causing severe rash involving mucus membranes or skin necrosis: No Has patient had a PCN reaction that required hospitalization: No Has patient had a PCN reaction occurring within the last 10 years: Yes If all of the above answers are "NO", then may proceed with Cephalosporin use.   . Codeine Nausea And Vomiting    If pt takes a lot of this med  . Latex Itching and Swelling    redness    Family history: family history includes COPD in her mother; Heart attack in her paternal grandfather and paternal uncle; Heart disease in her father; Hypertension in her mother; Ulcers in her father.  Prior to Admission medications   Medication Sig Start Date End Date Taking? Authorizing Provider  baclofen (LIORESAL) 20 MG tablet Take 20 mg by mouth 3 (three) times daily.   Yes [provider]  citalopram (CELEXA) 40 MG tablet Take 40 mg by mouth every evening.   Yes [provider]  dexlansoprazole (DEXILANT) 60 MG capsule Take 60 mg by mouth daily.   Yes [provider]  lidocaine (LIDODERM) 5 % Place 1 patch onto the skin as needed for pain. 05/26/17  Yes [provider]  pregabalin (LYRICA) 100 MG capsule Take 100 mg by mouth 3 (three) times daily.   Yes [provider]  traMADol (ULTRAM) 50 MG tablet Take 1 tablet (50 mg total) by mouth every 6 (six) hours as needed. Patient taking differently: Take 50 mg by mouth every 6 (six) hours as needed for moderate pain.  05/19/17  Yes Coletta Memos, MD       Physical Exam: BP 119/72   Pulse 88   Temp 98.7 F (37.1 C) (Oral)   Resp 17   Ht 5\' 6"  (1.676 m)   Wt 72.6 kg (160 lb)   SpO2 94%   BMI 25.82 kg/m  General appearance: Well-developed, adult female, alert and in mild distress from anxiousness.   Eyes: Anicteric, conjunctiva pink, lids and lashes normal. PERRL.    ENT: No nasal  deformity, discharge, epistaxis.  Hearing normal. OP moist without lesions.   Neck: No neck masses.  Trachea midline.  No thyromegaly/tenderness.  The neck as a whole is mildly red, but there is no swelling, no induration and no pain to palpation, the neck is supple.  The cervical fusion scar is well-healed. Lymph: No cervical or supraclavicular lymphadenopathy. Skin: Warm and dry.  No jaundice.  No suspicious rashes or lesions. Cardiac: RRR, nl S1-S2, no murmurs appreciated.  Capillary refill is brisk.  JVP normale.  No LE edema.  Radial and DP pulses 2+ and symmetric.  No carotid bruits. Respiratory: Normal respiratory rate and rhythm.  CTAB without rales or wheezes. Abdomen: Abdomen soft.  Mild epigastric TTP without guarding or rebound. No ascites, distension, hepatosplenomegaly.   MSK: No deformities or effusions.  No cyanosis or clubbing. Neuro: Cranial nerves normal.  Sensation intact to light touch. Speech is fluent.  Muscle strength  normal.    Psych: Sensorium intact and responding to questions, attention normal.  Behavior appropriate.  Affect anxious.  Judgment and insight appear normal.     Labs on Admission:  I have personally reviewed following labs and imaging studies: CBC:  Recent Labs Lab 07/12/17 1210  WBC 10.5  HGB 14.8  HCT 44.8  MCV 91.6  PLT 398   Basic Metabolic Panel:  Recent Labs Lab 07/12/17 1210  NA 140  K 2.9*  CL 102  CO2 26  GLUCOSE 109*  BUN <5*  CREATININE 0.65  CALCIUM 9.1   GFR: Estimated Creatinine Clearance: 84.8 mL/min (by C-G formula based on SCr of 0.65 mg/dL).  Liver Function Tests:  Recent Labs Lab 07/12/17 1210  AST 20  ALT 13*  ALKPHOS 107  BILITOT 0.7  PROT 7.0  ALBUMIN 3.8    Recent Labs Lab 07/12/17 1210  LIPASE 23   Cardiac Enzymes:  Recent Labs Lab 07/12/17 2052  TROPONINI <0.03   Sepsis Labs: Lactate 1.1       Radiological Exams on Admission: Personally reviewed CXR shows no focal opacity or  edema: Dg Chest 2 View  Result Date: 07/12/2017 CLINICAL DATA:  Nausea for 6 weeks EXAM: CHEST  2 VIEW COMPARISON:  None. FINDINGS: Cervical spine hardware. No acute consolidation or pleural effusion. Normal cardiomediastinal silhouette. No pneumothorax. IMPRESSION: No active cardiopulmonary disease. Electronically Signed   By: Jasmine Pang M.D.   On: 07/12/2017 18:06    EKG: Independently reviewed. Rate 86, QTc 522, TWI nonspecific, no ST changes.  No previous for comparison.      Assessment/Plan Principal Problem:   Nausea Active Problems:   Epigastric pain   GERD (gastroesophageal reflux disease)   Elevated troponin   Hypokalemia   Anxiety   Prolonged QT interval  1. Nausea, epigastric pain:  Clinically this sounds like gastritis, functional abdominal pain or perhaps ulcer. She obviously has bad GERD (hoarse, throat pain, sour mouth, eructation, globus sens, etc).   It has not responded to conservative treatment with PPI, and I am unclear why she thinks the PPI is making her nauseated, have diarrhea and be unable to eat now.  At this point, her PCP and ENT both think she needs GI consult, but she feels unable to eat, dehydrated, and has come to the ER. -CT abdomen and pelvis with IV contrast ordered -Consult to GI -Clears tonight, NPO in AM -IV fluids -Valium only for nausea, until QTc better  -Smoking cessation strongly recommended, modalities discussed   2. Dehydration:  -IV fluids  3. Hypokalemia:  -IVF with K and oral K -Check mag  4. Elevated troponin:  Unclear if this is incidental or if she has real anginal symptoms (she has such a dense constellation of other symptoms).  She has a Heart score of 5-6 given some exertional discomfort, age, smoking, elevated troponin and TW changes. -Trend troponins -NPO in AM -Consult to Cardiology -Obtain echocardiogram  5. Chronic back pain and anxiety:  -Hold SSRI given QT -Continue Lyrica  6. QT elevation:  From SSRI,  electrolyte abnormalities and repeated phenergan/ondansetron use at home. -Valium only for nausea for now -Check mag, replete elecs -Repeat ECG in AM       DVT prophylaxis: Loevnox  Code Status: FULL  Family Communication: Son at bedside  Disposition Plan: Anticipate IV fluids, NPO, consult to GI for nausea.  For troponin, will ask Cadriology to see for risk stratification. Consults called: Cardiology via inbasket Admission status: OBS At  the point of initial evaluation, it is my clinical opinion that admission for OBSERVATION is reasonable and necessary because the patient's presenting complaints in the context of their chronic conditions represent sufficient risk of deterioration or significant morbidity to constitute reasonable grounds for close observation in the hospital setting, but that the patient may be medically stable for discharge from the hospital within 24 to 48 hours.    Medical decision making: Patient seen at 7:45 PM on 07/12/2017.  The patient was discussed with Dr. Rush Landmark.  What exists of the patient's chart was reviewed in depth and summarized above.  Clinical condition: stable.        Alberteen Sam [facility] Pager [phone]

## 2017-07-13 ENCOUNTER — Observation Stay (HOSPITAL_BASED_OUTPATIENT_CLINIC_OR_DEPARTMENT_OTHER): Payer: Medicaid Other

## 2017-07-13 DIAGNOSIS — E876 Hypokalemia: Secondary | ICD-10-CM | POA: Diagnosis not present

## 2017-07-13 DIAGNOSIS — R072 Precordial pain: Secondary | ICD-10-CM | POA: Diagnosis not present

## 2017-07-13 DIAGNOSIS — R9431 Abnormal electrocardiogram [ECG] [EKG]: Secondary | ICD-10-CM | POA: Diagnosis not present

## 2017-07-13 DIAGNOSIS — R748 Abnormal levels of other serum enzymes: Secondary | ICD-10-CM | POA: Diagnosis not present

## 2017-07-13 DIAGNOSIS — R110 Nausea: Secondary | ICD-10-CM | POA: Diagnosis not present

## 2017-07-13 DIAGNOSIS — K219 Gastro-esophageal reflux disease without esophagitis: Secondary | ICD-10-CM | POA: Diagnosis not present

## 2017-07-13 DIAGNOSIS — R1013 Epigastric pain: Secondary | ICD-10-CM | POA: Diagnosis not present

## 2017-07-13 DIAGNOSIS — F419 Anxiety disorder, unspecified: Secondary | ICD-10-CM | POA: Diagnosis not present

## 2017-07-13 LAB — BASIC METABOLIC PANEL
ANION GAP: 9 (ref 5–15)
BUN: <5 mg/dL — ABNORMAL LOW (ref 6–20)
CO2: 24 mmol/L (ref 22–32)
Calcium: 8.2 mg/dL — ABNORMAL LOW (ref 8.9–10.3)
Chloride: 109 mmol/L (ref 101–111)
Creatinine, Ser: 0.57 mg/dL (ref 0.44–1.00)
GFR calc Af Amer: >60 mL/min (ref >60)
GLUCOSE: 91 mg/dL (ref 65–99)
Potassium: 3.2 mmol/L — ABNORMAL LOW (ref 3.5–5.1)
Sodium: 142 mmol/L (ref 135–145)

## 2017-07-13 LAB — ECHOCARDIOGRAM COMPLETE
Height: 66 in
Weight: 2560 oz

## 2017-07-13 LAB — CBC
HCT: 36.8 % (ref 36.0–46.0)
Hemoglobin: 12 g/dL (ref 12.0–15.0)
MCH: 30.5 pg (ref 26.0–34.0)
MCHC: 32.6 g/dL (ref 30.0–36.0)
MCV: 93.6 fL (ref 78.0–100.0)
PLATELETS: 305 10*3/uL (ref 150–400)
RBC: 3.93 MIL/uL (ref 3.87–5.11)
RDW: 15.3 % (ref 11.5–15.5)
WBC: 7.3 10*3/uL (ref 4.0–10.5)

## 2017-07-13 LAB — TROPONIN I: Troponin I: <0.03 ng/mL (ref <0.03)

## 2017-07-13 LAB — HIV ANTIBODY (ROUTINE TESTING W REFLEX): HIV SCREEN 4TH GENERATION: NONREACTIVE

## 2017-07-13 MED ORDER — DIAZEPAM 5 MG PO TABS
5.0000 mg | ORAL_TABLET | Freq: Four times a day (QID) | ORAL | Status: DC | PRN
Start: 2017-07-13 — End: 2017-07-14
  Administered 2017-07-13 – 2017-07-14 (×3): 5 mg via ORAL
  Filled 2017-07-13 (×3): qty 1

## 2017-07-13 MED ORDER — PANTOPRAZOLE SODIUM 40 MG PO TBEC
40.0000 mg | DELAYED_RELEASE_TABLET | Freq: Every day | ORAL | Status: DC
  Administered 2017-07-13 – 2017-07-14 (×2): 40 mg via ORAL
  Filled 2017-07-13 (×2): qty 1

## 2017-07-13 MED ORDER — ENOXAPARIN SODIUM 40 MG/0.4ML ~~LOC~~ SOLN: 40.0000 mg | SUBCUTANEOUS | Status: DC

## 2017-07-13 MED ORDER — POTASSIUM CHLORIDE CRYS ER 20 MEQ PO TBCR
40.0000 meq | EXTENDED_RELEASE_TABLET | Freq: Two times a day (BID) | ORAL | Status: DC
  Administered 2017-07-13 – 2017-07-14 (×3): 40 meq via ORAL
  Filled 2017-07-13 (×3): qty 2

## 2017-07-13 MED ORDER — LOPERAMIDE HCL 2 MG PO CAPS
2.0000 mg | ORAL_CAPSULE | Freq: Three times a day (TID) | ORAL | Status: DC | PRN
  Administered 2017-07-13: 2 mg via ORAL
  Filled 2017-07-13 (×2): qty 1

## 2017-07-13 NOTE — Progress Notes (Signed)
PROGRESS NOTE    Debra [name]  WGN:[id number] DOB: September 27, 1966 DOA: 07/12/2017 PCP: System, Pcp Not In   Brief Narrative: 51 year old female with history of anxiety depression, prolonged QT interval, GERD, lumbar and cervical disc disease presented with nausea vomiting and epigastric pain.  Assessment & Plan:  #Nausea, epigastric pain: Symptoms improved this morning. CT scan of abdomenconsistent with thickened antrum. Patient has history of GERD. -GI consult requested today. Plan for endoscopy tomorrow. Patient reported that she is hungry and wanted to eat today. -Continue supportive care. DC IV fluid per patient's  request  -off dexilant  #Dehydration: Treated with IV fluid. Patient now eating okay and nothing by mouth from midnight. Off IV fluid.  #Hypokalemia: K 3.2 today.repleted potassium chloride. Magnesium level acceptable.  #Mild elevation in troponin: Follow up echocardiogram. Repeat troponin negative. Patient has no chest pain. Cartilage consult requested.  #Chronic back pain and anxiety: Holding SSRI because of prolonged QT. Continue Lyrica.continue baclofen  #Prolonged Qt interval: cardiology consult. Holding QT prolonging medication like celexa.  # anxiety depression: On Celexa at home. Currently on hold because of QT prolongation. We'll ask the cardiology if okay to continue.If not need psychiatric evaluation.  DVT prophylaxis: Lovenox sq holding today because of procedure tomorrow Code Status:full code Family Communication:no family at bedside. Disposition Plan:likely discharge home in 1-2 days  Consultants:   cardiology  Procedures:none Antimicrobials:none  Subjective: Seen and examined at bedside. Denied nausea vomiting. Monitor something. No chest pain or shortness of breath.  Objective: Vitals:   07/12/17 2115 07/12/17 2130 07/12/17 2304 07/13/17 0423  BP: 110/65 119/72 127/73 110/66  Pulse: 83 88 81 83  Resp: 16 17 16 17   Temp:   98.2 F (36.8 C)  98 F (36.7 C)  TempSrc:   Oral Oral  SpO2: 94% 94% 95% 94%  Weight:      Height:        Intake/Output Summary (Last 24 hours) at 07/13/17 1514 Last data filed at 07/13/17 1000  Gross per 24 hour  Intake           1127.5 ml  Output                0 ml  Net           1127.5 ml   Filed Weights   07/12/17 1216  Weight: 72.6 kg (160 lb)    Examination:  General exam: Appears calm and comfortable  Respiratory system: Clear to auscultation. Respiratory effort normal. No wheezing or crackle Cardiovascular system: S1 & S2 heard, RRR.  No pedal edema. Gastrointestinal system: Abdomen is nondistended, soft and nontender. Normal bowel sounds heard. Central nervous system: Alert and oriented. No focal neurological deficits. Extremities: Symmetric 5 x 5 power. Skin: No rashes, lesions or ulcers Psychiatry: Judgement and insight appear normal. Mood & affect appropriate.     Data Reviewed: I have personally reviewed following labs and imaging studies  CBC:  Recent Labs Lab 07/12/17 1210 07/13/17 0608  WBC 10.5 7.3  HGB 14.8 12.0  HCT 44.8 36.8  MCV 91.6 93.6  PLT 398 305   Basic Metabolic Panel:  Recent Labs Lab 07/12/17 1210 07/12/17 2052 07/13/17 0608  NA 140  --  142  K 2.9*  --  3.2*  CL 102  --  109  CO2 26  --  24  GLUCOSE 109*  --  91  BUN <5*  --  <5*  CREATININE 0.65  --  0.57  CALCIUM 9.1  --  8.2*  MG  --  1.8  --    GFR: Estimated Creatinine Clearance: 84.8 mL/min (by C-G formula based on SCr of 0.57 mg/dL). Liver Function Tests:  Recent Labs Lab 07/12/17 1210  AST 20  ALT 13*  ALKPHOS 107  BILITOT 0.7  PROT 7.0  ALBUMIN 3.8    Recent Labs Lab 07/12/17 1210  LIPASE 23   No results for input(s): AMMONIA in the last 168 hours. Coagulation Profile: No results for input(s): INR, PROTIME in the last 168 hours. Cardiac Enzymes:  Recent Labs Lab 07/12/17 2052 07/13/17 0608  TROPONINI <0.03 <0.03   BNP (last 3 results) No results  for input(s): PROBNP in the last 8760 hours. HbA1C: No results for input(s): HGBA1C in the last 72 hours. CBG: No results for input(s): GLUCAP in the last 168 hours. Lipid Profile: No results for input(s): CHOL, HDL, LDLCALC, TRIG, CHOLHDL, LDLDIRECT in the last 72 hours. Thyroid Function Tests: No results for input(s): TSH, T4TOTAL, FREET4, T3FREE, THYROIDAB in the last 72 hours. Anemia Panel: No results for input(s): VITAMINB12, FOLATE, FERRITIN, TIBC, IRON, RETICCTPCT in the last 72 hours. Sepsis Labs:  Recent Labs Lab 07/12/17 1740  LATICACIDVEN 1.16    No results found for this or any previous visit (from the past 240 hour(s)).       Radiology Studies: Dg Chest 2 View  Result Date: 07/12/2017 CLINICAL DATA:  Nausea for 6 weeks EXAM: CHEST  2 VIEW COMPARISON:  None. FINDINGS: Cervical spine hardware. No acute consolidation or pleural effusion. Normal cardiomediastinal silhouette. No pneumothorax. IMPRESSION: No active cardiopulmonary disease. Electronically Signed   By: Jasmine PangKim  Fujinaga M.D.   On: 07/12/2017 18:06   Ct Abdomen Pelvis W Contrast  Result Date: 07/12/2017 CLINICAL DATA:  Chronic nausea and vomiting.  Initial encounter. EXAM: CT ABDOMEN AND PELVIS WITH CONTRAST TECHNIQUE: Multidetector CT imaging of the abdomen and pelvis was performed using the standard protocol following bolus administration of intravenous contrast. CONTRAST:  100mL ISOVUE-300 IOPAMIDOL (ISOVUE-300) INJECTION 61% COMPARISON:  None. FINDINGS: Lower chest: Minimal bibasilar atelectasis or scarring is noted. The visualized portions of the mediastinum are unremarkable. Hepatobiliary: The liver is unremarkable in appearance. The gallbladder is unremarkable in appearance. The common bile duct remains normal in caliber. Pancreas: The pancreas is within normal limits. Spleen: The spleen is unremarkable in appearance. Adrenals/Urinary Tract: The adrenal glands are unremarkable in appearance. The kidneys are  within normal limits. There is no evidence of hydronephrosis. No renal or ureteral stones are identified. No perinephric stranding is seen. Stomach/Bowel: There is question of mild wall thickening at the antrum of the stomach. Would correlate with the patient's symptoms, and consider endoscopy as deemed clinically appropriate. The small bowel is within normal limits. The appendix is not visualized; there is no evidence for appendicitis. Mild diverticulosis is noted at the sigmoid colon, without evidence of diverticulitis. Vascular/Lymphatic: Scattered calcification is seen along the abdominal aorta and its branches. The abdominal aorta is otherwise grossly unremarkable. The inferior vena cava is grossly unremarkable. No retroperitoneal lymphadenopathy is seen. No pelvic sidewall lymphadenopathy is identified. Reproductive: The bladder is mildly distended and grossly unremarkable. The patient is status post hysterectomy. No suspicious adnexal masses are seen. The ovaries are relatively symmetric. Other: No additional soft tissue abnormalities are seen. Musculoskeletal: No acute osseous abnormalities are identified. The patient is status post anterior lumbosacral spinal fusion at L5-S1. The visualized musculature is unremarkable in appearance. IMPRESSION: 1. Question of mild wall thickening at the antrum of the stomach.  Would correlate with the patient's symptoms, and consider endoscopy as deemed clinically appropriate. 2. Scattered aortic atherosclerosis. Electronically Signed   By: Roanna Raider M.D.   On: 07/12/2017 22:56        Scheduled Meds: . baclofen  20 mg Oral TID  . [START ON 07/15/2017] enoxaparin (LOVENOX) injection  40 mg Subcutaneous Q24H  . nicotine  14 mg Transdermal Once  . pantoprazole  40 mg Oral Daily  . pregabalin  100 mg Oral TID   Continuous Infusions:   LOS: 0 days    Vieno Tarrant Jaynie Collins, MD [facility] Pager [phone]  If 7PM-7AM, please contact  night-coverage [email or web address] Password [facility] 07/13/2017, 3:14 PM

## 2017-07-13 NOTE — Consult Note (Signed)
Cardiology Consultation:   Patient ID: Debra [name]; [id number]; 1966-03-04   Admit date: 07/12/2017 Date of Consult: 07/13/2017  Primary Care Provider: System, Pcp Not In Primary Cardiologist: New (Dr. Allyson Sabal) Primary Electrophysiologist:  N/A   Patient Profile:   Debra [name] is a 51 y.o. female with no previous cardiac history but PMH of GERD, hiatal hernia, PUD with prior h/o bleeding ulcers, lumbar and cervical disk disease and h/o tobacco abuse, who presented overnight with complaints of nausea and epigastric pain for the last 6 weeks. Pt is being seen today for the evaluation of chest pain,  at the request of Dr. Ronalee Belts, Internal Medicine.  History of Present Illness:   As outline above, the patient does not have any prior cardiac history. Her only risk factor is tobacco abuse. She notes 30 + year h/o tobacco use. Smokes 1 ppd. She denies h/o DM, HTN or HLD. She has h/o heart disease in distant relatives but no first degree relatives.  In June, she underwent cervical disc surgery. Following surgery, she developed GI symptoms, mainly nausea, diarrhea and some vomiting. This has been an ongoing issues since her surgery. She has also had some substernal chest tightness. She believes this is anxiety related as she has had some occurrence while under stress/ panic attacks. However she also endorses some exertional dyspnea and chest tightness when walking up stairs. This has been going on since May.   Given her ongoing symptoms, she presented to the ED last night with CC of nausea and epigastric pain. Lipase is negative. D-dimer is negative. POC troponin in the ED was abnormal at 0.10, however actual lab troponins have been negative x 2 at <0.03. EKG shows SR with PVCs. No ischemic abnormalities. K 3.2.   She his currently pain free. She has been seen by GI and plan is for EGD tomorrow.    Past Medical History:  Diagnosis Date  . Anxiety    panic attack  . Dyspnea    with  exertion  . GERD (gastroesophageal reflux disease)   . History of bleeding ulcers 1994  . Pneumonia 2013    Past Surgical History:  Procedure Laterality Date  . ABDOMINAL HYSTERECTOMY    . ANTERIOR CERVICAL DECOMP/DISCECTOMY FUSION N/A 05/18/2017   Procedure: ACDF - C6-C7;  Surgeon: Coletta Memos, MD;  Location: [facility] OR;  Service: Neurosurgery;  Laterality: N/A;  . BACK SURGERY     L 6 Fusion  . TONSILLECTOMY    . UPPER GASTROINTESTINAL ENDOSCOPY  1994     Inpatient Medications: Scheduled Meds: . baclofen  20 mg Oral TID  . enoxaparin (LOVENOX) injection  40 mg Subcutaneous Q24H  . nicotine  14 mg Transdermal Once  . pregabalin  100 mg Oral TID   Continuous Infusions:  PRN Meds: acetaminophen **OR** acetaminophen, diazepam  Allergies:    Allergies  Allergen Reactions  . Cefaclor Diarrhea and Rash    hives  . Sertraline     Suicidal thoughts, insomnia, nightmares  . Amoxicillin-Pot Clavulanate Diarrhea    Has patient had a PCN reaction causing immediate rash, facial/tongue/throat swelling, SOB or lightheadedness with hypotension: No Has patient had a PCN reaction causing severe rash involving mucus membranes or skin necrosis: No Has patient had a PCN reaction that required hospitalization: No Has patient had a PCN reaction occurring within the last 10 years: Yes If all of the above answers are "NO", then may proceed with Cephalosporin use.   . Codeine Nausea And Vomiting  If pt takes a lot of this med  . Latex Itching and Swelling    redness    Social History:   Social History   Social History  . Marital status: Divorced    Spouse name: N/A  . Number of children: N/A  . Years of education: N/A   Occupational History  . Not on file.   Social History Main Topics  . Smoking status: Current Every Day Smoker    Packs/day: 0.50    Years: 31.00  . Smokeless tobacco: Never Used  . Alcohol use No  . Drug use: No  . Sexual activity: Not on file   Other Topics  Concern  . Not on file   Social History Narrative  . No narrative on file    Family History:    Family History  Problem Relation Age of Onset  . Hypertension Mother   . COPD Mother   . Ulcers Father   . Heart disease Father   . Heart attack Paternal Uncle   . Heart attack Paternal Grandfather      ROS:  Please see the history of present illness.  Review of Systems  Cardiovascular: Positive for chest pain and dyspnea on exertion.  Gastrointestinal: Positive for abdominal pain, diarrhea, nausea and vomiting.    All other ROS reviewed and negative.     Physical Exam/Data:   Vitals:   07/12/17 2115 07/12/17 2130 07/12/17 2304 07/13/17 0423  BP: 110/65 119/72 127/73 110/66  Pulse: 83 88 81 83  Resp: 16 17 16 17   Temp:   98.2 F (36.8 C) 98 F (36.7 C)  TempSrc:   Oral Oral  SpO2: 94% 94% 95% 94%  Weight:      Height:        Intake/Output Summary (Last 24 hours) at 07/13/17 1029 Last data filed at 07/13/17 0651  Gross per 24 hour  Intake            887.5 ml  Output                0 ml  Net            887.5 ml   Filed Weights   07/12/17 1216  Weight: 160 lb (72.6 kg)   Body mass index is 25.82 kg/m.  General:  Well nourished, well developed, in no acute distress HEENT: normal Lymph: no adenopathy Neck: no JVD Endocrine:  No thryomegaly Vascular: No carotid bruits; FA pulses 2+ bilaterally without bruits  Cardiac:  normal S1, S2; RRR; no murmur  Lungs:  clear to auscultation bilaterally, no wheezing, rhonchi or rales  Abd: soft, nontender, no hepatomegaly  Ext: no edema Musculoskeletal:  No deformities, BUE and BLE strength normal and equal Skin: warm and dry  Neuro:  CNs 2-12 intact, no focal abnormalities noted Psych:  Normal affect   EKG:  The EKG was personally reviewed and demonstrates:  NSR with PVC.  Telemetry:  Telemetry was personally reviewed and demonstrates:  NSR   Relevant CV Studies: 2D Echo pending   Laboratory  Data:  Chemistry Recent Labs Lab 07/12/17 1210 07/13/17 0608  NA 140 142  K 2.9* 3.2*  CL 102 109  CO2 26 24  GLUCOSE 109* 91  BUN <5* <5*  CREATININE 0.65 0.57  CALCIUM 9.1 8.2*  GFRNONAA >60 >60  GFRAA >60 >60  ANIONGAP 12 9     Recent Labs Lab 07/12/17 1210  PROT 7.0  ALBUMIN 3.8  AST 20  ALT  13*  ALKPHOS 107  BILITOT 0.7   Hematology Recent Labs Lab 07/12/17 1210 07/13/17 0608  WBC 10.5 7.3  RBC 4.89 3.93  HGB 14.8 12.0  HCT 44.8 36.8  MCV 91.6 93.6  MCH 30.3 30.5  MCHC 33.0 32.6  RDW 14.8 15.3  PLT 398 305   Cardiac Enzymes Recent Labs Lab 07/12/17 2052 07/13/17 0608  TROPONINI <0.03 <0.03    Recent Labs Lab 07/12/17 1738  TROPIPOC 0.10*    BNPNo results for input(s): BNP, PROBNP in the last 168 hours.  DDimer  Recent Labs Lab 07/12/17 2052  DDIMER <0.27    Radiology/Studies:  Dg Chest 2 View  Result Date: 07/12/2017 CLINICAL DATA:  Nausea for 6 weeks EXAM: CHEST  2 VIEW COMPARISON:  None. FINDINGS: Cervical spine hardware. No acute consolidation or pleural effusion. Normal cardiomediastinal silhouette. No pneumothorax. IMPRESSION: No active cardiopulmonary disease. Electronically Signed   By: Jasmine PangKim  Fujinaga M.D.   On: 07/12/2017 18:06   Ct Abdomen Pelvis W Contrast  Result Date: 07/12/2017 CLINICAL DATA:  Chronic nausea and vomiting.  Initial encounter. EXAM: CT ABDOMEN AND PELVIS WITH CONTRAST TECHNIQUE: Multidetector CT imaging of the abdomen and pelvis was performed using the standard protocol following bolus administration of intravenous contrast. CONTRAST:  100mL ISOVUE-300 IOPAMIDOL (ISOVUE-300) INJECTION 61% COMPARISON:  None. FINDINGS: Lower chest: Minimal bibasilar atelectasis or scarring is noted. The visualized portions of the mediastinum are unremarkable. Hepatobiliary: The liver is unremarkable in appearance. The gallbladder is unremarkable in appearance. The common bile duct remains normal in caliber. Pancreas: The pancreas is  within normal limits. Spleen: The spleen is unremarkable in appearance. Adrenals/Urinary Tract: The adrenal glands are unremarkable in appearance. The kidneys are within normal limits. There is no evidence of hydronephrosis. No renal or ureteral stones are identified. No perinephric stranding is seen. Stomach/Bowel: There is question of mild wall thickening at the antrum of the stomach. Would correlate with the patient's symptoms, and consider endoscopy as deemed clinically appropriate. The small bowel is within normal limits. The appendix is not visualized; there is no evidence for appendicitis. Mild diverticulosis is noted at the sigmoid colon, without evidence of diverticulitis. Vascular/Lymphatic: Scattered calcification is seen along the abdominal aorta and its branches. The abdominal aorta is otherwise grossly unremarkable. The inferior vena cava is grossly unremarkable. No retroperitoneal lymphadenopathy is seen. No pelvic sidewall lymphadenopathy is identified. Reproductive: The bladder is mildly distended and grossly unremarkable. The patient is status post hysterectomy. No suspicious adnexal masses are seen. The ovaries are relatively symmetric. Other: No additional soft tissue abnormalities are seen. Musculoskeletal: No acute osseous abnormalities are identified. The patient is status post anterior lumbosacral spinal fusion at L5-S1. The visualized musculature is unremarkable in appearance. IMPRESSION: 1. Question of mild wall thickening at the antrum of the stomach. Would correlate with the patient's symptoms, and consider endoscopy as deemed clinically appropriate. 2. Scattered aortic atherosclerosis. Electronically Signed   By: Roanna RaiderJeffery  Chang M.D.   On: 07/12/2017 22:56    Assessment and Plan:   1. Chest Pain: 51 y.o. female with no previous cardiac history but PMH of GERD, hiatal hernia, PUD with prior h/o bleeding ulcers, lumbar and cervical disk disease and h/o tobacco abuse, who presented  overnight with complaints of nausea and epigastric pain for the last 6 weeks. Pt is being seen today for the evaluation of chest pain,  at the request of Dr. Ronalee BeltsBhandari, Internal Medicine. EKG is nonischemic. POC troponin in the ED was abnormal at 0.10, however f/u  lab troponins were negative x 2. Her recent symptoms may be more GI related and she is scheduled to undergo an EGD tomorrow. She has however had some exertional dyspnea and exertional CP walking up stairs and she is an active, long time, smoker. This is her only cardiac risk factor. Despite GI findings, I think that she would benefit from cardiac screening. Since her EGD is tomorrow, we can plan for stress test on Friday. If EGD reveals GI etiology to explain her recent symptoms, then we can maybe cancel inpatient cardiac testing and consider outpatient stress test, once she recovers from her hospitalization. MD to follow with further recommendations.    Signed, Robbie Lis, PA-C  07/13/2017 10:29 AM  Pager [phone]  Agree with note written by Boyce Medici  PAC  51 year old divorced Caucasian female admitted with atypical chest pain. She has remote history of peptic ulcer disease. Her only risk factors are tobacco abuse.she had cervical fusion surgery approximately 6 weeks ago. She's had upper chest burning, as well as nausea. Her exam is benign. Her EKG shows no acute changes. Enzymes are normal as well. Her symptoms sound more like anxiety. They do not sound like unstable angina. I think it's safe to proceed with upper endoscopy tomorrow. We can pace her decision to proceed with inpatient versus outpatient pharmacologic Myoview stress testing to stratify her for an ischemic etiology after that although I think this is less likely.   Nanetta Batty 07/13/2017 1:45 PM

## 2017-07-13 NOTE — Consult Note (Signed)
[facility] Consult: 11:03 AM 07/13/2017  LOS: 0 days    Referring Provider: Dr Elberta SpanielBandhari  Primary Care Physician:  Dr Radford [facility]ikel at [facility] Primary Gastroenterologist:  unassigned     Reason for Consultation:  Nausea, vomiting, epigastric pain   HPI: Debra [name] is a 51 y.o. female.  PMH Panic/anxiety.  GERD.  Hx bleeding ulcers.  S/p lumbo-sacral spinal fusion and cervical spine fusion.  S/p abd hysterectomy, tonsilectomy.  S/PE up her endoscopy when she was in her twenties. She had "3 bleeding ulcers, hiatal hernia ". No upper endoscopy since then and never has had colonoscopy.     Patient underwent cervical spine surgery, C6/7,  for herniated disc on 05/18/17. A previous neurosurgeon had put her on dexamethasone for a few weeks and had not wanted to pursue surgery. She was weaned off the dexamethasone. For at least a year patient has intermittently been treated for what was presumed to be oral candidiasis with nystatin and Diflucan. 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 It was explained to her that reflux is often the cause of this and the ENT doctor initiated patient on Dexilant.  This was a surprise to the patient because she rarely had any reflux symptoms By the time of the patient's cervical spine surgery, she was having nausea but not emesis, loose, brown, non -bloody diarrhea and epigastric pain. These have persisted since the surgery despite her taking Dexilant.  Zofran was prescribed by her PMD and this helped a little bit. Imodium one or 2 daily as needed does control the diarrhea but it generally resumes the next day. On one occasion after taking 2 Imodium it was a week before she had another stool.  C diff was checked and negative.  Because of her  nausea, the patient has primarily spent her time in bed at home for the last few weeks. She feels quite weak. Some dizziness but no syncope.  In recent days the patient feels like she has swelling in her neck and like her throat is closing up. Making it challenging to swallow her pills.Patient uses occasional ibuprofen when tramadol and lidocaine patches are not helpful. She generally takes 600 mg daily when necessary. She's used this maybe 3 or 4 times within the last several weeks. She smokes a pack of cigarettes daily. She doesn't consume alcoholic beverages.  Patient has several disparate complaints of which she provides extreme detail.  Because of persistent nausea and the epigastric pain as well as swelling in her neck, [facility] Labs reviewed. There is no anemia, no elevated white blood cell count. She does have hypokalemia. LFTs normal.   Contrasted abdominal pelvic CT suggests mild gastric antral thickening and scattered aortic atherosclerosis  Family history notable for peptic ulcer disease in her father.  Past Medical History:  Diagnosis Date  . Anxiety    panic attack  . Dyspnea    with exertion  . GERD (gastroesophageal reflux disease)   . History of bleeding ulcers 1994  .  Pneumonia 2013    Past Surgical History:  Procedure Laterality Date  . ABDOMINAL HYSTERECTOMY    . ANTERIOR CERVICAL DECOMP/DISCECTOMY FUSION N/A 05/18/2017   Procedure: ACDF - C6-C7;  Surgeon: Coletta Memos, MD;  Location: [facility] OR;  Service: Neurosurgery;  Laterality: N/A;  . BACK SURGERY     L 6 Fusion  . TONSILLECTOMY    . UPPER GASTROINTESTINAL ENDOSCOPY  1994    Prior to Admission medications   Medication Sig Start Date End Date Taking? Authorizing Provider  baclofen (LIORESAL) 20 MG tablet Take 20 mg by mouth 3 (three) times daily.   Yes [provider]  citalopram (CELEXA) 40 MG tablet Take 40 mg by mouth every evening.   Yes [provider]    dexlansoprazole (DEXILANT) 60 MG capsule Take 60 mg by mouth daily.   Yes [provider]  lidocaine (LIDODERM) 5 % Place 1 patch onto the skin as needed for pain. 05/26/17  Yes [provider]  pregabalin (LYRICA) 100 MG capsule Take 100 mg by mouth 3 (three) times daily.   Yes [provider]  traMADol (ULTRAM) 50 MG tablet Take 1 tablet (50 mg total) by mouth every 6 (six) hours as needed. Patient taking differently: Take 50 mg by mouth every 6 (six) hours as needed for moderate pain.  05/19/17  Yes Coletta Memos, MD    Scheduled Meds: . baclofen  20 mg Oral TID  . enoxaparin (LOVENOX) injection  40 mg Subcutaneous Q24H  . nicotine  14 mg Transdermal Once  . pregabalin  100 mg Oral TID   Infusions:  PRN Meds: acetaminophen **OR** acetaminophen, diazepam   Allergies as of 07/12/2017 - Review Complete 07/12/2017  Allergen Reaction Noted  . Cefaclor Diarrhea and Rash 10/21/2010  . Sertraline  01/04/2012  . Amoxicillin-pot clavulanate Diarrhea 10/21/2010  . Codeine Nausea And Vomiting 10/21/2010  . Latex Itching and Swelling 05/06/2017    Family History  Problem Relation Age of Onset  . Hypertension Mother   . COPD Mother   . Ulcers Father   . Heart disease Father   . Heart attack Paternal Uncle   . Heart attack Paternal Grandfather     Social History   Social History  . Marital status: Divorced    Spouse name: N/A  . Number of children: N/A  . Years of education: N/A   Occupational History  . Not on file.   Social History Main Topics  . Smoking status: Current Every Day Smoker    Packs/day: 0.50    Years: 31.00  . Smokeless tobacco: Never Used  . Alcohol use No  . Drug use: No  . Sexual activity: Not on file   Other Topics Concern  . Not on file   Social History Narrative  . No narrative on file    REVIEW OF SYSTEMS: Constitutional:  Per HPI ENT:  No nose bleeds Pulm:  No productive cough. Minor dyspnea on exertion along  with minor chest pressure.  CV:  No palpitations, no LE edema. At times the epigastric pain radiates into the lower chest.  GU:  No hematuria, no frequency GI:  Per HPI Heme:  Denies excessive or unusual bleeding/bruising.   Transfusions:  No history of anemia or prior transfusions. Neuro:  Occasional headaches.  No peripheral tingling or numbness Derm:  No itching, no rash or sores.  Endocrine:  No sweats or chills.  No polyuria or dysuria Immunization:  Didn't inquire as to recent immunization. Travel:  None beyond 100 mile radius in recent months   PHYSICAL EXAM: Vital signs in last 24 hours: Vitals:   07/12/17 2304 07/13/17 0423  BP: 127/73 110/66  Pulse: 81 83  Resp: 16 17  Temp: 98.2 F (36.8 C) 98 F (36.7 C)  SpO2: 95% 94%   Wt Readings from Last 3 Encounters:  07/12/17 72.6 kg (160 lb)  05/18/17 79.3 kg (174 lb 14.4 oz)    General: anxious, loquacious, not acutely ill-appearing WF. Head:  No facial asymmetry or swelling. No signs of head trauma.  Eyes:  No scleral icterus or conjunctival pallor. Ears:  Not hard of hearing  Nose:  No congestion or discharge Mouth:  Oropharynx moist, clear. Tongue midline. Good dentition. I don't see any hairy tongue. Neck:  No JVD, thyromegaly, masses. Inch long, well-healed scar notable at the lower left Lungs:  Diminished breath sounds globally. No dyspnea. No cough.No adventitious sounds Heart: RRR. No MRG. S1, S2 present. Abdomen:  Soft. Minor discomfort in the epigastrium. No HSM, no masses. No hernias.  Bowel sounds active.   Rectal: deferred. Patient preferred not to undergo rectal exam.   Musc/Skeltl: no joint erythema, swelling or gross deformity. Extremities:  No CCE.  Neurologic:  Alert. Oriented times 3. No tremor, no limb weakness. Skin:  No rash, no sores. Skin is generally erythematous/reddened in appearance with facial rubor Tattoos:  none Nodes:  No cervical adenopathy.   Psych:  Anxious. Fluid speech but  rambles in her history. Cooperative.  Intake/Output from previous day: 08/14 0701 - 08/15 0700 In: 887.5 [I.V.:887.5] Out: -  Intake/Output this shift: No intake/output data recorded.  LAB RESULTS:  Recent Labs  07/12/17 1210 07/13/17 0608  WBC 10.5 7.3  HGB 14.8 12.0  HCT 44.8 36.8  PLT 398 305   BMET Lab Results  Component Value Date   NA 142 07/13/2017   NA 140 07/12/2017   K 3.2 (L) 07/13/2017   K 2.9 (L) 07/12/2017   CL 109 07/13/2017   CL 102 07/12/2017   CO2 24 07/13/2017   CO2 26 07/12/2017   GLUCOSE 91 07/13/2017   GLUCOSE 109 (H) 07/12/2017   BUN <5 (L) 07/13/2017   BUN <5 (L) 07/12/2017   CREATININE 0.57 07/13/2017   CREATININE 0.65 07/12/2017   CALCIUM 8.2 (L) 07/13/2017   CALCIUM 9.1 07/12/2017   LFT  Recent Labs  07/12/17 1210  PROT 7.0  ALBUMIN 3.8  AST 20  ALT 13*  ALKPHOS 107  BILITOT 0.7   PT/INR No results found for: INR, PROTIME Hepatitis Panel No results for input(s): HEPBSAG, HCVAB, HEPAIGM, HEPBIGM in the last 72 hours. C-Diff No components found for: CDIFF Lipase     Component Value Date/Time   LIPASE 23 07/12/2017 1210    Drugs of Abuse  No results found for: LABOPIA, COCAINSCRNUR, LABBENZ, AMPHETMU, THCU, LABBARB   RADIOLOGY STUDIES: Dg Chest 2 View  Result Date: 07/12/2017 CLINICAL DATA:  Nausea for 6 weeks EXAM: CHEST  2 VIEW COMPARISON:  None. FINDINGS: Cervical spine hardware. No acute consolidation or pleural effusion. Normal cardiomediastinal silhouette. No pneumothorax. IMPRESSION: No active cardiopulmonary disease. Electronically Signed   By: Jasmine Pang M.D.   On: 07/12/2017 18:06   Ct Abdomen Pelvis W Contrast  Result Date: 07/12/2017 CLINICAL DATA:  Chronic nausea and vomiting.  Initial encounter. EXAM: CT ABDOMEN AND PELVIS WITH CONTRAST TECHNIQUE: Multidetector CT imaging of the abdomen and pelvis was performed using the standard protocol following bolus administration of intravenous  contrast. CONTRAST:   ISOVUE-300 IOPAMIDOL (ISOVUE-300) INJECTION 61% COMPARISON:  None. FINDINGS: Lower chest: Minimal bibasilar atelectasis or scarring is noted. The visualized portions of the mediastinum are unremarkable. Hepatobiliary: The liver is unremarkable in appearance. The gallbladder is unremarkable in appearance. The common bile duct remains normal in caliber. Pancreas: The pancreas is within normal limits. Spleen: The spleen is unremarkable in appearance. Adrenals/Urinary Tract: The adrenal glands are unremarkable in appearance. The kidneys are within normal limits. There is no evidence of hydronephrosis. No renal or ureteral stones are identified. No perinephric stranding is seen. Stomach/Bowel: There is question of mild wall thickening at the antrum of the stomach. Would correlate with the patient's symptoms, and consider endoscopy as deemed clinically appropriate. The small bowel is within normal limits. The appendix is not visualized; there is no evidence for appendicitis. Mild diverticulosis is noted at the sigmoid colon, without evidence of diverticulitis. Vascular/Lymphatic: Scattered calcification is seen along the abdominal aorta and its branches. The abdominal aorta is otherwise grossly unremarkable. The inferior vena cava is grossly unremarkable. No retroperitoneal lymphadenopathy is seen. No pelvic sidewall lymphadenopathy is identified. Reproductive: The bladder is mildly distended and grossly unremarkable. The patient is status post hysterectomy. No suspicious adnexal masses are seen. The ovaries are relatively symmetric. Other: No additional soft tissue abnormalities are seen. Musculoskeletal: No acute osseous abnormalities are identified. The patient is status post anterior lumbosacral spinal fusion at L5-S1. The visualized musculature is unremarkable in appearance. IMPRESSION: 1. Question of mild wall thickening at the antrum of the stomach. Would correlate with the patient's symptoms, and consider  endoscopy as deemed clinically appropriate. 2. Scattered aortic atherosclerosis. Electronically Signed   By: Roanna Raider M.D.   On: 07/12/2017 22:56      IMPRESSION:   *  Nausea, epigastric pain, diarrhea Hx "bleeding ulcers" in her 20s.  On Dexialant since mid 04/2017 for hairy tongue.  It was around this time or shortly thereafter that her GI complaints started.  ? is this med s/e.  R/O GERD, esophagitis, gastritis, ulcer.    *  Hypokalemia  *  Anxiety.    *  HNP.  S/p C spine surgery 05/18/17.    *  Exertional dyspnea and chest tightness.  Cardiac enzymes negative, EKG unremarkable.  Per Cardiology PA, they are planning stress test.      PLAN:     *  Since the patient consumed some crackers as well as ginger ale around 10:30 this morning, anesthesiology cannot sedate the patient until late this afternoon.  Therefore upper endoscopy with possible esophageal dilatation was set up for tomorrow  *  Will stop the Lovenox and resume this on 8/17. This will allow for safe for endoscopy with possible dilatation.  Placed orders for PAS hose.   Added Protonix 1 x daily.    Jennye Moccasin  07/13/2017, 11:03 AM Pager: [phone]     Attending physician's note   I have taken a history, examined the patient and reviewed the chart. I agree with the Advanced Practitioner's note, impression and recommendations. R/O GERD, esophagitis, gastritis, ulcer, anxiety, functional symptoms, Dexilant side effects. Mildly thickened gastric antrum on CT. Discontinue Dexilant. Start Protonix daily. EGD scheduled for 1045 tomorrow.   Claudette Head, MD Clementeen Graham [phone] Mon-Fri 8a-5p [phone] after 5p, weekends, holidays

## 2017-07-13 NOTE — Progress Notes (Signed)
  Echocardiogram 2D Echocardiogram has been performed.  Nolon RodBrown, Tony 07/13/2017, 2:56 PM

## 2017-07-14 ENCOUNTER — Observation Stay (HOSPITAL_COMMUNITY): Payer: Medicaid Other | Admitting: Anesthesiology

## 2017-07-14 ENCOUNTER — Other Ambulatory Visit: Payer: Self-pay | Admitting: Cardiology

## 2017-07-14 ENCOUNTER — Encounter (HOSPITAL_COMMUNITY): Payer: Self-pay | Admitting: *Deleted

## 2017-07-14 ENCOUNTER — Encounter (HOSPITAL_COMMUNITY)
Admission: EM | Disposition: A | Discharge status: Home / Self Care | Payer: Self-pay | Source: Home / Self Care | Attending: Nephrology

## 2017-07-14 DIAGNOSIS — Z91040 Latex allergy status: Secondary | ICD-10-CM | POA: Diagnosis not present

## 2017-07-14 DIAGNOSIS — K3189 Other diseases of stomach and duodenum: Secondary | ICD-10-CM | POA: Diagnosis present

## 2017-07-14 DIAGNOSIS — M50223 Other cervical disc displacement at C6-C7 level: Secondary | ICD-10-CM | POA: Diagnosis present

## 2017-07-14 DIAGNOSIS — R1013 Epigastric pain: Secondary | ICD-10-CM | POA: Diagnosis not present

## 2017-07-14 DIAGNOSIS — K219 Gastro-esophageal reflux disease without esophagitis: Secondary | ICD-10-CM | POA: Diagnosis present

## 2017-07-14 DIAGNOSIS — K2960 Other gastritis without bleeding: Secondary | ICD-10-CM | POA: Diagnosis present

## 2017-07-14 DIAGNOSIS — R933 Abnormal findings on diagnostic imaging of other parts of digestive tract: Secondary | ICD-10-CM | POA: Diagnosis not present

## 2017-07-14 DIAGNOSIS — E860 Dehydration: Secondary | ICD-10-CM | POA: Diagnosis present

## 2017-07-14 DIAGNOSIS — E876 Hypokalemia: Secondary | ICD-10-CM | POA: Diagnosis present

## 2017-07-14 DIAGNOSIS — R748 Abnormal levels of other serum enzymes: Secondary | ICD-10-CM | POA: Diagnosis present

## 2017-07-14 DIAGNOSIS — F418 Other specified anxiety disorders: Secondary | ICD-10-CM | POA: Diagnosis present

## 2017-07-14 DIAGNOSIS — F410 Panic disorder [episodic paroxysmal anxiety] without agoraphobia: Secondary | ICD-10-CM | POA: Diagnosis present

## 2017-07-14 DIAGNOSIS — Z888 Allergy status to other drugs, medicaments and biological substances status: Secondary | ICD-10-CM | POA: Diagnosis not present

## 2017-07-14 DIAGNOSIS — Z79899 Other long term (current) drug therapy: Secondary | ICD-10-CM | POA: Diagnosis not present

## 2017-07-14 DIAGNOSIS — Z8711 Personal history of peptic ulcer disease: Secondary | ICD-10-CM | POA: Diagnosis not present

## 2017-07-14 DIAGNOSIS — R079 Chest pain, unspecified: Secondary | ICD-10-CM

## 2017-07-14 DIAGNOSIS — Z981 Arthrodesis status: Secondary | ICD-10-CM | POA: Diagnosis not present

## 2017-07-14 DIAGNOSIS — R110 Nausea: Secondary | ICD-10-CM | POA: Diagnosis not present

## 2017-07-14 DIAGNOSIS — F419 Anxiety disorder, unspecified: Secondary | ICD-10-CM | POA: Diagnosis not present

## 2017-07-14 DIAGNOSIS — G8929 Other chronic pain: Secondary | ICD-10-CM | POA: Diagnosis present

## 2017-07-14 DIAGNOSIS — Z885 Allergy status to narcotic agent status: Secondary | ICD-10-CM | POA: Diagnosis not present

## 2017-07-14 DIAGNOSIS — M5080 Other cervical disc disorders, unspecified cervical region: Secondary | ICD-10-CM | POA: Diagnosis present

## 2017-07-14 DIAGNOSIS — M549 Dorsalgia, unspecified: Secondary | ICD-10-CM | POA: Diagnosis present

## 2017-07-14 DIAGNOSIS — R072 Precordial pain: Secondary | ICD-10-CM | POA: Diagnosis present

## 2017-07-14 HISTORY — PX: ESOPHAGOGASTRODUODENOSCOPY: SHX5428

## 2017-07-14 LAB — BASIC METABOLIC PANEL
ANION GAP: 9 (ref 5–15)
BUN: <5 mg/dL — ABNORMAL LOW (ref 6–20)
CALCIUM: 8.9 mg/dL (ref 8.9–10.3)
CHLORIDE: 108 mmol/L (ref 101–111)
CO2: 26 mmol/L (ref 22–32)
Creatinine, Ser: 0.51 mg/dL (ref 0.44–1.00)
GFR calc non Af Amer: >60 mL/min (ref >60)
Glucose, Bld: 107 mg/dL — ABNORMAL HIGH (ref 65–99)
POTASSIUM: 4 mmol/L (ref 3.5–5.1)
Sodium: 143 mmol/L (ref 135–145)

## 2017-07-14 SURGERY — EGD (ESOPHAGOGASTRODUODENOSCOPY)
Anesthesia: Monitor Anesthesia Care

## 2017-07-14 MED ORDER — DIAZEPAM 5 MG PO TABS: 5.0000 mg | ORAL_TABLET | Freq: Every evening | ORAL | 0 refills | Status: DC | PRN

## 2017-07-14 MED ORDER — ONDANSETRON HCL 4 MG PO TABS: 4.0000 mg | ORAL_TABLET | Freq: Three times a day (TID) | ORAL | 0 refills | Status: DC | PRN

## 2017-07-14 MED ORDER — NICOTINE 14 MG/24HR TD PT24
14.0000 mg | MEDICATED_PATCH | Freq: Every day | TRANSDERMAL | Status: DC
  Administered 2017-07-14: 14 mg via TRANSDERMAL
  Filled 2017-07-14: qty 1

## 2017-07-14 MED ORDER — FENTANYL CITRATE (PF) 100 MCG/2ML IJ SOLN
INTRAMUSCULAR | Status: DC | PRN
  Administered 2017-07-14: 100 ug via INTRAVENOUS

## 2017-07-14 MED ORDER — PROPOFOL 500 MG/50ML IV EMUL
INTRAVENOUS | Status: DC | PRN
  Administered 2017-07-14: 65 ug/kg/min via INTRAVENOUS

## 2017-07-14 MED ORDER — POTASSIUM CHLORIDE CRYS ER 20 MEQ PO TBCR: 20.0000 meq | EXTENDED_RELEASE_TABLET | Freq: Every day | ORAL | 0 refills | Status: DC

## 2017-07-14 MED ORDER — MIDAZOLAM HCL 5 MG/5ML IJ SOLN
INTRAMUSCULAR | Status: DC | PRN
  Administered 2017-07-14: 2 mg via INTRAVENOUS

## 2017-07-14 MED ORDER — LACTATED RINGERS IV SOLN
INTRAVENOUS | Status: AC | PRN
  Administered 2017-07-14: 1000 mL via INTRAVENOUS

## 2017-07-14 MED ORDER — LIDOCAINE HCL (CARDIAC) 20 MG/ML IV SOLN
INTRAVENOUS | Status: DC | PRN
  Administered 2017-07-14: 75 mg via INTRATRACHEAL

## 2017-07-14 MED ORDER — PANTOPRAZOLE SODIUM 40 MG PO TBEC: 40.0000 mg | DELAYED_RELEASE_TABLET | Freq: Every day | ORAL | 0 refills | Status: DC

## 2017-07-14 NOTE — Anesthesia Postprocedure Evaluation (Signed)
Anesthesia Post Note  Patient: Debra [name]  Procedure(s) Performed: Procedure(s) (LRB): ESOPHAGOGASTRODUODENOSCOPY (EGD) (N/A)     Patient location during evaluation: PACU Anesthesia Type: MAC Level of consciousness: awake and alert Pain management: pain level controlled Vital Signs Assessment: post-procedure vital signs reviewed and stable Respiratory status: spontaneous breathing, nonlabored ventilation, respiratory function stable and patient connected to nasal cannula oxygen Cardiovascular status: stable and blood pressure returned to baseline Anesthetic complications: no    Last Vitals:  Vitals:   07/14/17 1100 07/14/17 1110  BP: 118/71 124/65  Pulse: 78 76  Resp: 16 17  Temp:    SpO2: 93% 94%    Last Pain:  Vitals:   07/14/17 1053  TempSrc: Oral  PainSc:                  Ryan P Ellender

## 2017-07-14 NOTE — Anesthesia Preprocedure Evaluation (Addendum)
Anesthesia Evaluation  Patient identified by MRN, date of birth, ID band Patient awake    Reviewed: Allergy & Precautions, H&P , NPO status , Patient's Chart, lab work & pertinent test results  Airway Mallampati: III  TM Distance: >3 FB Neck ROM: Full    Dental  (+) Teeth Intact, Dental Advisory Given, Chipped,    Pulmonary Current Smoker,    Pulmonary exam normal breath sounds clear to auscultation       Cardiovascular negative cardio ROS   Rhythm:Regular Rate:Normal  ECG: NSR, rate 81  ECHO: LV EF: 60% -   65%   Neuro/Psych Anxiety    GI/Hepatic Neg liver ROS, GERD  Medicated,  Endo/Other  negative endocrine ROS  Renal/GU negative Renal ROS     Musculoskeletal   Abdominal   Peds  Hematology negative hematology ROS (+)   Anesthesia Other Findings   Reproductive/Obstetrics                            Anesthesia Physical  Anesthesia Plan  ASA: II  Anesthesia Plan: MAC   Post-op Pain Management:    Induction: Intravenous  PONV Risk Score and Plan: 1 and Propofol and Treatment may vary due to age or medical condition  Airway Management Planned: Nasal Cannula  Additional Equipment:   Intra-op Plan:   Post-operative Plan:   Informed Consent: I have reviewed the patients History and Physical, chart, labs and discussed the procedure including the risks, benefits and alternatives for the proposed anesthesia with the patient or authorized representative who has indicated his/her understanding and acceptance.   Dental advisory given  Plan Discussed with: CRNA  Anesthesia Plan Comments:         Anesthesia Quick Evaluation

## 2017-07-14 NOTE — Interval H&P Note (Signed)
History and Physical Interval Note:  07/14/2017 10:28 AM  Debra FallenMichelle [name]  has presented today for surgery, with the diagnosis of nausea, epigastric pain, diarrhea, dysphagia  The various methods of treatment have been discussed with the patient and family. After consideration of risks, benefits and other options for treatment, the patient has consented to  Procedure(s): ESOPHAGOGASTRODUODENOSCOPY (EGD) (N/A) SAVORY DILATION (N/A) as a surgical intervention .  The patient's history has been reviewed, patient examined, no change in status, stable for surgery.  I have reviewed the patient's chart and labs.  Questions were answered to the patient's satisfaction.     Venita LickMalcolm T. Russella DarStark

## 2017-07-14 NOTE — Transfer of Care (Signed)
Immediate Anesthesia Transfer of Care Note  Patient: Debra [name]  Procedure(s) Performed: Procedure(s): ESOPHAGOGASTRODUODENOSCOPY (EGD) (N/A) SAVORY DILATION (N/A)  Patient Location: Endoscopy Unit  Anesthesia Type:MAC  Level of Consciousness: awake, alert  and oriented  Airway & Oxygen Therapy: Patient Spontanous Breathing and Patient connected to nasal cannula oxygen  Post-op Assessment: Report given to RN, Post -op Vital signs reviewed and stable and Patient moving all extremities X 4  Post vital signs: Reviewed and stable  Last Vitals:  Vitals:   07/14/17 1006 07/14/17 1053  BP: 131/85 (!) 88/55  Pulse:  79  Resp: 16 15  Temp: 36.7 C 36.4 C  SpO2: 95% 97%    Last Pain:  Vitals:   07/14/17 1053  TempSrc: Oral  PainSc:          Complications: No apparent anesthesia complications

## 2017-07-14 NOTE — H&P (View-Only) (Signed)
[facility] Consult: 11:03 AM 07/13/2017  LOS: 0 days    Referring Provider: Dr Elberta SpanielBandhari  Primary Care Physician:  Dr Radford [facility]ikel at [facility] Primary Gastroenterologist:  unassigned     Reason for Consultation:  Nausea, vomiting, epigastric pain   HPI: Debra [name] is a 51 y.o. female.  PMH Panic/anxiety.  GERD.  Hx bleeding ulcers.  S/p lumbo-sacral spinal fusion and cervical spine fusion.  S/p abd hysterectomy, tonsilectomy.  S/PE up her endoscopy when she was in her twenties. She had "3 bleeding ulcers, hiatal hernia ". No upper endoscopy since then and never has had colonoscopy.     Patient underwent cervical spine surgery, C6/7,  for herniated disc on 05/18/17. A previous neurosurgeon had put her on dexamethasone for a few weeks and had not wanted to pursue surgery. She was weaned off the dexamethasone. For at least a year patient has intermittently been treated for what was presumed to be oral candidiasis with nystatin and Diflucan. 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 It was explained to her that reflux is often the cause of this and the ENT doctor initiated patient on Dexilant.  This was a surprise to the patient because she rarely had any reflux symptoms By the time of the patient's cervical spine surgery, she was having nausea but not emesis, loose, brown, non -bloody diarrhea and epigastric pain. These have persisted since the surgery despite her taking Dexilant.  Zofran was prescribed by her PMD and this helped a little bit. Imodium one or 2 daily as needed does control the diarrhea but it generally resumes the next day. On one occasion after taking 2 Imodium it was a week before she had another stool.  C diff was checked and negative.  Because of her  nausea, the patient has primarily spent her time in bed at home for the last few weeks. She feels quite weak. Some dizziness but no syncope.  In recent days the patient feels like she has swelling in her neck and like her throat is closing up. Making it challenging to swallow her pills.Patient uses occasional ibuprofen when tramadol and lidocaine patches are not helpful. She generally takes 600 mg daily when necessary. She's used this maybe 3 or 4 times within the last several weeks. She smokes a pack of cigarettes daily. She doesn't consume alcoholic beverages.  Patient has several disparate complaints of which she provides extreme detail.  Because of persistent nausea and the epigastric pain as well as swelling in her neck, [facility] Labs reviewed. There is no anemia, no elevated white blood cell count. She does have hypokalemia. LFTs normal.   Contrasted abdominal pelvic CT suggests mild gastric antral thickening and scattered aortic atherosclerosis  Family history notable for peptic ulcer disease in her father.  Past Medical History:  Diagnosis Date  . Anxiety    panic attack  . Dyspnea    with exertion  . GERD (gastroesophageal reflux disease)   . History of bleeding ulcers 1994  .  Pneumonia 2013    Past Surgical History:  Procedure Laterality Date  . ABDOMINAL HYSTERECTOMY    . ANTERIOR CERVICAL DECOMP/DISCECTOMY FUSION N/A 05/18/2017   Procedure: ACDF - C6-C7;  Surgeon: Coletta Memos, MD;  Location: [facility] OR;  Service: Neurosurgery;  Laterality: N/A;  . BACK SURGERY     L 6 Fusion  . TONSILLECTOMY    . UPPER GASTROINTESTINAL ENDOSCOPY  1994    Prior to Admission medications   Medication Sig Start Date End Date Taking? Authorizing Provider  baclofen (LIORESAL) 20 MG tablet Take 20 mg by mouth 3 (three) times daily.   Yes [provider]  citalopram (CELEXA) 40 MG tablet Take 40 mg by mouth every evening.   Yes [provider]    dexlansoprazole (DEXILANT) 60 MG capsule Take 60 mg by mouth daily.   Yes [provider]  lidocaine (LIDODERM) 5 % Place 1 patch onto the skin as needed for pain. 05/26/17  Yes [provider]  pregabalin (LYRICA) 100 MG capsule Take 100 mg by mouth 3 (three) times daily.   Yes [provider]  traMADol (ULTRAM) 50 MG tablet Take 1 tablet (50 mg total) by mouth every 6 (six) hours as needed. Patient taking differently: Take 50 mg by mouth every 6 (six) hours as needed for moderate pain.  05/19/17  Yes Coletta Memos, MD    Scheduled Meds: . baclofen  20 mg Oral TID  . enoxaparin (LOVENOX) injection  40 mg Subcutaneous Q24H  . nicotine  14 mg Transdermal Once  . pregabalin  100 mg Oral TID   Infusions:  PRN Meds: acetaminophen **OR** acetaminophen, diazepam   Allergies as of 07/12/2017 - Review Complete 07/12/2017  Allergen Reaction Noted  . Cefaclor Diarrhea and Rash 10/21/2010  . Sertraline  01/04/2012  . Amoxicillin-pot clavulanate Diarrhea 10/21/2010  . Codeine Nausea And Vomiting 10/21/2010  . Latex Itching and Swelling 05/06/2017    Family History  Problem Relation Age of Onset  . Hypertension Mother   . COPD Mother   . Ulcers Father   . Heart disease Father   . Heart attack Paternal Uncle   . Heart attack Paternal Grandfather     Social History   Social History  . Marital status: Divorced    Spouse name: N/A  . Number of children: N/A  . Years of education: N/A   Occupational History  . Not on file.   Social History Main Topics  . Smoking status: Current Every Day Smoker    Packs/day: 0.50    Years: 31.00  . Smokeless tobacco: Never Used  . Alcohol use No  . Drug use: No  . Sexual activity: Not on file   Other Topics Concern  . Not on file   Social History Narrative  . No narrative on file    REVIEW OF SYSTEMS: Constitutional:  Per HPI ENT:  No nose bleeds Pulm:  No productive cough. Minor dyspnea on exertion along  with minor chest pressure.  CV:  No palpitations, no LE edema. At times the epigastric pain radiates into the lower chest.  GU:  No hematuria, no frequency GI:  Per HPI Heme:  Denies excessive or unusual bleeding/bruising.   Transfusions:  No history of anemia or prior transfusions. Neuro:  Occasional headaches.  No peripheral tingling or numbness Derm:  No itching, no rash or sores.  Endocrine:  No sweats or chills.  No polyuria or dysuria Immunization:  Didn't inquire as to recent immunization. Travel:  None beyond 100 mile radius in recent months   PHYSICAL EXAM: Vital signs in last 24 hours: Vitals:   07/12/17 2304 07/13/17 0423  BP: 127/73 110/66  Pulse: 81 83  Resp: 16 17  Temp: 98.2 F (36.8 C) 98 F (36.7 C)  SpO2: 95% 94%   Wt Readings from Last 3 Encounters:  07/12/17 72.6 kg (160 lb)  05/18/17 79.3 kg (174 lb 14.4 oz)    General: anxious, loquacious, not acutely ill-appearing WF. Head:  No facial asymmetry or swelling. No signs of head trauma.  Eyes:  No scleral icterus or conjunctival pallor. Ears:  Not hard of hearing  Nose:  No congestion or discharge Mouth:  Oropharynx moist, clear. Tongue midline. Good dentition. I don't see any hairy tongue. Neck:  No JVD, thyromegaly, masses. Inch long, well-healed scar notable at the lower left Lungs:  Diminished breath sounds globally. No dyspnea. No cough.No adventitious sounds Heart: RRR. No MRG. S1, S2 present. Abdomen:  Soft. Minor discomfort in the epigastrium. No HSM, no masses. No hernias.  Bowel sounds active.   Rectal: deferred. Patient preferred not to undergo rectal exam.   Musc/Skeltl: no joint erythema, swelling or gross deformity. Extremities:  No CCE.  Neurologic:  Alert. Oriented times 3. No tremor, no limb weakness. Skin:  No rash, no sores. Skin is generally erythematous/reddened in appearance with facial rubor Tattoos:  none Nodes:  No cervical adenopathy.   Psych:  Anxious. Fluid speech but  rambles in her history. Cooperative.  Intake/Output from previous day: 08/14 0701 - 08/15 0700 In: 887.5 [I.V.:887.5] Out: -  Intake/Output this shift: No intake/output data recorded.  LAB RESULTS:  Recent Labs  07/12/17 1210 07/13/17 0608  WBC 10.5 7.3  HGB 14.8 12.0  HCT 44.8 36.8  PLT 398 305   BMET Lab Results  Component Value Date   NA 142 07/13/2017   NA 140 07/12/2017   K 3.2 (L) 07/13/2017   K 2.9 (L) 07/12/2017   CL 109 07/13/2017   CL 102 07/12/2017   CO2 24 07/13/2017   CO2 26 07/12/2017   GLUCOSE 91 07/13/2017   GLUCOSE 109 (H) 07/12/2017   BUN <5 (L) 07/13/2017   BUN <5 (L) 07/12/2017   CREATININE 0.57 07/13/2017   CREATININE 0.65 07/12/2017   CALCIUM 8.2 (L) 07/13/2017   CALCIUM 9.1 07/12/2017   LFT  Recent Labs  07/12/17 1210  PROT 7.0  ALBUMIN 3.8  AST 20  ALT 13*  ALKPHOS 107  BILITOT 0.7   PT/INR No results found for: INR, PROTIME Hepatitis Panel No results for input(s): HEPBSAG, HCVAB, HEPAIGM, HEPBIGM in the last 72 hours. C-Diff No components found for: CDIFF Lipase     Component Value Date/Time   LIPASE 23 07/12/2017 1210    Drugs of Abuse  No results found for: LABOPIA, COCAINSCRNUR, LABBENZ, AMPHETMU, THCU, LABBARB   RADIOLOGY STUDIES: Dg Chest 2 View  Result Date: 07/12/2017 CLINICAL DATA:  Nausea for 6 weeks EXAM: CHEST  2 VIEW COMPARISON:  None. FINDINGS: Cervical spine hardware. No acute consolidation or pleural effusion. Normal cardiomediastinal silhouette. No pneumothorax. IMPRESSION: No active cardiopulmonary disease. Electronically Signed   By: Jasmine Pang M.D.   On: 07/12/2017 18:06   Ct Abdomen Pelvis W Contrast  Result Date: 07/12/2017 CLINICAL DATA:  Chronic nausea and vomiting.  Initial encounter. EXAM: CT ABDOMEN AND PELVIS WITH CONTRAST TECHNIQUE: Multidetector CT imaging of the abdomen and pelvis was performed using the standard protocol following bolus administration of intravenous  contrast. CONTRAST:   ISOVUE-300 IOPAMIDOL (ISOVUE-300) INJECTION 61% COMPARISON:  None. FINDINGS: Lower chest: Minimal bibasilar atelectasis or scarring is noted. The visualized portions of the mediastinum are unremarkable. Hepatobiliary: The liver is unremarkable in appearance. The gallbladder is unremarkable in appearance. The common bile duct remains normal in caliber. Pancreas: The pancreas is within normal limits. Spleen: The spleen is unremarkable in appearance. Adrenals/Urinary Tract: The adrenal glands are unremarkable in appearance. The kidneys are within normal limits. There is no evidence of hydronephrosis. No renal or ureteral stones are identified. No perinephric stranding is seen. Stomach/Bowel: There is question of mild wall thickening at the antrum of the stomach. Would correlate with the patient's symptoms, and consider endoscopy as deemed clinically appropriate. The small bowel is within normal limits. The appendix is not visualized; there is no evidence for appendicitis. Mild diverticulosis is noted at the sigmoid colon, without evidence of diverticulitis. Vascular/Lymphatic: Scattered calcification is seen along the abdominal aorta and its branches. The abdominal aorta is otherwise grossly unremarkable. The inferior vena cava is grossly unremarkable. No retroperitoneal lymphadenopathy is seen. No pelvic sidewall lymphadenopathy is identified. Reproductive: The bladder is mildly distended and grossly unremarkable. The patient is status post hysterectomy. No suspicious adnexal masses are seen. The ovaries are relatively symmetric. Other: No additional soft tissue abnormalities are seen. Musculoskeletal: No acute osseous abnormalities are identified. The patient is status post anterior lumbosacral spinal fusion at L5-S1. The visualized musculature is unremarkable in appearance. IMPRESSION: 1. Question of mild wall thickening at the antrum of the stomach. Would correlate with the patient's symptoms, and consider  endoscopy as deemed clinically appropriate. 2. Scattered aortic atherosclerosis. Electronically Signed   By: Roanna Raider M.D.   On: 07/12/2017 22:56      IMPRESSION:   *  Nausea, epigastric pain, diarrhea Hx "bleeding ulcers" in her 20s.  On Dexialant since mid 04/2017 for hairy tongue.  It was around this time or shortly thereafter that her GI complaints started.  ? is this med s/e.  R/O GERD, esophagitis, gastritis, ulcer.    *  Hypokalemia  *  Anxiety.    *  HNP.  S/p C spine surgery 05/18/17.    *  Exertional dyspnea and chest tightness.  Cardiac enzymes negative, EKG unremarkable.  Per Cardiology PA, they are planning stress test.      PLAN:     *  Since the patient consumed some crackers as well as ginger ale around 10:30 this morning, anesthesiology cannot sedate the patient until late this afternoon.  Therefore upper endoscopy with possible esophageal dilatation was set up for tomorrow  *  Will stop the Lovenox and resume this on 8/17. This will allow for safe for endoscopy with possible dilatation.  Placed orders for PAS hose.   Added Protonix 1 x daily.    Jennye Moccasin  07/13/2017, 11:03 AM Pager: [phone]     Attending physician's note   I have taken a history, examined the patient and reviewed the chart. I agree with the Advanced Practitioner's note, impression and recommendations. R/O GERD, esophagitis, gastritis, ulcer, anxiety, functional symptoms, Dexilant side effects. Mildly thickened gastric antrum on CT. Discontinue Dexilant. Start Protonix daily. EGD scheduled for 1045 tomorrow.   Claudette Head, MD Clementeen Graham [phone] Mon-Fri 8a-5p [phone] after 5p, weekends, holidays

## 2017-07-14 NOTE — Op Note (Addendum)
[facility] Patient Name: Debra [name] Procedure Date : 07/14/2017 MRN: [id number] Attending MD: Ladene Artist , MD Date of Birth: [birth year] CSN: [id number] Age: 51 Admit Type: Inpatient Procedure:                Upper GI endoscopy Indications:              Epigastric abdominal pain, Abnormal CT of the GI                            tract, Nausea Providers:                Pricilla Riffle. Fuller Plan, MD, Burtis Junes, RN, Corliss Parish, Technician Referring MD:             [facility] Medicines:                Monitored Anesthesia Care Complications:            No immediate complications. Estimated Blood Loss:     Estimated blood loss was minimal. Procedure:                Pre-Anesthesia Assessment:                           - Prior to the procedure, a History and Physical                            was performed, and patient medications and                            allergies were reviewed. The patient's tolerance of                            previous anesthesia was also reviewed. The risks                            and benefits of the procedure and the sedation                            options and risks were discussed with the patient.                            All questions were answered, and informed consent                            was obtained. Prior Anticoagulants: The patient has                            taken no previous anticoagulant or antiplatelet                            agents. ASA Grade Assessment: II - A patient with  mild systemic disease. After reviewing the risks                            and benefits, the patient was deemed in                            satisfactory condition to undergo the procedure.                           After obtaining informed consent, the endoscope was                            passed under direct vision. Throughout the                            procedure, the  patient's blood pressure, pulse, and                            oxygen saturations were monitored continuously. The                            [id number] ([id number]) scope was introduced through the                            mouth, and advanced to the second part of duodenum.                            The upper GI endoscopy was accomplished without                            difficulty. The patient tolerated the procedure                            well. Scope In: Scope Out: Findings:      The examined esophagus was normal.      Patchy mildly erythematous mucosa without bleeding was found in the       gastric antrum. Biopsies were taken with a cold forceps for histology.      Localized mildly thicken gastric fold with a small depressed deformity       was found in the gastric antrum. Possible site of a healed ulcer.       Biopsies were taken with a cold forceps for histology.      The exam of the stomach was otherwise normal.      The duodenal bulb and second portion of the duodenum were normal. Impression:               - Normal esophagus.                           - Erythematous mucosa in the antrum. Biopsied.                           - Mildly thickened gastric anturm fold with a small  depressed deformity. Biopsied.                           - Normal duodenal bulb and second portion of the                            duodenum. Moderate Sedation:      none/MAC Recommendation:           - Return patient to hospital ward for ongoing care.                           - Resume previous diet.                           - Continue present medications.                           - Pantoprazole 40 mg daily long term.                           - Zofran 4 mg po tid prn nausea                           - No aspirin, ibuprofen, naproxen, or other                            non-steroidal anti-inflammatory drugs.                           - Await pathology results.                            - No additional GI evaluation is planned. OK for                            discharge from GI standpoint. Follow up with PCP. Procedure Code(s):        --- Professional ---                           [phone], Esophagogastroduodenoscopy, flexible,                            transoral; with biopsy, single or multiple Diagnosis Code(s):        --- Professional ---                           K31.89, Other diseases of stomach and duodenum                           K29.60, Other gastritis without bleeding                           R10.13, Epigastric pain                           R11.0, Nausea  R93.3, Abnormal findings on diagnostic imaging of                            other parts of digestive tract CPT copyright 2016 [employer]. All rights reserved. The codes documented in this report are preliminary and upon coder review may  be revised to meet current compliance requirements. Ladene Artist, MD 07/14/2017 11:01:04 AM This report has been signed electronically. Number of Addenda: 0

## 2017-07-14 NOTE — Progress Notes (Signed)
Patient anxious this am, symptoms: sweaty, rambling with words, wired, and patient states she hasn't slept overnight. Patient states she has been deep breathing and coaching herself through the anxious feeling. Valium PRN given will continue to monitor.

## 2017-07-14 NOTE — Discharge Summary (Signed)
Physician Discharge Summary  Debra [name] WUJ:[id number] DOB: 09-27-1966 DOA: 07/12/2017  PCP: System, Pcp Not In  Admit date: 07/12/2017 Discharge date: 07/14/2017  Admitted From:Home Disposition:Home   Recommendations for Outpatient Follow-up:  1. Follow up with PCP in 1-2 weeks 2. Please obtain BMP/CBC in one week  Home Health:no Equipment/Devices:no Discharge Condition:stable CODE STATUS:full code Diet recommendation:heart healthy  Brief/Interim Summary: 51 year old female with history of anxiety depression, prolonged QT interval, GERD, lumbar and cervical disc disease presented with nausea vomiting and epigastric pain.  #Nausea, epigastric pain:  CT scan of abdomenconsistent with thickened antrum. Patient has history of GERD.Evaluated by GI and underwent upper endoscopy today. The endoscopy showed erythematous mucosa in the antrum was biopsied and mildly thickened gastric antrum fold. GI recommended to resume diet and change to Protonix. Zofran as needed for the management of nausea. Recommended to avoid NSAIDs. Patient's symptoms improved. Denied nausea vomiting or abdominal pain. Tolerating diet well.  #Dehydration: Treated with IV fluid. Improved  #Hypokalemia: Serum potassium level improved. Discharged with oral potassium. Recommended to monitor labs with PCP.  #Mild elevation in troponin: Repeat troponin negative. Patient has no chest pain. Echocardiogram with EF of 60-65% with normal systolic function. Patient declined cardiac stress test in the hospital rather wanted a follow-up outpatient. I called cardiology scheduling for arranging outpatient cardiac stress test. Patient has no chest pain, shortness of breath, nausea or vomiting. She understand outpatient patient follow-up instructions.  #Chronic back pain, anxiety/depression: Patient with severe anxiety on admission. Her symptoms are associated with nausea. She feels better with Valium. Patient reported that her family  doctor prescribed Ativan which did not help her. She was requesting. Tablets of Valium prescription until she will be evaluated by her primary care doctor next week. I prescribed 5 tablets of Valium as needed at bedtime. Patient was educated not to drive or involve in any machinery while on pain medication or sedatives. I strongly advised her to follow-up with her PCP for continuation of care. Her mother and son at bedside during discharge.  #Prolonged Qt interval: Continued Celexa after discussion with cardiologist Dr. Allyson Sabal. Patient has been on Celexa for a long time.   Discharge Diagnoses:  Principal Problem:   Nausea Active Problems:   Epigastric pain   GERD (gastroesophageal reflux disease)   Elevated troponin   Hypokalemia   Anxiety   Prolonged QT interval   Precordial chest pain   Abnormal CT scan, stomach    Discharge Instructions  Discharge Instructions    Call MD for:  difficulty breathing, headache or visual disturbances    Complete by:  As directed    Call MD for:  extreme fatigue    Complete by:  As directed    Call MD for:  hives    Complete by:  As directed    Call MD for:  persistant dizziness or light-headedness    Complete by:  As directed    Call MD for:  persistant nausea and vomiting    Complete by:  As directed    Call MD for:  severe uncontrolled pain    Complete by:  As directed    Call MD for:  temperature >100.4    Complete by:  As directed    Diet - low sodium heart healthy    Complete by:  As directed    Discharge instructions    Complete by:  As directed    Please don't drive while on anxiety or pain medications. Please follow up with your  PCP in one week. You will be contacted for outpatient cardiac stress test, please follow up with cardiologist.   Increase activity slowly    Complete by:  As directed      Allergies as of 07/14/2017      Reactions   Cefaclor Diarrhea, Rash   hives   Sertraline    Suicidal thoughts, insomnia, nightmares    Amoxicillin-pot Clavulanate Diarrhea   Has patient had a PCN reaction causing immediate rash, facial/tongue/throat swelling, SOB or lightheadedness with hypotension: No Has patient had a PCN reaction causing severe rash involving mucus membranes or skin necrosis: No Has patient had a PCN reaction that required hospitalization: No Has patient had a PCN reaction occurring within the last 10 years: Yes If all of the above answers are "NO", then may proceed with Cephalosporin use.   Codeine Nausea And Vomiting   If pt takes a lot of this med   Latex Itching, Swelling   redness      Medication List    STOP taking these medications   dexlansoprazole 60 MG capsule Commonly known as:  DEXILANT     TAKE these medications   baclofen 20 MG tablet Commonly known as:  LIORESAL Take 20 mg by mouth 3 (three) times daily.   citalopram 40 MG tablet Commonly known as:  CELEXA Take 40 mg by mouth every evening.   diazepam 5 MG tablet Commonly known as:  VALIUM Take 1 tablet (5 mg total) by mouth at bedtime as needed for anxiety.   lidocaine 5 % Commonly known as:  LIDODERM Place 1 patch onto the skin as needed for pain.   ondansetron 4 MG tablet Commonly known as:  ZOFRAN Take 1 tablet (4 mg total) by mouth every 8 (eight) hours as needed for nausea or vomiting.   pantoprazole 40 MG tablet Commonly known as:  PROTONIX Take 1 tablet (40 mg total) by mouth daily.   potassium chloride SA 20 MEQ tablet Commonly known as:  K-DUR,KLOR-CON Take 1 tablet (20 mEq total) by mouth daily.   pregabalin 100 MG capsule Commonly known as:  LYRICA Take 100 mg by mouth 3 (three) times daily.   traMADol 50 MG tablet Commonly known as:  ULTRAM Take 1 tablet (50 mg total) by mouth every 6 (six) hours as needed. What changed:  reasons to take this      Follow-up Information    Runell GessBerry, Jonathan J, MD. Schedule an appointment as soon as possible for a visit in 2 week(s).   Specialties:   Cardiology, Radiology Contact information: [address] [place] [address] [phone]        Meryl DareStark, Malcolm T, MD. Schedule an appointment as soon as possible for a visit in 1 week(s).   Specialty:  Gastroenterology Why:  for biopsy result Contact information: [address] [place] [address] [phone]          Allergies  Allergen Reactions  . Cefaclor Diarrhea and Rash    hives  . Sertraline     Suicidal thoughts, insomnia, nightmares  . Amoxicillin-Pot Clavulanate Diarrhea    Has patient had a PCN reaction causing immediate rash, facial/tongue/throat swelling, SOB or lightheadedness with hypotension: No Has patient had a PCN reaction causing severe rash involving mucus membranes or skin necrosis: No Has patient had a PCN reaction that required hospitalization: No Has patient had a PCN reaction occurring within the last 10 years: Yes If all of the above answers are "NO", then  may proceed with Cephalosporin use.   . Codeine Nausea And Vomiting    If pt takes a lot of this med  . Latex Itching and Swelling    redness    Consultations: Cardiology GI  Procedures/Studies: Echocardiogram Endoscopy  Subjective: Seen and examined at bedside. Reported feeling good today. Denied headache, dizziness, nausea vomiting chest pain shortness of breath. She is very eager to go home today and does not want to stay in the hospital for possible cardiac stress test. She will follow up with cardiology outpatient for possibly stress test.  Discharge Exam: Vitals:   07/14/17 1100 07/14/17 1110  BP: 118/71 124/65  Pulse: 78 76  Resp: 16 17  Temp:    SpO2: 93% 94%   Vitals:   07/14/17 1006 07/14/17 1053 07/14/17 1100 07/14/17 1110  BP: 131/85 (!) 88/55 118/71 124/65  Pulse:  79 78 76  Resp: 16 15 16 17   Temp: 98 F (36.7 C) 97.6 F (36.4 C)    TempSrc: Oral Oral    SpO2: 95% 97% 93% 94%  Weight: 72.6 kg (160 lb)     Height: 5\' 6"  (1.676 m)        General: Pt is alert, awake, not in acute distress Cardiovascular: RRR, S1/S2 +, no rubs, no gallops Respiratory: CTA bilaterally, no wheezing, no rhonchi Abdominal: Soft, NT, ND, bowel sounds + Extremities: no edema, no cyanosis    The results of significant diagnostics from this hospitalization (including imaging, microbiology, ancillary and laboratory) are listed below for reference.     Microbiology: No results found for this or any previous visit (from the past 240 hour(s)).   Labs: BNP (last 3 results) No results for input(s): BNP in the last 8760 hours. Basic Metabolic Panel:  Recent Labs Lab 07/12/17 1210 07/12/17 2052 07/13/17 0608 07/14/17 0449  NA 140  --  142 143  K 2.9*  --  3.2* 4.0  CL 102  --  109 108  CO2 26  --  24 26  GLUCOSE 109*  --  91 107*  BUN <5*  --  <5* <5*  CREATININE 0.65  --  0.57 0.51  CALCIUM 9.1  --  8.2* 8.9  MG  --  1.8  --   --    Liver Function Tests:  Recent Labs Lab 07/12/17 1210  AST 20  ALT 13*  ALKPHOS 107  BILITOT 0.7  PROT 7.0  ALBUMIN 3.8    Recent Labs Lab 07/12/17 1210  LIPASE 23   No results for input(s): AMMONIA in the last 168 hours. CBC:  Recent Labs Lab 07/12/17 1210 07/13/17 0608  WBC 10.5 7.3  HGB 14.8 12.0  HCT 44.8 36.8  MCV 91.6 93.6  PLT 398 305   Cardiac Enzymes:  Recent Labs Lab 07/12/17 2052 07/13/17 0608  TROPONINI <0.03 <0.03   BNP: Invalid input(s): POCBNP CBG: No results for input(s): GLUCAP in the last 168 hours. D-Dimer  Recent Labs  07/12/17 2052  DDIMER <0.27   Hgb A1c No results for input(s): HGBA1C in the last 72 hours. Lipid Profile No results for input(s): CHOL, HDL, LDLCALC, TRIG, CHOLHDL, LDLDIRECT in the last 72 hours. Thyroid function studies No results for input(s): TSH, T4TOTAL, T3FREE, THYROIDAB in the last 72 hours.  Invalid input(s): FREET3 Anemia work up No results for input(s): VITAMINB12, FOLATE, FERRITIN, TIBC, IRON, RETICCTPCT in  the last 72 hours. Urinalysis    Component Value Date/Time   COLORURINE YELLOW 07/12/2017 1455   APPEARANCEUR  CLEAR 07/12/2017 1455   LABSPEC 1.005 07/12/2017 1455   PHURINE 8.0 07/12/2017 1455   GLUCOSEU NEGATIVE 07/12/2017 1455   HGBUR NEGATIVE 07/12/2017 1455   BILIRUBINUR NEGATIVE 07/12/2017 1455   KETONESUR 5 (A) 07/12/2017 1455   PROTEINUR NEGATIVE 07/12/2017 1455   NITRITE NEGATIVE 07/12/2017 1455   LEUKOCYTESUR NEGATIVE 07/12/2017 1455   Sepsis Labs Invalid input(s): PROCALCITONIN,  WBC,  LACTICIDVEN Microbiology No results found for this or any previous visit (from the past 240 hour(s)).   Time coordinating discharge: 32 minutes  SIGNED:   Maxie Barb, MD  [facility] 07/14/2017, 12:56 PM  If 7PM-7AM, please contact night-coverage [email or web address] Password [email or web address]

## 2017-07-14 NOTE — Progress Notes (Signed)
Pt given discharge instructions, prescriptions, and care notes. Pt verbalized understanding AEB no further questions or concerns at this time. IV was discontinued, no redness, pain, or swelling noted at this time. Telemetry discontinued and Centralized Telemetry was notified. Pt left the floor via wheelchair with staff in stable condition. 

## 2017-07-15 ENCOUNTER — Encounter: Payer: Self-pay | Admitting: Gastroenterology

## 2017-07-15 ENCOUNTER — Encounter (HOSPITAL_COMMUNITY): Payer: Self-pay | Admitting: Gastroenterology

## 2017-08-04 ENCOUNTER — Telehealth (HOSPITAL_COMMUNITY): Payer: Self-pay

## 2017-08-04 NOTE — Telephone Encounter (Signed)
Encounter complete. 

## 2017-08-09 ENCOUNTER — Inpatient Hospital Stay (HOSPITAL_COMMUNITY): Admission: RE | Admit: 2017-08-09 | Payer: Medicaid Other | Source: Ambulatory Visit

## 2017-08-25 ENCOUNTER — Encounter (HOSPITAL_COMMUNITY): Payer: Medicaid Other

## 2017-08-30 ENCOUNTER — Ambulatory Visit: Payer: Medicaid Other | Admitting: Gastroenterology

## 2017-08-31 ENCOUNTER — Encounter (HOSPITAL_COMMUNITY): Payer: Self-pay

## 2017-08-31 ENCOUNTER — Telehealth (HOSPITAL_COMMUNITY): Payer: Self-pay | Admitting: *Deleted

## 2017-08-31 ENCOUNTER — Emergency Department (HOSPITAL_COMMUNITY)
Admission: EM | Admit: 2017-08-31 | Discharge: 2017-08-31 | Disposition: A | Discharge status: Home / Self Care | Payer: Medicaid Other | Attending: Emergency Medicine | Admitting: Emergency Medicine

## 2017-08-31 DIAGNOSIS — R531 Weakness: Secondary | ICD-10-CM | POA: Insufficient documentation

## 2017-08-31 DIAGNOSIS — Z79899 Other long term (current) drug therapy: Secondary | ICD-10-CM | POA: Insufficient documentation

## 2017-08-31 DIAGNOSIS — E876 Hypokalemia: Secondary | ICD-10-CM | POA: Insufficient documentation

## 2017-08-31 DIAGNOSIS — R197 Diarrhea, unspecified: Secondary | ICD-10-CM | POA: Diagnosis not present

## 2017-08-31 DIAGNOSIS — K219 Gastro-esophageal reflux disease without esophagitis: Secondary | ICD-10-CM | POA: Insufficient documentation

## 2017-08-31 DIAGNOSIS — R112 Nausea with vomiting, unspecified: Secondary | ICD-10-CM | POA: Diagnosis present

## 2017-08-31 DIAGNOSIS — F17200 Nicotine dependence, unspecified, uncomplicated: Secondary | ICD-10-CM | POA: Insufficient documentation

## 2017-08-31 LAB — COMPREHENSIVE METABOLIC PANEL
ALBUMIN: 3.7 g/dL (ref 3.5–5.0)
ALK PHOS: 98 U/L (ref 38–126)
ALT: 32 U/L (ref 14–54)
ANION GAP: 8 (ref 5–15)
AST: 29 U/L (ref 15–41)
BILIRUBIN TOTAL: 0.5 mg/dL (ref 0.3–1.2)
BUN: 5 mg/dL — AB (ref 6–20)
CALCIUM: 8.9 mg/dL (ref 8.9–10.3)
CO2: 28 mmol/L (ref 22–32)
CREATININE: 0.65 mg/dL (ref 0.44–1.00)
Chloride: 103 mmol/L (ref 101–111)
GFR calc Af Amer: >60 mL/min (ref >60)
GFR calc non Af Amer: >60 mL/min (ref >60)
GLUCOSE: 112 mg/dL — AB (ref 65–99)
Potassium: 3.3 mmol/L — ABNORMAL LOW (ref 3.5–5.1)
Sodium: 139 mmol/L (ref 135–145)
TOTAL PROTEIN: 6.8 g/dL (ref 6.5–8.1)

## 2017-08-31 LAB — CBC WITH DIFFERENTIAL/PLATELET
BASOS ABS: 0 10*3/uL (ref 0.0–0.1)
BASOS PCT: 0 %
Eosinophils Absolute: 0.1 10*3/uL (ref 0.0–0.7)
Eosinophils Relative: 1 %
HEMATOCRIT: 43.1 % (ref 36.0–46.0)
HEMOGLOBIN: 14 g/dL (ref 12.0–15.0)
LYMPHS PCT: 18 %
Lymphs Abs: 1.5 10*3/uL (ref 0.7–4.0)
MCH: 29.7 pg (ref 26.0–34.0)
MCHC: 32.5 g/dL (ref 30.0–36.0)
MCV: 91.5 fL (ref 78.0–100.0)
MONO ABS: 0.7 10*3/uL (ref 0.1–1.0)
Monocytes Relative: 8 %
NEUTROS ABS: 6 10*3/uL (ref 1.7–7.7)
NEUTROS PCT: 73 %
Platelets: 327 10*3/uL (ref 150–400)
RBC: 4.71 MIL/uL (ref 3.87–5.11)
RDW: 14.4 % (ref 11.5–15.5)
WBC: 8.3 10*3/uL (ref 4.0–10.5)

## 2017-08-31 LAB — LIPASE, BLOOD: Lipase: 25 U/L (ref 11–51)

## 2017-08-31 MED ORDER — POTASSIUM CHLORIDE 20 MEQ/15ML (10%) PO SOLN
20.0000 meq | Freq: Once | ORAL | Status: AC
  Administered 2017-08-31: 20 meq via ORAL
  Filled 2017-08-31: qty 30

## 2017-08-31 MED ORDER — LORAZEPAM 2 MG/ML IJ SOLN
1.0000 mg | Freq: Once | INTRAMUSCULAR | Status: AC
  Administered 2017-08-31: 1 mg via INTRAVENOUS
  Filled 2017-08-31: qty 1

## 2017-08-31 MED ORDER — POTASSIUM CHLORIDE 10 MEQ/100ML IV SOLN: 10.0000 meq | Freq: Once | INTRAVENOUS | Status: DC

## 2017-08-31 MED ORDER — PROMETHAZINE HCL 25 MG PO TABS: 25.0000 mg | ORAL_TABLET | Freq: Four times a day (QID) | ORAL | 0 refills | Status: DC | PRN

## 2017-08-31 MED ORDER — SODIUM CHLORIDE 0.9 % IV BOLUS (SEPSIS)
1000.0000 mL | Freq: Once | INTRAVENOUS | Status: AC
  Administered 2017-08-31: 1000 mL via INTRAVENOUS

## 2017-08-31 NOTE — Telephone Encounter (Signed)
Patient given detailed instructions per Myocardial Perfusion Study Information Sheet for the test on 09/05/17. Patient notified to arrive 15 minutes early and that it is imperative to arrive on time for appointment to keep from having the test rescheduled.  If you need to cancel or reschedule your appointment, please call the office within 24 hours of your appointment. . Patient verbalized understanding. Razi Hickle Jacqueline    

## 2017-08-31 NOTE — Discharge Instructions (Signed)
Please continue taking Imodium for your persistent diarrhea.  Take phenergan for your nausea.  Your potasium level today is 3.3, which is an improvement from prior.  Follow up closely with your GI specialist for further management of your condition.

## 2017-08-31 NOTE — ED Notes (Signed)
PA notified of BP and advised that I can give Ativan IV

## 2017-08-31 NOTE — ED Triage Notes (Addendum)
Pt c/o n/v/d x 5 days.  Reports has had symptoms off and on since July.  Reports was put on potassium Monday for hypokalemia.  Pt orthostatic with ems.  EMS administered approx NS.  Pt 93% on room air, ems put pt on 2 liters of 02 and administered  zofran IV.  Reports no relief of nausea after zofran.  CBG 103.

## 2017-08-31 NOTE — ED Notes (Signed)
Pt requested iv to be taken out. Pt left and stated that she will come back for her discharge instructions.

## 2017-08-31 NOTE — ED Provider Notes (Signed)
[facility]-EMER[facility]ENCY DEPT Provider Note   CSN: [id number] Arrival date & time: 08/31/17  1001     History   Chief Complaint Chief Complaint  Patient presents with  . Emesis    HPI Debra [name] is a 51 y.o. female.  HPI   51 year old female with history of anxiety, depression, prolonged QT interval, GERD presenting for evaluation of nausea and vomiting.patient report this is her third ER visits this summer for complaints of nauseous, vomiting and diarrhea. patient initially develop her symptoms early July. She endorse persistent nausea, decreased appetite and having diarrhea. She was seen at [facility] in early August. A CT scan obtained at that time shown a thickened antrum. Patient underwent endoscopy which showed erythematous mucosa in antrum. Patient was recommended to take Protonix and Zofran as needed and to avoid NSAIDs. She was also noted to have low potassium. She received potassium supplementation, and discharged with potassium medication. Her symptoms did improve however a month later symptoms return. Patient was seen at an outside hospital in [place], [place].  Once again her potassium level was low.  She was given IVF and supplementation.  Sxs did improve however for the past 5 days she is now having the same symptoms.  She reported generalized weakness, lightheadedness and dizziness, decreased appetite due to her persistent nausea, having me eating much, having chest tightness and leg weakness. She feels nauseous all the time. She is unable to take her potassium supplementation due to nausea. She is having persistent loose stools in which he takes Imodium for. She is scheduled to have a cardiac stress test next week but she believes she may have to cancel it because she does not feel well. She denies any recent antibiotic use or any recent travel.  Past Medical History:  Diagnosis Date  . Anxiety    panic attack  . Dyspnea    with exertion  . GERD (gastroesophageal reflux disease)     . History of bleeding ulcers 1994  . Pneumonia 2013    Patient Active Problem List   Diagnosis Date Noted  . Abnormal CT scan, stomach   . Precordial chest pain   . Nausea 07/12/2017  . Epigastric pain 07/12/2017  . GERD (gastroesophageal reflux disease) 07/12/2017  . Elevated troponin 07/12/2017  . Hypokalemia 07/12/2017  . Anxiety 07/12/2017  . Prolonged QT interval 07/12/2017  . HNP (herniated nucleus pulposus), cervical 05/18/2017    Past Surgical History:  Procedure Laterality Date  . ABDOMINAL HYSTERECTOMY    . ANTERIOR CERVICAL DECOMP/DISCECTOMY FUSION N/A 05/18/2017   Procedure: ACDF - C6-C7;  Surgeon: Coletta Memos, MD;  Location: [facility] OR;  Service: Neurosurgery;  Laterality: N/A;  . BACK SURGERY     L 6 Fusion  . ESOPHAGOGASTRODUODENOSCOPY N/A 07/14/2017   Procedure: ESOPHAGOGASTRODUODENOSCOPY (EGD);  Surgeon: Meryl Dare, MD;  Location[facility]COPY;  Service: Endoscopy;  Laterality: N/A;  . TONSILLECTOMY    . UPPER GASTROINTESTINAL ENDOSCOPY  1994    OB History    No data available       Home Medications    Prior to Admission medications   Medication Sig Start Date End Date Taking? Authorizing Provider  baclofen (LIORESAL) 20 MG tablet Take 20 mg by mouth 3 (three) times daily.    [provider]  citalopram (CELEXA) 40 MG tablet Take 40 mg by mouth every evening.    [provider]  diazepam (VALIUM) 5 MG tablet Take 1 tablet (5 mg total) by mouth at bedtime as needed  for anxiety. 07/14/17   Maxie Barb, MD  lidocaine (LIDODERM) 5 % Place 1 patch onto the skin as needed for pain. 05/26/17   [provider]  ondansetron (ZOFRAN) 4 MG tablet Take 1 tablet (4 mg total) by mouth every 8 (eight) hours as needed for nausea or vomiting. 07/14/17   Maxie Barb, MD  pantoprazole (PROTONIX) 40 MG tablet Take 1 tablet (40 mg total) by mouth daily. 07/15/17   Maxie Barb, MD  potassium chloride SA (K-DUR,KLOR-CON)  20 MEQ tablet Take 1 tablet (20 mEq total) by mouth daily. 07/14/17   Maxie Barb, MD  pregabalin (LYRICA) 100 MG capsule Take 100 mg by mouth 3 (three) times daily.    [provider]  traMADol (ULTRAM) 50 MG tablet Take 1 tablet (50 mg total) by mouth every 6 (six) hours as needed. Patient taking differently: Take 50 mg by mouth every 6 (six) hours as needed for moderate pain.  05/19/17   Coletta Memos, MD    Family History Family History  Problem Relation Age of Onset  . Hypertension Mother   . COPD Mother   . Ulcers Father   . Heart disease Father   . Heart attack Paternal Uncle   . Heart attack Paternal Grandfather     Social History Social History  Substance Use Topics  . Smoking status: Current Every Day Smoker    Packs/day: 0.50    Years: 31.00  . Smokeless tobacco: Never Used  . Alcohol use No     Allergies   Cefaclor; Sertraline; Amoxicillin-pot clavulanate; Codeine; and Latex   Review of Systems Review of Systems  All other systems reviewed and are negative.    Physical Exam Updated Vital Signs BP 113/82 (BP Location: Right Arm)   Pulse 83   Temp 98.1 F (36.7 C) (Oral)   Resp 20   Ht  (1.676 m)   Wt 70.3 kg (155 lb)   SpO2 96%   BMI 25.02 kg/m   Physical Exam  Constitutional: She appears well-developed and well-nourished. No distress.  HENT:  Head: Atraumatic.  Mouth is dry  Eyes: Conjunctivae are normal.  Neck: Neck supple.  Cardiovascular: Normal rate, regular rhythm and intact distal pulses.   Pulmonary/Chest: Effort normal and breath sounds normal.  Abdominal: Soft. Bowel sounds are normal. She exhibits no distension. There is no tenderness.  Musculoskeletal:  -Global weakness without focal weakness  Neurological: She is alert. She displays normal reflexes.  Skin: Skin is dry. No rash noted.  Psychiatric: She has a normal mood and affect.  Nursing note and vitals reviewed.    ED Treatments / Results   Labs (all labs ordered are listed, but only abnormal results are displayed) Labs Reviewed  COMPREHENSIVE METABOLIC PANEL - Abnormal; Notable for the following:       Result Value   Potassium 3.3 (*)    Glucose, Bld 112 (*)    BUN 5 (*)    All other components within normal limits  CBC WITH DIFFERENTIAL/PLATELET  LIPASE, BLOOD  URINALYSIS, ROUTINE W REFLEX MICROSCOPIC    EKG  EKG Interpretation None     ED ECG REPORT   Date: 08/31/2017  Rate: 81  Rhythm: normal sinus rhythm  QRS Axis: normal  Intervals: borderline prolonged QT interval  ST/T Wave abnormalities: normal  Conduction Disutrbances:first-degree A-V block   Narrative Interpretation:   Old EKG Reviewed: unchanged  I have personally reviewed the EKG tracing and agree with the computerized  printout as noted.   Radiology No results found.  Procedures Procedures (including critical care time)  Medications Ordered in ED Medications - No data to display   Initial Impression / Assessment and Plan / ED Course  I have reviewed the triage vital signs and the nursing notes.  Pertinent labs & imaging results that were available during my care of the patient were reviewed by me and considered in my medical decision making (see chart for details).     BP 93/66   Pulse 74   Temp 98.1 F (36.7 C) (Oral)   Resp 20   Ht  (1.676 m)   Wt 70.3 kg (155 lb)   SpO2 96%   BMI 25.02 kg/m    Final Clinical Impressions(s) / ED Diagnoses   Final diagnoses:  Nausea vomiting and diarrhea  Hypokalemia    New Prescriptions New Prescriptions   No medications on file   10:37 AM Pt with recurrent nausea, vomiting and diarrhea and recently recurring hypokalemia state.  She is here with the same complaints.  Work up initiated.  She is well appearing. Care discussed with Dr. Adriana Simas.    11:35 AM Labs are reassuring. Mild hypokalemia with potassium of 3.3 supplementation given, EKG without concerning arrhythmia or  ischemic changes. Recommend f/u outpt with GI for further care.  Pt voice understansding and agrees with plan.    Fayrene Helper, PA-C 08/31/17 1201    Donnetta Hutching, MD 09/03/17 [phone]

## 2017-09-05 ENCOUNTER — Ambulatory Visit (HOSPITAL_COMMUNITY): Payer: Medicaid Other | Attending: Internal Medicine

## 2017-09-05 ENCOUNTER — Encounter (HOSPITAL_COMMUNITY): Payer: Medicaid Other

## 2017-09-05 DIAGNOSIS — R0609 Other forms of dyspnea: Secondary | ICD-10-CM | POA: Diagnosis present

## 2017-09-05 DIAGNOSIS — R079 Chest pain, unspecified: Secondary | ICD-10-CM

## 2017-09-05 DIAGNOSIS — I2510 Atherosclerotic heart disease of native coronary artery without angina pectoris: Secondary | ICD-10-CM | POA: Diagnosis not present

## 2017-09-05 LAB — MYOCARDIAL PERFUSION IMAGING
CHL CUP NUCLEAR SDS: 4
CHL CUP NUCLEAR SRS: 9
CSEPEW: 4.6 METS
Exercise duration (min): 3 min
Exercise duration (sec): 30 s
LV dias vol: 75 mL (ref 46–106)
LVSYSVOL: 38 mL
MPHR: 169 {beats}/min
NUC STRESS TID: 1.13
Peak HR: 150 {beats}/min
Percent HR: 88 %
RATE: 0.31
RPE: 19
Rest HR: 89 {beats}/min
SSS: 13

## 2017-09-05 MED ORDER — TECHNETIUM TC 99M TETROFOSMIN IV KIT
10.2000 | PACK | Freq: Once | INTRAVENOUS | Status: AC | PRN
  Administered 2017-09-05: 10.2 via INTRAVENOUS
  Filled 2017-09-05: qty 11

## 2017-09-05 MED ORDER — TECHNETIUM TC 99M TETROFOSMIN IV KIT
33.0000 | PACK | Freq: Once | INTRAVENOUS | Status: AC | PRN
  Administered 2017-09-05: 33 via INTRAVENOUS
  Filled 2017-09-05: qty 33

## 2017-11-25 ENCOUNTER — Encounter: Payer: Self-pay | Admitting: Emergency Medicine

## 2018-01-24 ENCOUNTER — Ambulatory Visit: Payer: Medicaid Other | Admitting: Gastroenterology

## 2018-05-25 ENCOUNTER — Other Ambulatory Visit: Payer: Self-pay | Admitting: Neurosurgery

## 2018-06-05 ENCOUNTER — Encounter (HOSPITAL_COMMUNITY): Payer: Self-pay | Admitting: *Deleted

## 2018-06-05 ENCOUNTER — Other Ambulatory Visit: Payer: Self-pay

## 2018-06-05 NOTE — Progress Notes (Signed)
Spoke with pt for pre-op call. Pt denies cardiac history. States she was worked up last year in August for chest pain and nausea, found to have low potassium. Pt states she has been diagnosed with COPD and her Pulmonologist, [place]. [place]son [place]enderson [place], she asked if we had it and I told her no. She states [facility] Sueanne Marg[facility]ell[facility] office. She states if it's not there she will have her mother go by the office to pick a copy up. I called Jessica at Dr. Sueanne Margaritaabbell's office and she states she hasn't received it yet. She will check on it and will call pt back about it.

## 2018-06-07 ENCOUNTER — Other Ambulatory Visit: Payer: Self-pay

## 2018-06-07 ENCOUNTER — Encounter (HOSPITAL_COMMUNITY)
Admission: RE | Disposition: A | Discharge status: Home Health | Payer: Self-pay | Source: Ambulatory Visit | Attending: Neurosurgery

## 2018-06-07 ENCOUNTER — Inpatient Hospital Stay (HOSPITAL_COMMUNITY): Payer: Medicaid Other | Admitting: Certified Registered"

## 2018-06-07 ENCOUNTER — Encounter (HOSPITAL_COMMUNITY): Payer: Self-pay | Admitting: Certified Registered Nurse Anesthetist

## 2018-06-07 ENCOUNTER — Inpatient Hospital Stay (HOSPITAL_COMMUNITY)
Admission: RE | Admit: 2018-06-07 | Discharge: 2018-06-11 | DRG: 460 | Disposition: A | Discharge status: Home Health | Payer: Medicaid Other | Source: Ambulatory Visit | Attending: Neurosurgery | Admitting: Neurosurgery

## 2018-06-07 ENCOUNTER — Inpatient Hospital Stay (HOSPITAL_COMMUNITY): Payer: Medicaid Other

## 2018-06-07 DIAGNOSIS — F419 Anxiety disorder, unspecified: Secondary | ICD-10-CM | POA: Diagnosis present

## 2018-06-07 DIAGNOSIS — Z981 Arthrodesis status: Secondary | ICD-10-CM | POA: Diagnosis not present

## 2018-06-07 DIAGNOSIS — Z825 Family history of asthma and other chronic lower respiratory diseases: Secondary | ICD-10-CM

## 2018-06-07 DIAGNOSIS — Z881 Allergy status to other antibiotic agents status: Secondary | ICD-10-CM | POA: Diagnosis not present

## 2018-06-07 DIAGNOSIS — Z91040 Latex allergy status: Secondary | ICD-10-CM | POA: Diagnosis not present

## 2018-06-07 DIAGNOSIS — M4727 Other spondylosis with radiculopathy, lumbosacral region: Secondary | ICD-10-CM | POA: Diagnosis present

## 2018-06-07 DIAGNOSIS — F17210 Nicotine dependence, cigarettes, uncomplicated: Secondary | ICD-10-CM | POA: Diagnosis present

## 2018-06-07 DIAGNOSIS — M4316 Spondylolisthesis, lumbar region: Principal | ICD-10-CM | POA: Diagnosis present

## 2018-06-07 DIAGNOSIS — Z886 Allergy status to analgesic agent status: Secondary | ICD-10-CM | POA: Diagnosis not present

## 2018-06-07 DIAGNOSIS — K219 Gastro-esophageal reflux disease without esophagitis: Secondary | ICD-10-CM | POA: Diagnosis present

## 2018-06-07 DIAGNOSIS — Z885 Allergy status to narcotic agent status: Secondary | ICD-10-CM

## 2018-06-07 DIAGNOSIS — Z90710 Acquired absence of both cervix and uterus: Secondary | ICD-10-CM

## 2018-06-07 DIAGNOSIS — J449 Chronic obstructive pulmonary disease, unspecified: Secondary | ICD-10-CM | POA: Diagnosis present

## 2018-06-07 DIAGNOSIS — Z419 Encounter for procedure for purposes other than remedying health state, unspecified: Secondary | ICD-10-CM

## 2018-06-07 DIAGNOSIS — Z8249 Family history of ischemic heart disease and other diseases of the circulatory system: Secondary | ICD-10-CM

## 2018-06-07 HISTORY — DX: Chronic obstructive pulmonary disease, unspecified: J44.9

## 2018-06-07 HISTORY — DX: Other complications of anesthesia, initial encounter: T88.59XA

## 2018-06-07 HISTORY — DX: Unspecified osteoarthritis, unspecified site: M19.90

## 2018-06-07 HISTORY — DX: Personal history of other endocrine, nutritional and metabolic disease: Z86.39

## 2018-06-07 HISTORY — DX: Low back pain, unspecified: M54.50

## 2018-06-07 HISTORY — DX: Personal history of other diseases of the digestive system: Z87.19

## 2018-06-07 HISTORY — DX: Low back pain: M54.5

## 2018-06-07 HISTORY — DX: Other chronic pain: G89.29

## 2018-06-07 HISTORY — PX: POSTERIOR LUMBAR FUSION: SHX6036

## 2018-06-07 HISTORY — DX: Adverse effect of unspecified anesthetic, initial encounter: T41.45XA

## 2018-06-07 LAB — ABO/RH: ABO/RH(D): O POS

## 2018-06-07 SURGERY — POSTERIOR LUMBAR FUSION 1 LEVEL
Anesthesia: General

## 2018-06-07 MED ORDER — HYDROMORPHONE HCL 1 MG/ML IJ SOLN
0.2500 mg | INTRAMUSCULAR | Status: DC | PRN
  Administered 2018-06-07 (×4): 0.5 mg via INTRAVENOUS

## 2018-06-07 MED ORDER — SODIUM CHLORIDE 0.9% FLUSH: 3.0000 mL | INTRAVENOUS | Status: DC | PRN

## 2018-06-07 MED ORDER — 0.9 % SODIUM CHLORIDE (POUR BTL) OPTIME
TOPICAL | Status: DC | PRN
  Administered 2018-06-07: 1000 mL

## 2018-06-07 MED ORDER — ACETAMINOPHEN 325 MG PO TABS
650.0000 mg | ORAL_TABLET | ORAL | Status: DC | PRN
  Administered 2018-06-09 – 2018-06-11 (×5): 650 mg via ORAL
  Filled 2018-06-07 (×5): qty 2

## 2018-06-07 MED ORDER — PHENOL 1.4 % MT LIQD: 1.0000 | OROMUCOSAL | Status: DC | PRN

## 2018-06-07 MED ORDER — HYDROMORPHONE HCL 1 MG/ML IJ SOLN
INTRAMUSCULAR | Status: AC
  Filled 2018-06-07: qty 1

## 2018-06-07 MED ORDER — ACETAMINOPHEN 500 MG PO TABS
1000.0000 mg | ORAL_TABLET | Freq: Four times a day (QID) | ORAL | Status: AC
  Administered 2018-06-07 – 2018-06-08 (×3): 1000 mg via ORAL
  Filled 2018-06-07 (×3): qty 2

## 2018-06-07 MED ORDER — SENNOSIDES-DOCUSATE SODIUM 8.6-50 MG PO TABS
1.0000 | ORAL_TABLET | Freq: Every evening | ORAL | Status: DC | PRN
  Administered 2018-06-07: 1 via ORAL
  Filled 2018-06-07: qty 1

## 2018-06-07 MED ORDER — DIAZEPAM 5 MG PO TABS
ORAL_TABLET | ORAL | Status: AC
  Filled 2018-06-07: qty 1

## 2018-06-07 MED ORDER — ONDANSETRON HCL 4 MG/2ML IJ SOLN: 4.0000 mg | Freq: Four times a day (QID) | INTRAMUSCULAR | Status: DC | PRN

## 2018-06-07 MED ORDER — BISACODYL 5 MG PO TBEC: 5.0000 mg | DELAYED_RELEASE_TABLET | Freq: Every day | ORAL | Status: DC | PRN

## 2018-06-07 MED ORDER — LIDOCAINE HCL (CARDIAC) PF 100 MG/5ML IV SOSY
PREFILLED_SYRINGE | INTRAVENOUS | Status: DC | PRN
  Administered 2018-06-07: 30 mg via INTRAVENOUS

## 2018-06-07 MED ORDER — VANCOMYCIN HCL IN DEXTROSE 1-5 GM/200ML-% IV SOLN
INTRAVENOUS | Status: AC
  Administered 2018-06-07: 1000 mg via INTRAVENOUS
  Filled 2018-06-07: qty 200

## 2018-06-07 MED ORDER — SCOPOLAMINE 1 MG/3DAYS TD PT72
1.0000 | MEDICATED_PATCH | Freq: Once | TRANSDERMAL | Status: DC
  Administered 2018-06-07: 1.5 mg via TRANSDERMAL

## 2018-06-07 MED ORDER — MAGNESIUM CITRATE PO SOLN: 1.0000 | Freq: Once | ORAL | Status: DC | PRN

## 2018-06-07 MED ORDER — POTASSIUM CHLORIDE IN NACL 20-0.9 MEQ/L-% IV SOLN
INTRAVENOUS | Status: DC
  Administered 2018-06-08: via INTRAVENOUS
  Filled 2018-06-07: qty 1000

## 2018-06-07 MED ORDER — OXYCODONE HCL 5 MG PO TABS
ORAL_TABLET | ORAL | Status: AC
  Filled 2018-06-07: qty 2

## 2018-06-07 MED ORDER — ACETAMINOPHEN 650 MG RE SUPP: 650.0000 mg | RECTAL | Status: DC | PRN

## 2018-06-07 MED ORDER — THROMBIN 20000 UNITS EX SOLR
CUTANEOUS | Status: AC
Start: 2018-06-07 — End: ?
  Filled 2018-06-07: qty 20000

## 2018-06-07 MED ORDER — TIOTROPIUM BROMIDE MONOHYDRATE 18 MCG IN CAPS
1.0000 | ORAL_CAPSULE | Freq: Every day | RESPIRATORY_TRACT | Status: DC
  Filled 2018-06-07: qty 5

## 2018-06-07 MED ORDER — CHLORHEXIDINE GLUCONATE CLOTH 2 % EX PADS: 6.0000 | MEDICATED_PAD | Freq: Once | CUTANEOUS | Status: DC

## 2018-06-07 MED ORDER — MOMETASONE FURO-FORMOTEROL FUM 200-5 MCG/ACT IN AERO
2.0000 | INHALATION_SPRAY | Freq: Two times a day (BID) | RESPIRATORY_TRACT | Status: DC
  Administered 2018-06-08 – 2018-06-11 (×7): 2 via RESPIRATORY_TRACT
  Filled 2018-06-07: qty 8.8

## 2018-06-07 MED ORDER — THROMBIN 20000 UNITS EX SOLR
CUTANEOUS | Status: DC | PRN
  Administered 2018-06-07: 14:00:00 via TOPICAL

## 2018-06-07 MED ORDER — MOMETASONE FURO-FORMOTEROL FUM 200-5 MCG/ACT IN AERO
2.0000 | INHALATION_SPRAY | Freq: Two times a day (BID) | RESPIRATORY_TRACT | Status: DC
  Filled 2018-06-07: qty 8.8

## 2018-06-07 MED ORDER — OXYCODONE HCL 5 MG PO TABS
10.0000 mg | ORAL_TABLET | ORAL | Status: DC | PRN
  Administered 2018-06-07 – 2018-06-11 (×16): 10 mg via ORAL
  Filled 2018-06-07 (×18): qty 2

## 2018-06-07 MED ORDER — MIDAZOLAM HCL 2 MG/2ML IJ SOLN
INTRAMUSCULAR | Status: AC
  Filled 2018-06-07: qty 2

## 2018-06-07 MED ORDER — DOCUSATE SODIUM 100 MG PO CAPS
100.0000 mg | ORAL_CAPSULE | Freq: Two times a day (BID) | ORAL | Status: DC
  Administered 2018-06-07 – 2018-06-11 (×8): 100 mg via ORAL
  Filled 2018-06-07 (×8): qty 1

## 2018-06-07 MED ORDER — HYDROMORPHONE HCL 1 MG/ML IJ SOLN
0.5000 mg | INTRAMUSCULAR | Status: AC | PRN
  Administered 2018-06-07 (×2): 0.5 mg via INTRAVENOUS

## 2018-06-07 MED ORDER — OXYCODONE HCL ER 10 MG PO T12A
10.0000 mg | EXTENDED_RELEASE_TABLET | Freq: Two times a day (BID) | ORAL | Status: DC
  Administered 2018-06-07 – 2018-06-11 (×8): 10 mg via ORAL
  Filled 2018-06-07 (×8): qty 1

## 2018-06-07 MED ORDER — ALBUTEROL SULFATE (2.5 MG/3ML) 0.083% IN NEBU: 3.0000 mL | INHALATION_SOLUTION | Freq: Four times a day (QID) | RESPIRATORY_TRACT | Status: DC | PRN

## 2018-06-07 MED ORDER — MIDAZOLAM HCL 2 MG/2ML IJ SOLN: 0.5000 mg | Freq: Once | INTRAMUSCULAR | Status: DC | PRN

## 2018-06-07 MED ORDER — FENTANYL CITRATE (PF) 250 MCG/5ML IJ SOLN
INTRAMUSCULAR | Status: AC
  Filled 2018-06-07: qty 5

## 2018-06-07 MED ORDER — PREGABALIN 100 MG PO CAPS
100.0000 mg | ORAL_CAPSULE | Freq: Two times a day (BID) | ORAL | Status: DC
Start: 2018-06-07 — End: 2018-06-11
  Administered 2018-06-07 – 2018-06-11 (×8): 100 mg via ORAL
  Filled 2018-06-07 (×8): qty 1

## 2018-06-07 MED ORDER — LACTATED RINGERS IV SOLN
INTRAVENOUS | Status: DC
  Administered 2018-06-07 (×2): via INTRAVENOUS

## 2018-06-07 MED ORDER — ZOLPIDEM TARTRATE 5 MG PO TABS
5.0000 mg | ORAL_TABLET | Freq: Every evening | ORAL | Status: DC | PRN
  Administered 2018-06-09 – 2018-06-10 (×2): 5 mg via ORAL
  Filled 2018-06-07 (×2): qty 1

## 2018-06-07 MED ORDER — VANCOMYCIN HCL IN DEXTROSE 1-5 GM/200ML-% IV SOLN
1000.0000 mg | INTRAVENOUS | Status: AC
  Administered 2018-06-07: 1000 mg via INTRAVENOUS

## 2018-06-07 MED ORDER — ONDANSETRON HCL 4 MG PO TABS: 4.0000 mg | ORAL_TABLET | Freq: Four times a day (QID) | ORAL | Status: DC | PRN

## 2018-06-07 MED ORDER — LIDOCAINE-EPINEPHRINE 0.5 %-1:200000 IJ SOLN
INTRAMUSCULAR | Status: AC
  Filled 2018-06-07: qty 1

## 2018-06-07 MED ORDER — ONDANSETRON HCL 4 MG/2ML IJ SOLN
INTRAMUSCULAR | Status: DC | PRN
  Administered 2018-06-07: 4 mg via INTRAVENOUS

## 2018-06-07 MED ORDER — SODIUM CHLORIDE 0.9% FLUSH
3.0000 mL | Freq: Two times a day (BID) | INTRAVENOUS | Status: DC
  Administered 2018-06-08 – 2018-06-10 (×3): 3 mL via INTRAVENOUS

## 2018-06-07 MED ORDER — BUPIVACAINE HCL (PF) 0.5 % IJ SOLN
INTRAMUSCULAR | Status: AC
  Filled 2018-06-07: qty 30

## 2018-06-07 MED ORDER — SUGAMMADEX SODIUM 200 MG/2ML IV SOLN
INTRAVENOUS | Status: DC | PRN
  Administered 2018-06-07: 200 mg via INTRAVENOUS

## 2018-06-07 MED ORDER — DEXAMETHASONE SODIUM PHOSPHATE 4 MG/ML IJ SOLN
INTRAMUSCULAR | Status: DC | PRN
  Administered 2018-06-07: 5 mg via INTRAVENOUS

## 2018-06-07 MED ORDER — BUPIVACAINE HCL (PF) 0.5 % IJ SOLN
INTRAMUSCULAR | Status: DC | PRN
  Administered 2018-06-07: 15 mL

## 2018-06-07 MED ORDER — ROCURONIUM BROMIDE 100 MG/10ML IV SOLN
INTRAVENOUS | Status: DC | PRN
  Administered 2018-06-07 (×3): 50 mg via INTRAVENOUS

## 2018-06-07 MED ORDER — MIDAZOLAM HCL 5 MG/5ML IJ SOLN
INTRAMUSCULAR | Status: DC | PRN
  Administered 2018-06-07: 2 mg via INTRAVENOUS

## 2018-06-07 MED ORDER — PROPOFOL 10 MG/ML IV BOLUS
INTRAVENOUS | Status: AC
Start: 2018-06-07 — End: ?
  Filled 2018-06-07: qty 20

## 2018-06-07 MED ORDER — LIDOCAINE-EPINEPHRINE 0.5 %-1:200000 IJ SOLN
INTRAMUSCULAR | Status: DC | PRN
  Administered 2018-06-07: 15 mL

## 2018-06-07 MED ORDER — CITALOPRAM HYDROBROMIDE 40 MG PO TABS
40.0000 mg | ORAL_TABLET | Freq: Every evening | ORAL | Status: DC
  Administered 2018-06-07 – 2018-06-10 (×4): 40 mg via ORAL
  Filled 2018-06-07 (×5): qty 1

## 2018-06-07 MED ORDER — MENTHOL 3 MG MT LOZG: 1.0000 | LOZENGE | OROMUCOSAL | Status: DC | PRN

## 2018-06-07 MED ORDER — PROPOFOL 10 MG/ML IV BOLUS
INTRAVENOUS | Status: DC | PRN
  Administered 2018-06-07: 150 mg via INTRAVENOUS

## 2018-06-07 MED ORDER — PANTOPRAZOLE SODIUM 40 MG PO TBEC
40.0000 mg | DELAYED_RELEASE_TABLET | Freq: Every day | ORAL | Status: DC
  Administered 2018-06-07 – 2018-06-11 (×5): 40 mg via ORAL
  Filled 2018-06-07 (×5): qty 1

## 2018-06-07 MED ORDER — OXYCODONE HCL 5 MG PO TABS
5.0000 mg | ORAL_TABLET | ORAL | Status: DC | PRN
  Administered 2018-06-09 (×2): 5 mg via ORAL
  Filled 2018-06-07 (×3): qty 1

## 2018-06-07 MED ORDER — SCOPOLAMINE 1 MG/3DAYS TD PT72
MEDICATED_PATCH | TRANSDERMAL | Status: AC
  Administered 2018-06-07: 1.5 mg via TRANSDERMAL
  Filled 2018-06-07: qty 1

## 2018-06-07 MED ORDER — MEPERIDINE HCL 50 MG/ML IJ SOLN: 6.2500 mg | INTRAMUSCULAR | Status: DC | PRN

## 2018-06-07 MED ORDER — SODIUM CHLORIDE 0.9 % IV SOLN: 250.0000 mL | INTRAVENOUS | Status: DC

## 2018-06-07 MED ORDER — POTASSIUM CHLORIDE CRYS ER 10 MEQ PO TBCR
10.0000 meq | EXTENDED_RELEASE_TABLET | Freq: Every day | ORAL | Status: DC
  Administered 2018-06-07 – 2018-06-11 (×5): 10 meq via ORAL
  Filled 2018-06-07 (×5): qty 1

## 2018-06-07 MED ORDER — DIAZEPAM 5 MG PO TABS
5.0000 mg | ORAL_TABLET | Freq: Four times a day (QID) | ORAL | Status: DC | PRN
  Administered 2018-06-07 – 2018-06-11 (×12): 5 mg via ORAL
  Filled 2018-06-07 (×12): qty 1

## 2018-06-07 MED ORDER — TIOTROPIUM BROMIDE MONOHYDRATE 18 MCG IN CAPS
1.0000 | ORAL_CAPSULE | Freq: Every day | RESPIRATORY_TRACT | Status: DC
  Administered 2018-06-08 – 2018-06-11 (×4): 18 ug via RESPIRATORY_TRACT
  Filled 2018-06-07: qty 5

## 2018-06-07 MED ORDER — FENTANYL CITRATE (PF) 100 MCG/2ML IJ SOLN
INTRAMUSCULAR | Status: DC | PRN
  Administered 2018-06-07: 50 ug via INTRAVENOUS
  Administered 2018-06-07: 150 ug via INTRAVENOUS
  Administered 2018-06-07 (×2): 50 ug via INTRAVENOUS

## 2018-06-07 SURGICAL SUPPLY — 65 items
BENZOIN TINCTURE PRP APPL 2/3 (GAUZE/BANDAGES/DRESSINGS) IMPLANT
BLADE CLIPPER SURG (BLADE) IMPLANT
BUR MATCHSTICK NEURO 3.0 LAGG (BURR) ×3 IMPLANT
BUR PRECISION FLUTE 5.0 (BURR) ×3 IMPLANT
CABLE BIPOLOR RESECTION CORD (MISCELLANEOUS) ×3 IMPLANT
CANISTER SUCT 3000ML PPV (MISCELLANEOUS) ×3 IMPLANT
CAP RELINE MOD TULIP RMM (Cap) ×12 IMPLANT
CARTRIDGE OIL MAESTRO DRILL (MISCELLANEOUS) ×1 IMPLANT
CLOSURE WOUND 1/2 X4 (GAUZE/BANDAGES/DRESSINGS)
CONT SPEC 4OZ CLIKSEAL STRL BL (MISCELLANEOUS) ×3 IMPLANT
COVER BACK TABLE 60X90IN (DRAPES) ×3 IMPLANT
DECANTER SPIKE VIAL GLASS SM (MISCELLANEOUS) ×6 IMPLANT
DERMABOND ADVANCED (GAUZE/BANDAGES/DRESSINGS) ×2
DERMABOND ADVANCED .7 DNX12 (GAUZE/BANDAGES/DRESSINGS) ×1 IMPLANT
DIFFUSER DRILL AIR PNEUMATIC (MISCELLANEOUS) ×3 IMPLANT
DRAPE C-ARM 42X72 X-RAY (DRAPES) ×6 IMPLANT
DRAPE C-ARMOR (DRAPES) ×3 IMPLANT
DRAPE LAPAROTOMY 100X72X124 (DRAPES) ×3 IMPLANT
DRAPE POUCH INSTRU U-SHP 10X18 (DRAPES) ×3 IMPLANT
DRAPE SURG 17X23 STRL (DRAPES) ×3 IMPLANT
DRSG OPSITE POSTOP 4X6 (GAUZE/BANDAGES/DRESSINGS) ×3 IMPLANT
DURAPREP 26ML APPLICATOR (WOUND CARE) ×3 IMPLANT
ELECT REM PT RETURN 9FT ADLT (ELECTROSURGICAL) ×3
ELECTRODE REM PT RTRN 9FT ADLT (ELECTROSURGICAL) ×1 IMPLANT
GAUZE SPONGE 4X4 12PLY STRL (GAUZE/BANDAGES/DRESSINGS) IMPLANT
GAUZE SPONGE 4X4 16PLY XRAY LF (GAUZE/BANDAGES/DRESSINGS) IMPLANT
GLOVE BIOGEL PI IND STRL 6.5 (GLOVE) ×1 IMPLANT
GLOVE BIOGEL PI IND STRL 7.5 (GLOVE) ×2 IMPLANT
GLOVE BIOGEL PI INDICATOR 6.5 (GLOVE) ×2
GLOVE BIOGEL PI INDICATOR 7.5 (GLOVE) ×4
GLOVE SURG SS PI 7.0 STRL IVOR (GLOVE) ×12 IMPLANT
GOWN STRL REUS W/ TWL LRG LVL3 (GOWN DISPOSABLE) ×2 IMPLANT
GOWN STRL REUS W/ TWL XL LVL3 (GOWN DISPOSABLE) ×3 IMPLANT
GOWN STRL REUS W/TWL 2XL LVL3 (GOWN DISPOSABLE) IMPLANT
GOWN STRL REUS W/TWL LRG LVL3 (GOWN DISPOSABLE) ×4
GOWN STRL REUS W/TWL XL LVL3 (GOWN DISPOSABLE) ×6
KIT BASIN OR (CUSTOM PROCEDURE TRAY) ×3 IMPLANT
KIT POSITION SURG JACKSON T1 (MISCELLANEOUS) ×3 IMPLANT
KIT TURNOVER KIT B (KITS) ×3 IMPLANT
MILL MEDIUM DISP (BLADE) ×3 IMPLANT
NEEDLE HYPO 25X1 1.5 SAFETY (NEEDLE) ×3 IMPLANT
NEEDLE SPNL 18GX3.5 QUINCKE PK (NEEDLE) ×3 IMPLANT
NS IRRIG 1000ML POUR BTL (IV SOLUTION) ×3 IMPLANT
OIL CARTRIDGE MAESTRO DRILL (MISCELLANEOUS) ×3
PACK LAMINECTOMY NEURO (CUSTOM PROCEDURE TRAY) ×3 IMPLANT
PAD ARMBOARD 7.5X6 YLW CONV (MISCELLANEOUS) ×6 IMPLANT
ROD RELINE COCR LORD 5.0X35 (Rod) ×6 IMPLANT
SCREW LOCK RSS 4.5/5.0MM (Screw) ×12 IMPLANT
SCREW SHANK RELINE MOD 5.5X30 (Screw) ×6 IMPLANT
SCREW SHANK RELINE MOD 5.5X35 (Screw) ×6 IMPLANT
SPACER OPAL 10X24 (Orthopedic Implant) ×6 IMPLANT
SPONGE LAP 4X18 RFD (DISPOSABLE) IMPLANT
SPONGE SURGIFOAM ABS GEL 100 (HEMOSTASIS) ×3 IMPLANT
STRIP CLOSURE SKIN 1/2X4 (GAUZE/BANDAGES/DRESSINGS) IMPLANT
SUT PROLENE 6 0 BV (SUTURE) IMPLANT
SUT VIC AB 0 CT1 18XCR BRD8 (SUTURE) ×1 IMPLANT
SUT VIC AB 0 CT1 8-18 (SUTURE) ×2
SUT VIC AB 2-0 CT1 18 (SUTURE) ×3 IMPLANT
SUT VIC AB 3-0 SH 8-18 (SUTURE) ×6 IMPLANT
TOWEL GREEN STERILE (TOWEL DISPOSABLE) ×3 IMPLANT
TOWEL GREEN STERILE FF (TOWEL DISPOSABLE) ×3 IMPLANT
TRAY FOL W/BAG SLVR 16FR STRL (SET/KITS/TRAYS/PACK) ×1 IMPLANT
TRAY FOLEY MTR SLVR 16FR STAT (SET/KITS/TRAYS/PACK) IMPLANT
TRAY FOLEY W/BAG SLVR 16FR LF (SET/KITS/TRAYS/PACK) ×2
WATER STERILE IRR 1000ML POUR (IV SOLUTION) ×3 IMPLANT

## 2018-06-07 NOTE — Anesthesia Postprocedure Evaluation (Signed)
Anesthesia Post Note  Patient: Azucena FallenMichelle Wandel  Procedure(s) Performed: Lumbar Five Sacral One Posterior lumbar interbody fusion (N/A )     Patient location during evaluation: PACU Anesthesia Type: General Level of consciousness: awake and alert Pain management: pain level controlled Vital Signs Assessment: post-procedure vital signs reviewed and stable Respiratory status: spontaneous breathing, nonlabored ventilation, respiratory function stable and patient connected to nasal cannula oxygen Cardiovascular status: blood pressure returned to baseline and stable Postop Assessment: no apparent nausea or vomiting Anesthetic complications: no    Last Vitals:  Vitals:   06/07/18 1842 06/07/18 1857  BP: 119/72 111/70  Pulse: 87 92  Resp: 15 13  Temp:    SpO2: 97% 93%    Last Pain:  Vitals:   06/07/18 1811  TempSrc:   PainSc: Asleep                 Macyn Remmert DAVID

## 2018-06-07 NOTE — Transfer of Care (Signed)
Immediate Anesthesia Transfer of Care Note  Patient: Azucena FallenMichelle Katzenstein  Procedure(s) Performed: Lumbar Five Sacral One Posterior lumbar interbody fusion (N/A )  Patient Location: PACU  Anesthesia Type:General  Level of Consciousness: awake, alert , oriented and patient cooperative  Airway & Oxygen Therapy: Patient Spontanous Breathing  Post-op Assessment: Report given to RN and Post -op Vital signs reviewed and stable  Post vital signs: Reviewed and stable  Last Vitals:  Vitals Value Taken Time  BP 112/76 06/07/2018  5:27 PM  Temp    Pulse 95 06/07/2018  5:28 PM  Resp 19 06/07/2018  5:28 PM  SpO2 94 % 06/07/2018  5:28 PM  Vitals shown include unvalidated device data.  Last Pain:  Vitals:   06/07/18 1251  TempSrc:   PainSc: 6       Patients Stated Pain Goal: 3 (06/07/18 1251)  Complications: No apparent anesthesia complications

## 2018-06-07 NOTE — Anesthesia Preprocedure Evaluation (Addendum)
Anesthesia Evaluation  Patient identified by MRN, date of birth, ID band Patient awake    Reviewed: Allergy & Precautions, NPO status , Patient's Chart, lab work & pertinent test results  History of Anesthesia Complications Negative for: history of anesthetic complications  Airway Mallampati: II  TM Distance: >3 FB Neck ROM: Full    Dental  (+) Dental Advisory Given   Pulmonary COPD,  COPD inhaler, Current Smoker,    breath sounds clear to auscultation       Cardiovascular (-) anginanegative cardio ROS   Rhythm:Regular Rate:Normal  10/18 stress: Low risk stress nuclear study with no ischemia or infarction; EF 49 with mild global hypokinesis 8/18 ECHO: EF 60-65%, valves OK   Neuro/Psych Chronic back pain    GI/Hepatic Neg liver ROS, GERD  Medicated and Controlled,  Endo/Other  negative endocrine ROS  Renal/GU negative Renal ROS     Musculoskeletal  (+) Arthritis ,   Abdominal   Peds  Hematology negative hematology ROS (+)   Anesthesia Other Findings   Reproductive/Obstetrics                             Anesthesia Physical Anesthesia Plan  ASA: III  Anesthesia Plan: General   Post-op Pain Management:    Induction: Intravenous  PONV Risk Score and Plan: 3 and Ondansetron, Dexamethasone and Scopolamine patch - Pre-op  Airway Management Planned: Oral ETT  Additional Equipment:   Intra-op Plan:   Post-operative Plan: Extubation in OR  Informed Consent: I have reviewed the patients History and Physical, chart, labs and discussed the procedure including the risks, benefits and alternatives for the proposed anesthesia with the patient or authorized representative who has indicated his/her understanding and acceptance.   Dental advisory given  Plan Discussed with: CRNA and Surgeon  Anesthesia Plan Comments: (Plan routine monitors, GETA)        Anesthesia Quick  Evaluation

## 2018-06-07 NOTE — H&P (Signed)
BP 128/82   Pulse 95   Temp 98.3 F (36.8 C) (Oral)   Resp 18   Ht 5\' 6"  (1.676 m)   Wt 82.6 kg (182 lb)   SpO2 97%   BMI 29.38 kg/m     Allergies  Allergen Reactions  . Cefaclor Diarrhea and Rash    hives  . Sertraline     Suicidal thoughts, insomnia, nightmares  . Amoxicillin-Pot Clavulanate Diarrhea    Has patient had a PCN reaction causing immediate rash, facial/tongue/throat swelling, SOB or lightheadedness with hypotension: No Has patient had a PCN reaction causing severe rash involving mucus membranes or skin necrosis: No Has patient had a PCN reaction that required hospitalization: No Has patient had a PCN reaction occurring within the last 10 years: Yes If all of the above answers are "NO", then may proceed with Cephalosporin use.   . Codeine Nausea And Vomiting    If pt takes a lot of this med  . Latex Itching and Swelling    redness  . Levaquin [Levofloxacin] Swelling  . Nsaids     Bleeding ulcers   Past Medical History:  Diagnosis Date  . Anxiety    panic attack  . Arthritis   . Complication of anesthesia    anesthesia for neck surgery - had confusion/forgetfulness  . COPD (chronic obstructive pulmonary disease) (HCC)   . Dyspnea    with exertion  . GERD (gastroesophageal reflux disease)   . History of bleeding ulcers 1994  . History of low potassium   . Pneumonia 2013   Past Surgical History:  Procedure Laterality Date  . ABDOMINAL HYSTERECTOMY    . ANTERIOR CERVICAL DECOMP/DISCECTOMY FUSION N/A 05/18/2017   Procedure: ACDF - C6-C7;  Surgeon: Coletta Memosabbell, Kambryn Dapolito, MD;  Location: [facility] OR;  Service: Neurosurgery;  Laterality: N/A;  . BACK SURGERY     L 6 Fusion  . ESOPHAGOGASTRODUODENOSCOPY N/A 07/14/2017   Procedure: ESOPHAGOGASTRODUODENOSCOPY (EGD);  Surgeon: Meryl DareStark, Malcolm T, MD;  [facility];  Service: Endoscopy;  Laterality: N/A;  . TONSILLECTOMY    . UPPER GASTROINTESTINAL ENDOSCOPY  1994   Family History  Problem Relation Age of Onset  .  Hypertension Mother   . COPD Mother   . Ulcers Father   . Heart disease Father   . Heart attack Paternal Uncle   . Heart attack Paternal Grandfather    Social History   Socioeconomic History  . Marital status: Divorced    Spouse name: Not on file  . Number of children: Not on file  . Years of education: Not on file  . Highest education level: Not on file  Occupational History  . Not on file  Social Needs  . Financial resource strain: Not on file  . Food insecurity:    Worry: Not on file    Inability: Not on file  . Transportation needs:    Medical: Not on file    Non-medical: Not on file  Tobacco Use  . Smoking status: Current Every Day Smoker    Packs/day: 0.50    Years: 31.00    Pack years: 15.50  . Smokeless tobacco: Never Used  Substance and Sexual Activity  . Alcohol use: No  . Drug use: No  . Sexual activity: Not on file  Lifestyle  . Physical activity:    Days per week: Not on file    Minutes per session: Not on file  . Stress: Not on file  Relationships  . Social connections:  Talks on phone: Not on file    Gets together: Not on file    Attends religious service: Not on file    Active member of club or organization: Not on file    Attends meetings of clubs or organizations: Not on file    Relationship status: Not on file  . Intimate partner violence:    Fear of current or ex partner: Not on file    Emotionally abused: Not on file    Physically abused: Not on file    Forced sexual activity: Not on file  Other Topics Concern  . Not on file  Social History Narrative  . Not on file    Debra [name] underwent an MRI today of the lumbar spine.  What it shows is significant facet arthropathy at 4-5 but no foraminal narrowing there and at 5-1 which is the level above her previous fusion.  She has foraminal narrowing and lateral recess stenosis on the left side.  She has a disc on the right side.  She has significant facet hypertrophy and arthropathy on the  left.  I think this is the most likely reason for the pain that she experiences in the left lower extremity.  Rest of the disc spaces look pretty good.  L4-5 is changing and is not changing in a good way, it is deteriorating, and a fusion at 5-1 may hasten that, but it is not causing a problem as of today.  I had a 25-minute conversation with Debra [name] about the MRI and the findings on it.  She has a markedly arthropathic joint on the left at L5-S1, which is causing stenosis.  Foraminal narrowing.  There is also a disc, and that is eccentric to the right side.  The level where she was fused, S1-S2, is fused, and I do not necessarily know if she got any better because I do not necessarily know that that was causing her problem.  The neurosurgery rationale for that operation was delineated by doctor, and he felt that there was pseudo arthritic type joint seen the sacral ala and transverse process of L6 as the problem.  I mentioned doing an injection.  She is not necessarily desirous of that as she has had multiple injections without relief.  She is alert, oriented x4, answering all questions appropriately. Has normal strength in the upper and lower extremities.  Speech is clear. It is also fluent.  She is going to think about these things.  I gave her the sheet with regard to lumbar fusions and then give [place] a call.

## 2018-06-07 NOTE — Op Note (Signed)
06/07/2018  7:06 PM  PATIENT:  Debra FallenMichelle Holub  52 y.o. female  PRE-OPERATIVE DIAGNOSIS:  Osteoarthritis of spine with radiculopathy, Lumbar 5/S1  POST-OPERATIVE DIAGNOSIS:  Osteoarthritis of spine with radiculopathy, Lumbar5/S1 PROCEDURE:  Procedure(s):lumbar laminectomy beyond the needed exposure for a plif, bilaterally Lumbar Five Sacral One Posterior lumbar interbody fusion, synthes peek cages filled with autograft, disc space filled with autograft Non segmental pedicle screw fixation L5/S1, Nuvasive   SURGEON:  Surgeon(s): Coletta Memosabbell, Glenda Spelman, MD Donalee Citrinram, Gary, MD  ASSISTANTS:Cram, Jillyn HiddenGary  ANESTHESIA:   general  EBL:  No intake/output data recorded.  BLOOD ADMINISTERED:none  CELL SAVER GIVEN:none  COUNT:per nursing  DRAINS: none   SPECIMEN:  No Specimen  DICTATION: Debra [name] is a 52 y.o. female whom was taken to the operating room intubated, and placed under a general anesthetic without difficulty. A foley catheter was placed under sterile conditions. She was positioned prone on a Jackson stable with all pressure points properly padded.  Her lumbar region was prepped and draped in a sterile manner. I infiltrated 10cc's 1/2%lidocaine/1:2000,000 strength epinephrine into the planned incision. I opened the skin with a 10 blade and took the incision down to the thoracolumbar fascia. I exposed the lamina of L4,5, and S1 in a subperiosteal fashion bilaterally. I confirmed my location with an intraoperative xray.  I placed self retaining retractors and started the decompression.  I decompressed the spinal canal via hemilaminectomies bilaterally with bilateral inferior facetectomies of L5, and partial facetectomies of the superior facets of S1 bilaterally. This allowed for complete decompression of the L5 roots, and the spinal canal bilaterally. Foraminotomies were also performed at S1, to decompress the S1 nerve roots.  PLIF's were performed at L5/S1 in the same fashion. I opened the disc  space with a 15 blade then used a variety of instruments to remove the disc and prepare the space for the arthrodesis. I used curettes, rongeurs, punches, shavers for the disc space, and rasps in the discetomy. I measured the disc space and placed 11mm  Peek cages(synthes, opal) into the disc space(s).   I placed pedicle screws at L5 and S1, using fluoroscopic guidance. I drilled a pilot hole, then cannulated the pedicle with a probe at each site. I then tapped each pedicle, assessing each site for pedicle violations. No cutouts were appreciated. Screws [place] Diones(Nuvasive) were then placed at each site without difficulty. We attached rods and locking caps with the appropriate tools. The locking caps were secured with torque limited screwdrivers. Final films were performed and the final construct appeared to be in good position.  We closed the wound in a layered fashion. We approximated the thoracolumbar fascia, subcutaneous, and subcuticular planes with vicryl sutures. I used dermabond and an occlusive bandage for a sterile dressing.     PLAN OF CARE: Admit to inpatient   PATIENT DISPOSITION:  PACU - hemodynamically stable.   Delay start of Pharmacological VTE agent (>24hrs) due to surgical blood loss or risk of bleeding:  yes

## 2018-06-07 NOTE — Anesthesia Procedure Notes (Signed)
Procedure Name: Intubation Date/Time: 06/07/2018 2:00 PM Performed by: Oletta Lamas, CRNA Pre-anesthesia Checklist: Patient identified, Emergency Drugs available, Suction available and Patient being monitored Patient Re-evaluated:Patient Re-evaluated prior to induction Oxygen Delivery Method: Circle System Utilized Preoxygenation: Pre-oxygenation with 100% oxygen Induction Type: IV induction Ventilation: Mask ventilation without difficulty Laryngoscope Size: Mac and 3 Grade View: Grade I Tube type: Oral Tube size: 7.0 mm Number of attempts: 1 Airway Equipment and Method: Stylet Placement Confirmation: ETT inserted through vocal cords under direct vision,  positive ETCO2 and breath sounds checked- equal and bilateral Secured at: 22 cm Tube secured with: Tape Dental Injury: Teeth and Oropharynx as per pre-operative assessment

## 2018-06-08 NOTE — Evaluation (Signed)
Physical Therapy Evaluation Patient Details Name: Debra FallenMichelle Elza MRN: [id number] DOB: 02/09/1966 Today's Date: 06/08/2018   History of Present Illness  Patient is a 52 y/o female admitted for PLIF L5-S1.  PMH positive for arthritis, GERD, COPD, anxiety and previous back and neck surgery.  Clinical Impression  Patient presents with decreased independence with mobility due to pain, decreased LE strength, decreased knowledge of precautions and postural deficits.  She will benefit from skilled PT in the acute setting to allow d/c home with intermittent family support.  She will benefit from follow up HHPT if able to qualify for charity assistance to progress mobility and educate on activity progression in her home environment due to living alone and eager to start exercising when able.     Follow Up Recommendations Home health PT;Supervision - Intermittent    Equipment Recommendations  3in1 (PT)    Recommendations for Other Services       Precautions / Restrictions Precautions Precautions: Back Precaution Booklet Issued: Yes (comment) Required Braces or Orthoses: Spinal Brace Spinal Brace: Lumbar corset;Applied in sitting position Restrictions Weight Bearing Restrictions: No      Mobility  Bed Mobility Overal bed mobility: Needs Assistance Bed Mobility: Rolling;Sidelying to Sit Rolling: Supervision Sidelying to sit: Supervision;HOB elevated       General bed mobility comments: cues for technique and use of rail  Transfers Overall transfer level: Needs assistance Equipment used: Rolling walker (2 wheeled) Transfers: Sit to/from Stand Sit to Stand: Min guard         General transfer comment: for safety, pulls up on walker, cues for proper technique  Ambulation/Gait Ambulation/Gait assistance: Supervision;Min guard Gait Distance (Feet): 200 Feet Assistive device: Rolling walker (2 wheeled) Gait Pattern/deviations: Step-through pattern;Decreased stride length;Trunk  flexed     General Gait Details: cues for posture, and walker use   Stairs            Wheelchair Mobility    Modified Rankin (Stroke Patients Only)       Balance Overall balance assessment: Mild deficits observed, not formally tested                                           Pertinent Vitals/Pain Pain Assessment: 0-10 Pain Score: 7  Pain Location: surgical pain Pain Descriptors / Indicators: Sore;Operative site guarding Pain Intervention(s): Monitored during session    Home Living Family/patient expects to be discharged to:: Private residence Living Arrangements: Alone Available Help at Discharge: Available PRN/intermittently(son and mom to check in) Type of Home: House Home Access: Stairs to enter Entrance Stairs-Rails: Doctor, general practiceight;Left Entrance Stairs-Number of Steps: 3 Home Layout: One level Home Equipment: Cane - single point      Prior Function Level of Independence: Independent         Comments: on disability; drives and cooks and cleans normally, Occupational psychologistyardwork     Hand Dominance   Dominant Hand: Right    Extremity/Trunk Assessment   Upper Extremity Assessment Upper Extremity Assessment: Overall WFL for tasks assessed    Lower Extremity Assessment Lower Extremity Assessment: Generalized weakness       Communication   Communication: No difficulties  Cognition Arousal/Alertness: Awake/alert Behavior During Therapy: WFL for tasks assessed/performed Overall Cognitive Status: Within Functional Limits for tasks assessed  General Comments      Exercises     Assessment/Plan    PT Assessment Patient needs continued PT services  PT Problem List Decreased strength;Decreased mobility;Decreased balance;Decreased knowledge of use of DME;Pain;Decreased knowledge of precautions;Decreased safety awareness       PT Treatment Interventions DME instruction;Therapeutic activities;Gait  training;Therapeutic exercise;Patient/family education;Balance training;Stair training;Functional mobility training    PT Goals (Current goals can be found in the Care Plan section)  Acute Rehab PT Goals Patient Stated Goal: to get more active, lose weight PT Goal Formulation: With patient/family Time For Goal Achievement: 06/12/18 Potential to Achieve Goals: Good    Frequency Min 5X/week   Barriers to discharge        Co-evaluation               AM-PAC PT "6 Clicks" Daily Activity  Outcome Measure Difficulty turning over in bed (including adjusting bedclothes, sheets and blankets)?: A Little Difficulty moving from lying on back to sitting on the side of the bed? : A Lot Difficulty sitting down on and standing up from a chair with arms (e.g., wheelchair, bedside commode, etc,.)?: A Lot Help needed moving to and from a bed to chair (including a wheelchair)?: A Little Help needed walking in hospital room?: A Little Help needed climbing 3-5 steps with a railing? : A Little 6 Click Score: 16    End of Session Equipment Utilized During Treatment: Back brace Activity Tolerance: Patient tolerated treatment well Patient left: in chair;with call bell/phone within reach;with family/visitor present   PT Visit Diagnosis: Other abnormalities of gait and mobility (R26.89)    Time: 1610-9604 PT Time Calculation (min) (ACUTE ONLY): 29 min   Charges:   PT Evaluation $PT Eval Moderate Complexity: 1 Mod PT Treatments $Gait Training: 8-22 mins   PT G CodesSheran Lawless, [place] [phone] 06/08/2018   [name] 06/08/2018, 11:42 AM

## 2018-06-08 NOTE — Progress Notes (Signed)
Patient ID: Debra [name], female   DOB: 08/04/1966, 52 y.o.   MRN: [id number] BP 114/70 (BP Location: Right Arm)   Pulse 97   Temp 98.2 F (36.8 C) (Oral)   Resp 16   Ht 5\' 6"  (1.676 m)   Wt 82.6 kg (182 lb)   SpO2 91%   BMI 29.38 kg/m  Alert and oriented x 4, speech is clear and fluent Moving all extremities well Wound is clean, dry, and without signs of infection Doing well

## 2018-06-08 NOTE — Evaluation (Addendum)
Occupational Therapy Evaluation Patient Details Name: Debra [name] MRN: [id number] DOB: 02/28/1966 Today's Date: 06/08/2018    History of Present Illness Patient is a 52 y/o female admitted for PLIF L5-S1.  PMH positive for arthritis, GERD, COPD, anxiety and previous back and neck surgery.   Clinical Impression   This 52 yo female admitted and underwent above presents to acute OT with back precautions and decreased mobility all affecting her safety and independence with basic ADLs. She will benefit from acute OT with follow up HHOT.     Follow Up Recommendations  Home health OT;Supervision/Assistance - 24 hour    Equipment Recommendations  None recommended by OT       Precautions / Restrictions Precautions Precautions: Back Precaution Booklet Issued: Yes (comment) Precaution Comments: monitor O2 sats  Required Braces or Orthoses: Spinal Brace Spinal Brace: Lumbar corset;Applied in sitting position Restrictions Weight Bearing Restrictions: No      Mobility Bed Mobility Overal bed mobility: Needs Assistance Bed Mobility: Rolling;Sidelying to Sit Rolling: Supervision Sidelying to sit: Supervision;HOB elevated       General bed mobility comments: cues for technique and use of rail; pt does not want hand of bed down with in and OOB  Transfers Overall transfer level: Needs assistance Equipment used: Rolling walker (2 wheeled) Transfers: Sit to/from Stand                Balance Overall balance assessment: Mild deficits observed, not formally tested                                         ADL either performed or assessed with clinical judgement   ADL Overall ADL's : Needs assistance/impaired Eating/Feeding: Independent;Sitting   Grooming: Min guard;Standing   Upper Body Bathing: Set up;Sitting   Lower Body Bathing: Minimal assistance Lower Body Bathing Details (indicate cue type and reason): min guard A sit<>stand Upper Body Dressing : Set  up;Sitting   Lower Body Dressing: Minimal assistance Lower Body Dressing Details (indicate cue type and reason): min guard A sit<>stand Toilet Transfer: Minimal assistance;Ambulation   Toileting- Clothing Manipulation and Hygiene: Min guard;Sit to/from stand         General ADL Comments: pt reports she will not wear socks and only slip on shoes     Vision Patient Visual Report: No change from baseline              Pertinent Vitals/Pain Pain Assessment: 0-10 Pain Score: 4  Pain Location: surgical pain Pain Descriptors / Indicators: Sore;Operative site guarding Pain Intervention(s): Monitored during session     Hand Dominance Right   Extremity/Trunk Assessment Upper Extremity Assessment Upper Extremity Assessment: Overall WFL for tasks assessed           Communication Communication Communication: No difficulties   Cognition Arousal/Alertness: Awake/alert Behavior During Therapy: WFL for tasks assessed/performed Overall Cognitive Status: Within Functional Limits for tasks assessed                                                Home Living Family/patient expects to be discharged to:: Private residence Living Arrangements: Alone Available Help at Discharge: Available PRN/intermittently(son and mom to check in) Type of Home: House Home Access: Stairs to enter [employer] of Steps: 3 Entrance Stairs-Rails:  Right;Left Home Layout: One level     Bathroom Shower/Tub: Tub/shower unit;Curtain   Bathroom Toilet: Handicapped height     Home Equipment: Cane - single point;Shower seat          Prior Functioning/Environment Level of Independence: Independent        Comments: on disability; drives and cooks and cleans normally, yardwork        OT Problem List: Decreased strength;Decreased range of motion;Impaired balance (sitting and/or standing);Pain      OT Treatment/Interventions: Balance training;Self-care/ADL  training;DME and/or AE instruction;Patient/family education    OT Goals(Current goals can be found in the care plan section) Acute Rehab OT Goals Patient Stated Goal: to get more active, lose weight, too find out if I need O2 at home OT Goal Formulation: With patient Time For Goal Achievement: 06/15/18 Potential to Achieve Goals: Good  OT Frequency: Min 3X/week              AM-PAC PT "6 Clicks" Daily Activity     Outcome Measure Help from another person eating meals?: None Help from another person taking care of personal grooming?: A Little Help from another person toileting, which includes using toliet, bedpan, or urinal?: A Little Help from another person bathing (including washing, rinsing, drying)?: A Little Help from another person to put on and taking off regular upper body clothing?: A Little Help from another person to put on and taking off regular lower body clothing?: A Little 6 Click Score: 19   End of Session Equipment Utilized During Treatment: Gait belt;Rolling walker;Oxygen(took O2 due to pt's sats did drop to mid/low 80's with ambulation on RA; however when I had her keep her hand with prop on it still while walking O2 remained 90 or above on RA--but once she sat down dropped into mid 80's with good wave form) Nurse Communication: (RN was monitoring O2 sats on hallway wall unit as well)  Activity Tolerance: Patient tolerated treatment well Patient left: in bed;with call bell/phone within reach;with bed alarm set  OT Visit Diagnosis: Unsteadiness on feet (R26.81);Muscle weakness (generalized) (M62.81);Pain Pain - part of body: (back)                Time: [id number] OT Time Calculation (min): 39 min Charges:  OT General Charges $OT Visit: 1 Visit OT Evaluation $OT Eval Moderate Complexity: 1 Mod OT Treatments $Self Care/Home Management : 23-37 mins Ignacia PalmaCathy Roemello Speyer, OTR/L [phone] 06/08/2018

## 2018-06-09 LAB — BASIC METABOLIC PANEL
Anion gap: 7 (ref 5–15)
BUN: <5 mg/dL — ABNORMAL LOW (ref 6–20)
CALCIUM: 8.5 mg/dL — AB (ref 8.9–10.3)
CHLORIDE: 100 mmol/L (ref 98–111)
CO2: 33 mmol/L — ABNORMAL HIGH (ref 22–32)
CREATININE: 0.62 mg/dL (ref 0.44–1.00)
Glucose, Bld: 107 mg/dL — ABNORMAL HIGH (ref 70–99)
Potassium: 3.2 mmol/L — ABNORMAL LOW (ref 3.5–5.1)
SODIUM: 140 mmol/L (ref 135–145)

## 2018-06-09 NOTE — Care Management Note (Signed)
Case Management Note  Patient Details  Name: Debra [name] MRN: [id number] Date of Birth: 02/06/1966  Subjective/Objective:  Patient is a 52 y/o female admitted for PLIF L5-S1.  PTA, pt independent, lives at home alone.                    Action/Plan: PT/OT recommending HH follow up, DME.  [facility], RW, and 3 in 1.  DME to be delivered to pt's room prior to dc. Start of care 24-48h post dc date.   Expected Discharge Date:                  Expected Discharge Plan:  Home w Home Health Services  In-House Referral:     Discharge planning Services  CM Consult  Post Acute Care Choice:  Home Health Choice offered to:  Patient  DME Arranged:  3-N-1, Walker rolling DME Agency:  [facility]  [facility]  [facility]  Status of Service:  Completed, signed off  If discussed at Long Length of Stay Meetings, dates discussed:    Additional Comments:  Glennon Macmerson, Thia Olesen M, RN 06/09/2018, 4:04 PM

## 2018-06-09 NOTE — Progress Notes (Signed)
Occupational Therapy Treatment Patient Details Name: Debra [name] MRN: [id number] DOB: January 16, 1966 Today's Date: 06/09/2018    History of present illness Patient is a 52 y/o female admitted for PLIF L5-S1.  PMH positive for arthritis, GERD, COPD, anxiety and previous back and neck surgery.   OT comments  This 52 yo female admitted and underwent above presents to acute OT with making progress at a min guard A level when up on feet and with toileting. She will benefit from acute OT with follow up HHOT.   Follow Up Recommendations  Home health OT;Supervision/Assistance - 24 hour    Equipment Recommendations  None recommended by OT       Precautions / Restrictions Precautions Precautions: Back Precaution Booklet Issued: Yes (comment) Required Braces or Orthoses: Spinal Brace Spinal Brace: Lumbar corset;Applied in sitting position Restrictions Weight Bearing Restrictions: No       Mobility Bed Mobility Overal bed mobility: Needs Assistance Bed Mobility: Rolling;Sidelying to Sit Rolling: Supervision Sidelying to sit: Supervision;HOB elevated          Transfers Overall transfer level: Needs assistance Equipment used: Rolling walker (2 wheeled) Transfers: Sit to/from Stand Sit to Stand: Min guard         General transfer comment: VCs for hand placement    Balance Overall balance assessment: Mild deficits observed, not formally tested                                         ADL either performed or assessed with clinical judgement   ADL Overall ADL's : Needs assistance/impaired                         Toilet Transfer: Min guard;Ambulation;RW   Toileting- Architect and Hygiene: Min guard;Sit to/from stand Toileting - Clothing Manipulation Details (indicate cue type and reason): cues for technique to wipe back peri area with wash cloth--leaning to left not twisting around       General ADL Comments: Pt able to don brace  without cues today     Vision Patient Visual Report: No change from baseline            Cognition Arousal/Alertness: Awake/alert Behavior During Therapy: WFL for tasks assessed/performed Overall Cognitive Status: Within Functional Limits for tasks assessed                                                     Pertinent Vitals/ Pain       Pain Assessment: Faces Faces Pain Scale: Hurts little more Pain Location: bil butt checks Pain Descriptors / Indicators: Sore;Aching Pain Intervention(s): Limited activity within patient's tolerance;Monitored during session         Frequency  Min 3X/week        Progress Toward Goals  OT Goals(current goals can now be found in the care plan section)  Progress towards OT goals: Progressing toward goals     Plan Discharge plan remains appropriate       AM-PAC PT "6 Clicks" Daily Activity     Outcome Measure   Help from another person eating meals?: None Help from another person taking care of personal grooming?: A Little Help from another person toileting, which includes using toliet, bedpan,  or urinal?: A Little Help from another person bathing (including washing, rinsing, drying)?: A Little Help from another person to put on and taking off regular upper body clothing?: A Little Help from another person to put on and taking off regular lower body clothing?: A Little 6 Click Score: 19    End of Session Equipment Utilized During Treatment: Gait belt;Rolling walker  OT Visit Diagnosis: Unsteadiness on feet (R26.81);Muscle weakness (generalized) (M62.81);Pain Pain - part of body: (back)   Activity Tolerance Patient tolerated treatment well   Patient Left (with PT ambulating)   Nurse Communication          Time: 1610-9604: 1618-1642 OT Time Calculation (min): 24 min  Charges: OT General Charges $OT Visit: 1 Visit OT Treatments $Self Care/Home Management : 23-37 mins  Ignacia PalmaCathy Lazaro Isenhower,  OTR/L [phone] 06/09/2018

## 2018-06-09 NOTE — Progress Notes (Signed)
Physical Therapy Treatment Patient Details Name: Debra [name] MRN: [id number] DOB: 04/21/1966 Today's Date: 06/09/2018    History of Present Illness Patient is a 52 y/o female admitted for PLIF L5-S1.  PMH positive for arthritis, GERD, COPD, anxiety and previous back and neck surgery.    PT Comments    Patient with limited progression due to increased pain in buttocks today.  Will need PT to practice stair negotiation prior to d/c home.   Follow Up Recommendations  Home health PT;Supervision - Intermittent     Equipment Recommendations  3in1 (PT)    Recommendations for Other Services       Precautions / Restrictions Precautions Precautions: Back Precaution Booklet Issued: Yes (comment) Precaution Comments: monitor O2 sats  Required Braces or Orthoses: Spinal Brace Spinal Brace: Lumbar corset;Applied in sitting position Restrictions Weight Bearing Restrictions: No    Mobility  Bed Mobility Overal bed mobility: Needs Assistance Bed Mobility: Sit to Sidelying Rolling: Supervision Sidelying to sit: Supervision;HOB elevated     Sit to sidelying: Mod assist General bed mobility comments: assist to lift feet on bed, mod cues for technique  Transfers Overall transfer level: Needs assistance Equipment used: Rolling walker (2 wheeled) Transfers: Sit to/from Stand Sit to Stand: Supervision         General transfer comment: VCs for hand placement  Ambulation/Gait Ambulation/Gait assistance: Supervision;Min guard Gait Distance (Feet): 150 Feet Assistive device: Rolling walker (2 wheeled) Gait Pattern/deviations: Step-through pattern;Decreased stride length     General Gait Details: cues for hip extension, decreased UE elevation; encouragement to walk despite bilat glut pain   Stairs             Wheelchair Mobility    Modified Rankin (Stroke Patients Only)       Balance Overall balance assessment: Needs assistance   Sitting balance-Leahy Scale:  Good       Standing balance-Leahy Scale: Fair                              Cognition Arousal/Alertness: Awake/alert Behavior During Therapy: WFL for tasks assessed/performed Overall Cognitive Status: Within Functional Limits for tasks assessed                                        Exercises      General Comments        Pertinent Vitals/Pain Pain Assessment: Faces Faces Pain Scale: Hurts little more Pain Location: bil butt checks Pain Descriptors / Indicators: Sore;Aching Pain Intervention(s): Monitored during session;Repositioned;Patient requesting pain meds-RN notified    Home Living                      Prior Function            PT Goals (current goals can now be found in the care plan section) Progress towards PT goals: Progressing toward goals    Frequency    Min 5X/week      PT Plan Current plan remains appropriate    Co-evaluation              AM-PAC PT "6 Clicks" Daily Activity  Outcome Measure  Difficulty turning over in bed (including adjusting bedclothes, sheets and blankets)?: A Little Difficulty moving from lying on back to sitting on the side of the bed? : A Lot Difficulty sitting down on and standing  up from a chair with arms (e.g., wheelchair, bedside commode, etc,.)?: A Little Help needed moving to and from a bed to chair (including a wheelchair)?: A Little Help needed walking in hospital room?: A Little Help needed climbing 3-5 steps with a railing? : A Little 6 Click Score: 17    End of Session Equipment Utilized During Treatment: Back brace Activity Tolerance: Patient tolerated treatment well Patient left: in bed;with call bell/phone within reach   PT Visit Diagnosis: Other abnormalities of gait and mobility (R26.89)     Time: 1630-1650 PT Time Calculation (min) (ACUTE ONLY): 20 min  Charges:  $Gait Training: 8-22 mins                    G CodesSheran Lawless,  [name] [phone] 06/09/2018    [name] 06/09/2018, 5:36 PM

## 2018-06-09 NOTE — Progress Notes (Signed)
Patient ID: Azucena FallenMichelle Galloway, female   DOB: 01/24/1966, 52 y.o.   MRN: [id number] BP 96/63 (BP Location: Right Arm)   Pulse (!) 107   Temp 98.9 F (37.2 C) (Oral)   Resp 16   Ht 5\' 6"  (1.676 m)   Wt 82.6 kg (182 lb)   SpO2 96%   BMI 29.38 kg/m  Alert and oriented x 4 Complaining of pain in the buttocks, bilaterally Normal strength in the lower extremities Wound is clean, dry, and no signs of infection.  Continue pt

## 2018-06-09 NOTE — Progress Notes (Signed)
PT Cancellation Note  Patient Details Name: Debra [name] MRN: [id number] DOB: 09/08/1966   Cancelled Treatment:    Reason Eval/Treat Not Completed: Pain limiting ability to participate; patient reports pain in both hips that kept her from resting overnight.  Has been up multiple times to the bathroom and reports more reliance on walker and "dragging her feet".  Would like to wait till this afternoon for therapy.  Will check back later today.   Elray McgregorCynthia Wynn 06/09/2018, 11:31 AM  [name], PT [phone] 06/09/2018

## 2018-06-10 ENCOUNTER — Inpatient Hospital Stay (HOSPITAL_COMMUNITY): Payer: Medicaid Other

## 2018-06-10 NOTE — Progress Notes (Signed)
Occupational Therapy Treatment Patient Details Name: Debra FallenMichelle [name] MRN: [id number] DOB: 05/29/1966 Today's Date: 06/10/2018    History of present illness Patient is a 52 y/o female admitted for PLIF L5-S1.  PMH positive for arthritis, GERD, COPD, anxiety and previous back and neck surgery.   OT comments  Pt. Making gains with skilled OT.  Able to complete bed mobility with amb. To recliner with A/E instruction and return demo once seated.    Follow Up Recommendations  Home health OT;Supervision/Assistance - 24 hour    Equipment Recommendations  None recommended by OT    Recommendations for Other Services      Precautions / Restrictions Precautions Precautions: Back Precaution Comments: monitor O2 sats  Required Braces or Orthoses: Spinal Brace Spinal Brace: Lumbar corset;Applied in sitting position       Mobility Bed Mobility Overal bed mobility: Needs Assistance Bed Mobility: Supine to Sit     Supine to sit: Supervision        Transfers   Equipment used: Rolling walker (2 wheeled) Transfers: Sit to/from UGI CorporationStand;Stand Pivot Transfers Sit to Stand: Supervision Stand pivot transfers: Supervision       General transfer comment: VCs for hand placement    Balance                                           ADL either performed or assessed with clinical judgement   ADL Overall ADL's : Needs assistance/impaired                 Upper Body Dressing : Set up;Sitting Upper Body Dressing Details (indicate cue type and reason): able to don brace without assistance Lower Body Dressing: Minimal assistance;Cueing for sequencing;With adaptive equipment Lower Body Dressing Details (indicate cue type and reason): reviewed use of reacher Toilet Transfer: Min guard;Ambulation;RW Toilet Transfer Details (indicate cue type and reason): simulated eob, amb. to recliner           General ADL Comments: Pt able to don brace without cues today      Vision       Perception     Praxis      Cognition Arousal/Alertness: Awake/alert Behavior During Therapy: WFL for tasks assessed/performed Overall Cognitive Status: Within Functional Limits for tasks assessed                                          Exercises     Shoulder Instructions       General Comments      Pertinent Vitals/ Pain       Pain Assessment: No/denies pain  Home Living                                          Prior Functioning/Environment              Frequency  Min 3X/week        Progress Toward Goals  OT Goals(current goals can now be found in the care plan section)  Progress towards OT goals: Progressing toward goals     Plan Discharge plan remains appropriate    Co-evaluation  AM-PAC PT "6 Clicks" Daily Activity     Outcome Measure   Help from another person eating meals?: None Help from another person taking care of personal grooming?: A Little Help from another person toileting, which includes using toliet, bedpan, or urinal?: A Little Help from another person bathing (including washing, rinsing, drying)?: A Little Help from another person to put on and taking off regular upper body clothing?: A Little Help from another person to put on and taking off regular lower body clothing?: A Little 6 Click Score: 19    End of Session Equipment Utilized During Treatment: Gait belt;Rolling walker;Back brace  OT Visit Diagnosis: Unsteadiness on feet (R26.81);Muscle weakness (generalized) (M62.81);Pain   Activity Tolerance Patient tolerated treatment well   Patient Left in chair;with call bell/phone within reach   Nurse Communication          Time: 1252-1310 OT Time Calculation (min): 18 min  Charges: OT General Charges $OT Visit: 1 Visit OT Treatments $Self Care/Home Management : 8-22 mins  Robet Leu, COTA/L 06/10/2018, 1:22 PM

## 2018-06-10 NOTE — Progress Notes (Signed)
Physical Therapy Treatment Patient Details Name: Debra FallenMichelle Lamarca MRN: [id number] DOB: 12/17/1965 Today's Date: 06/10/2018    History of Present Illness Patient is a 52 y/o female admitted for PLIF L5-S1.  PMH positive for arthritis, GERD, COPD, anxiety and previous back and neck surgery.    PT Comments    Patient is making progress toward PT goals. Pt tolerated increased gait distance and stair training this session. Overall pt requires supervision/min guard for all mobility. Continue to progress as tolerated.    Follow Up Recommendations  Home health PT;Supervision - Intermittent     Equipment Recommendations  3in1 (PT)    Recommendations for Other Services       Precautions / Restrictions Precautions Precautions: Back Precaution Comments: monitor O2 sats; back precautions reviewed with pt Required Braces or Orthoses: Spinal Brace Spinal Brace: Lumbar corset;Applied in sitting position Restrictions Weight Bearing Restrictions: No    Mobility  Bed Mobility Overal bed mobility: Needs Assistance Bed Mobility: Sit to Supine     Supine to sit: Supervision Sit to supine: Supervision   General bed mobility comments: supervision for safety  Transfers Overall transfer level: Needs assistance Equipment used: Rolling walker (2 wheeled) Transfers: Sit to/from Stand Sit to Stand: Supervision Stand pivot transfers: Supervision       General transfer comment: safe hand placement demonstrated  Ambulation/Gait Ambulation/Gait assistance: Supervision;Min guard Gait Distance (Feet): 250 Feet Assistive device: Rolling walker (2 wheeled) Gait Pattern/deviations: Step-through pattern;Decreased stride length Gait velocity: decreased   General Gait Details: cues for posture    Stairs Stairs: Yes Stairs assistance: Min guard Stair Management: One rail Right;Step to pattern;Forwards;Sideways Number of Stairs: 10 General stair comments: pt encouraged to practice 5 steps  however pt kept ascending steps despite cues to turn around and descend; pt able to ascend alternating pattern but cues for step to pattern for safety; pt ascended going forward and descended sideways using bilat UE support; min guard for safety   Wheelchair Mobility    Modified Rankin (Stroke Patients Only)       Balance Overall balance assessment: Needs assistance   Sitting balance-Leahy Scale: Good       Standing balance-Leahy Scale: Fair                              Cognition Arousal/Alertness: Awake/alert Behavior During Therapy: WFL for tasks assessed/performed Overall Cognitive Status: Impaired/Different from baseline Area of Impairment: Safety/judgement;Following commands                       Following Commands: Follows multi-step commands with increased time Safety/Judgement: Decreased awareness of deficits;Decreased awareness of safety            Exercises      General Comments        Pertinent Vitals/Pain Pain Assessment: Faces Faces Pain Scale: Hurts a little bit Pain Location: glutes with mobility Pain Descriptors / Indicators: Sore Pain Intervention(s): Monitored during session;Limited activity within patient's tolerance;Repositioned;Premedicated before session;Patient requesting pain meds-RN notified    Home Living                      Prior Function            PT Goals (current goals can now be found in the care plan section) Acute Rehab PT Goals PT Goal Formulation: With patient/family Time For Goal Achievement: 06/12/18 Potential to Achieve Goals: Good Progress towards PT goals:  Progressing toward goals    Frequency    Min 5X/week      PT Plan Current plan remains appropriate    Co-evaluation              AM-PAC PT "6 Clicks" Daily Activity  Outcome Measure  Difficulty turning over in bed (including adjusting bedclothes, sheets and blankets)?: A Little Difficulty moving from lying on  back to sitting on the side of the bed? : A Lot Difficulty sitting down on and standing up from a chair with arms (e.g., wheelchair, bedside commode, etc,.)?: A Little Help needed moving to and from a bed to chair (including a wheelchair)?: A Little Help needed walking in hospital room?: A Little Help needed climbing 3-5 steps with a railing? : A Little 6 Click Score: 17    End of Session Equipment Utilized During Treatment: Back brace;Gait belt Activity Tolerance: Patient tolerated treatment well Patient left: in bed;with call bell/phone within reach Nurse Communication: Mobility status PT Visit Diagnosis: Other abnormalities of gait and mobility (R26.89)     Time: 8469-6295 PT Time Calculation (min) (ACUTE ONLY): 30 min  Charges:  $Gait Training: 8-22 mins $Therapeutic Activity: 8-22 mins                    G Codes:       Erline Levine, PTA Pager: [phone]     Carolynne Edouard 06/10/2018, 3:13 PM

## 2018-06-10 NOTE — Progress Notes (Signed)
Patient ID: Debra [name], female   DOB: 08/04/1966, 52 y.o.   MRN: [id number] BP 107/72 (BP Location: Right Arm)   Pulse (!) 108   Temp 99.8 F (37.7 C) (Oral)   Resp 18   Ht 5\' 6"  (1.676 m)   Wt 82.6 kg (182 lb)   SpO2 98%   BMI 29.38 kg/m  Alert, oriented Full strength  xray today shows all hardware to be in good position Believe pain is postop, and that it will subside with time.

## 2018-06-10 NOTE — Progress Notes (Signed)
Pt complaining of a new onset of numbness and tingling in both buttocks that radiates down both legs. Pt stated this began on the night of 06/08/18 and MD is aware.

## 2018-06-10 NOTE — Progress Notes (Signed)
OT Cancellation Note  Patient Details Name: Debra [name] MRN: [id number] DOB: 04/27/1966   Cancelled Treatment:    Reason Eval/Treat Not Completed: Patient at procedure or test/ unavailable. Pt. Leaving for xray.  Will check back later today as time permits.    Robet LeuMorris, Debra [name] [place], COTA/L 06/10/2018, 11:40 AM

## 2018-06-11 LAB — TYPE AND SCREEN
ABO/RH(D): O POS
ANTIBODY SCREEN: POSITIVE
Unit division: 0
Unit division: 0

## 2018-06-11 LAB — BPAM RBC
BLOOD PRODUCT EXPIRATION DATE: 201908102359
Blood Product Expiration Date: 201908102359
Unit Type and Rh: 5100
Unit Type and Rh: 5100

## 2018-06-11 MED ORDER — DIAZEPAM 5 MG PO TABS: 5.0000 mg | ORAL_TABLET | Freq: Four times a day (QID) | ORAL | 0 refills | Status: DC | PRN

## 2018-06-11 MED ORDER — OXYCODONE HCL 5 MG PO TABS: 5.0000 mg | ORAL_TABLET | Freq: Four times a day (QID) | ORAL | 0 refills | Status: AC | PRN

## 2018-06-11 NOTE — Discharge Summary (Signed)
Physician Discharge Summary  Patient ID: Debra FallenMichelle [name] MRN: [id number] DOB/AGE: 52/01/1966 52 y.o.  Admit date: 06/07/2018 Discharge date: 06/11/2018  Admission Diagnoses:spondylolisthesis lumbar 5/S1  Discharge Diagnoses: same Active Problems:   Spondylolisthesis of lumbar region   Discharged Condition: fair  Hospital Course: Debra [name] was admitted and taken to the operating room for an uncomplicated lumbar decompression and fusion. Post op she has had respiratory problems,  Specifically low oxygenation in the 80's on room air. She does have a pulmonologist which she will contact on discharge. Her wound is clean, dry, and without signs of infection. She is ambulating, and voiding. She will be prescribed home health pt and ot  Treatments: surgery: Procedure(s):lumbar laminectomy beyond the needed exposure for a plif, bilaterally Lumbar Five Sacral One Posterior lumbar interbody fusion, synthes peek cages filled with autograft, disc space filled with autograft Non segmental pedicle screw fixation L5/S1, Nuvasive     Discharge Exam: Blood pressure 106/68, pulse (!) 113, temperature 99.1 F (37.3 C), temperature source Oral, resp. rate 18, height 5\' 6"  (1.676 m), weight 82.6 kg (182 lb), SpO2 94 %. General appearance: alert, cooperative, appears stated age and mild distress Neurologic: Alert and oriented X 3, normal strength and tone. Normal symmetric reflexes. Normal coordination and gait  Disposition: Discharge disposition: 01-Home or Self Care      Osteoarthritis of spine with radiculopathy, Lumbar  Allergies as of 06/11/2018      Reactions   Cefaclor Diarrhea, Rash   hives   Sertraline    Suicidal thoughts, insomnia, nightmares   Amoxicillin-pot Clavulanate Diarrhea   Has patient had a PCN reaction causing immediate rash, facial/tongue/throat swelling, SOB or lightheadedness with hypotension: No Has patient had a PCN reaction causing severe rash involving mucus  membranes or skin necrosis: No Has patient had a PCN reaction that required hospitalization: No Has patient had a PCN reaction occurring within the last 10 years: Yes If all of the above answers are "NO", then may proceed with Cephalosporin use.   Codeine Nausea And Vomiting   If pt takes a lot of this med   Latex Itching, Swelling   redness   Levaquin [levofloxacin] Swelling   Nsaids    Bleeding ulcers      Medication List    STOP taking these medications   oxyCODONE-acetaminophen 7.5-325 MG tablet Commonly known as:  PERCOCET   traMADol 50 MG tablet Commonly known as:  ULTRAM     TAKE these medications   citalopram 40 MG tablet Commonly known as:  CELEXA Take 40 mg by mouth every evening.   cyclobenzaprine 10 MG tablet Commonly known as:  FLEXERIL Take 10 mg by mouth every 8 (eight) hours as needed.   diazepam 5 MG tablet Commonly known as:  VALIUM Take 1 tablet (5 mg total) by mouth at bedtime as needed for anxiety. What changed:  when to take this   diazepam 5 MG tablet Commonly known as:  VALIUM Take 1 tablet (5 mg total) by mouth every 6 (six) hours as needed for anxiety. What changed:  You were already taking a medication with the same name, and this prescription was added. Make sure you understand how and when to take each.   DULERA 200-5 MCG/ACT Aero Generic drug:  mometasone-formoterol Take 2 puffs by mouth 2 (two) times daily.   lidocaine 5 % Commonly known as:  LIDODERM Place 1 patch onto the skin as needed for pain.   omeprazole 20 MG capsule Commonly known as:  PRILOSEC  Take 20 mg by mouth daily.   ondansetron 4 MG tablet Commonly known as:  ZOFRAN Take 1 tablet (4 mg total) by mouth every 8 (eight) hours as needed for nausea or vomiting.   oxyCODONE 5 MG immediate release tablet Commonly known as:  ROXICODONE Take 1-2 tablets (5-10 mg total) by mouth every 6 (six) hours as needed for up to 7 days for severe pain.   pantoprazole 40 MG  tablet Commonly known as:  PROTONIX Take 1 tablet (40 mg total) by mouth daily.   potassium chloride SA 20 MEQ tablet Commonly known as:  K-DUR,KLOR-CON Take 1 tablet (20 mEq total) by mouth daily. What changed:  how much to take   pregabalin 100 MG capsule Commonly known as:  LYRICA Take 100 mg by mouth 2 (two) times daily.   PROAIR HFA 108 (90 Base) MCG/ACT inhaler Generic drug:  albuterol Inhale 2 puffs into the lungs every 6 (six) hours as needed.   promethazine 25 MG tablet Commonly known as:  PHENERGAN Take 1 tablet (25 mg total) by mouth every 6 (six) hours as needed for nausea or vomiting.   SPIRIVA HANDIHALER 18 MCG inhalation capsule Generic drug:  tiotropium Place 1 capsule into inhaler and inhale daily.            Durable Medical Equipment  (From admission, onward)        Start     Ordered   06/11/18 1015  For home use only DME Bedside commode  Once    Question:  Patient needs a bedside commode to treat with the following condition  Answer:  Lumbar stenosis with neurogenic claudication   06/11/18 1016   06/09/18 1559  For home use only DME Walker rolling  Once    Question:  Patient needs a walker to treat with the following condition  Answer:  Spondylosis of lumbar spine   06/09/18 1601   06/09/18 1559  For home use only DME Bedside commode  Once    Question:  Patient needs a bedside commode to treat with the following condition  Answer:  Spondylosis of lumbar spine   06/09/18 1601     Follow-up Information    [facility].   Specialty:  Home Health Services Why:  Home health physical and occupational therapy to follow up with you at home.  Contact information: [address] [place] [address] [phone]        Coletta Memos, MD Follow up in 3 week(s).   Specialty:  Neurosurgery Why:  please call the office to make an appointment Contact information: [address] [place]  [address] [phone]           Signed: Carmela Hurt 06/11/2018, 10:17 AM

## 2018-06-11 NOTE — Discharge Instructions (Signed)

## 2018-08-22 ENCOUNTER — Other Ambulatory Visit (HOSPITAL_COMMUNITY): Payer: Self-pay | Admitting: Respiratory Therapy

## 2018-08-22 DIAGNOSIS — J441 Chronic obstructive pulmonary disease with (acute) exacerbation: Secondary | ICD-10-CM

## 2018-08-30 ENCOUNTER — Ambulatory Visit (HOSPITAL_COMMUNITY)
Admission: RE | Admit: 2018-08-30 | Discharge: 2018-08-30 | Disposition: A | Discharge status: Home / Self Care | Payer: Medicaid Other | Source: Ambulatory Visit | Attending: Pulmonary Disease | Admitting: Pulmonary Disease

## 2018-08-30 ENCOUNTER — Other Ambulatory Visit (HOSPITAL_COMMUNITY): Payer: Self-pay | Admitting: Pulmonary Disease

## 2018-08-30 ENCOUNTER — Other Ambulatory Visit (HOSPITAL_COMMUNITY)
Admission: RE | Admit: 2018-08-30 | Discharge: 2018-08-30 | Disposition: A | Discharge status: Home / Self Care | Payer: Medicaid Other | Source: Ambulatory Visit | Attending: Pulmonary Disease | Admitting: Pulmonary Disease

## 2018-08-30 DIAGNOSIS — Z79899 Other long term (current) drug therapy: Secondary | ICD-10-CM | POA: Insufficient documentation

## 2018-08-30 DIAGNOSIS — J441 Chronic obstructive pulmonary disease with (acute) exacerbation: Secondary | ICD-10-CM | POA: Diagnosis not present

## 2018-08-30 DIAGNOSIS — F17210 Nicotine dependence, cigarettes, uncomplicated: Secondary | ICD-10-CM | POA: Diagnosis not present

## 2018-08-30 DIAGNOSIS — R942 Abnormal results of pulmonary function studies: Secondary | ICD-10-CM | POA: Insufficient documentation

## 2018-08-30 DIAGNOSIS — J42 Unspecified chronic bronchitis: Secondary | ICD-10-CM

## 2018-08-30 LAB — PULMONARY FUNCTION TEST
DL/VA % pred: 42 %
DL/VA: 2.13 ml/min/mmHg/L
DLCO UNC: 11.47 ml/min/mmHg
DLCO unc % pred: 42 %
FEF 25-75 PRE: 0.4 L/s
FEF 25-75 Post: 0.41 L/sec
FEF2575-%Change-Post: 2 %
FEF2575-%Pred-Post: 14 %
FEF2575-%Pred-Pre: 14 %
FEV1-%Change-Post: 0 %
FEV1-%PRED-POST: 49 %
FEV1-%Pred-Pre: 49 %
FEV1-Post: 1.47 L
FEV1-Pre: 1.45 L
FEV1FVC-%Change-Post: 7 %
FEV1FVC-%Pred-Pre: 57 %
FEV6-%CHANGE-POST: 1 %
FEV6-%PRED-POST: 72 %
FEV6-%Pred-Pre: 71 %
FEV6-Post: 2.64 L
FEV6-Pre: 2.6 L
FEV6FVC-%CHANGE-POST: 7 %
FEV6FVC-%Pred-Post: 91 %
FEV6FVC-%Pred-Pre: 84 %
FVC-%Change-Post: -5 %
FVC-%Pred-Post: 79 %
FVC-%Pred-Pre: 84 %
FVC-Post: 2.98 L
FVC-Pre: 3.17 L
Post FEV1/FVC ratio: 49 %
Post FEV6/FVC ratio: 89 %
Pre FEV1/FVC ratio: 46 %
Pre FEV6/FVC Ratio: 82 %
RV % pred: 210 %
RV: 4.09 L
TLC % pred: 132 %
TLC: 7.08 L

## 2018-08-30 MED ORDER — ALBUTEROL SULFATE (2.5 MG/3ML) 0.083% IN NEBU
2.5000 mg | INHALATION_SOLUTION | Freq: Once | RESPIRATORY_TRACT | Status: AC
  Administered 2018-08-30: 2.5 mg via RESPIRATORY_TRACT

## 2018-08-31 LAB — ALPHA-1 ANTITRYPSIN PHENOTYPE: A-1 Antitrypsin, Ser: 155 mg/dL (ref 90–200)

## 2018-10-30 ENCOUNTER — Encounter: Payer: Self-pay | Admitting: Internal Medicine

## 2018-11-14 ENCOUNTER — Encounter: Payer: Self-pay | Admitting: Gastroenterology

## 2018-11-14 ENCOUNTER — Telehealth: Payer: Self-pay | Admitting: Gastroenterology

## 2018-11-14 NOTE — Telephone Encounter (Signed)
Pt has issues with transportation  Issues and she doesn't want to have a PV before the Christmas holiday - she wants to cancel her 12-23 PV and her 1-8 colon and CB at a later date and RS-  I cancelled the PV and colon - instructed pt to call when she is ready to [employer]   [facility] PV

## 2018-11-14 NOTE — Telephone Encounter (Signed)
Pt want to speak to nurse regarding pre visit

## 2018-12-05 ENCOUNTER — Encounter: Payer: Medicaid Other | Admitting: Internal Medicine

## 2018-12-06 ENCOUNTER — Encounter: Payer: Medicaid Other | Admitting: Gastroenterology

## 2019-01-09 ENCOUNTER — Emergency Department (HOSPITAL_COMMUNITY)
Admission: EM | Admit: 2019-01-09 | Discharge: 2019-01-09 | Disposition: A | Discharge status: Home / Self Care | Payer: Medicaid Other | Attending: Emergency Medicine | Admitting: Emergency Medicine

## 2019-01-09 ENCOUNTER — Encounter (HOSPITAL_COMMUNITY): Payer: Self-pay | Admitting: Emergency Medicine

## 2019-01-09 DIAGNOSIS — Z4802 Encounter for removal of sutures: Secondary | ICD-10-CM

## 2019-01-09 NOTE — Discharge Instructions (Signed)
These return for any problem.  Follow-up with your regular care providers as instructed.  Please follow-up with orthopedics as instructed for further evaluation of your possible Achilles tendon injury.

## 2019-01-09 NOTE — ED Triage Notes (Signed)
Pt states she was told to come here by her doctor to have her stitches removed from her achillis tendon surgery that was 9 days ago.

## 2019-01-09 NOTE — ED Provider Notes (Addendum)
[facility] EMERGENCY DEPARTMENT Provider Note   CSN: [id number] Arrival date & time: 01/09/19  1529     History   Chief Complaint Chief Complaint  Patient presents with  . Suture / Staple Removal    HPI Debra [name] is a 53 y.o. female.  53 year old female with prior medical history as detailed below presents for evaluation of a wound to the right posterior ankle.  Patient reports that 10 days ago she had an injury in [place].  This was repaired in the ER.  She was told that she had an Achilles tendon laceration.  Her skin was closed with staples.  She presents today requesting removal of the staples.  She is otherwise without complaint.  The history is provided by the patient and medical records.  Suture / Staple Removal  This is a new problem. The current episode started more than 1 week ago. The problem occurs constantly. The problem has not changed since onset.Pertinent negatives include no chest pain and no abdominal pain. Nothing aggravates the symptoms. Nothing relieves the symptoms.    Past Medical History:  Diagnosis Date  . Anxiety    panic attack  . Arthritis    "lower back" (06/07/2018)  . Chronic lower back pain   . Complication of anesthesia    anesthesia for neck surgery - had confusion/forgetfulness  . COPD (chronic obstructive pulmonary disease) (HCC)   . Dyspnea    with exertion  . GERD (gastroesophageal reflux disease)   . History of bleeding ulcers 1994  . History of hiatal hernia   . History of low potassium   . Pneumonia 2013    Patient Active Problem List   Diagnosis Date Noted  . Spondylolisthesis of lumbar region 06/07/2018  . Abnormal CT scan, stomach   . Precordial chest pain   . Nausea 07/12/2017  . Epigastric pain 07/12/2017  . GERD (gastroesophageal reflux disease) 07/12/2017  . Elevated troponin 07/12/2017  . Hypokalemia 07/12/2017  . Anxiety 07/12/2017  . Prolonged QT interval 07/12/2017  . HNP  (herniated nucleus pulposus), cervical 05/18/2017    Past Surgical History:  Procedure Laterality Date  . ANTERIOR CERVICAL DECOMP/DISCECTOMY FUSION N/A 05/18/2017   Procedure: ACDF - C6-C7;  Surgeon: Coletta Memos, MD;  Location: [facility] OR;  Service: Neurosurgery;  Laterality: N/A;  . ANTERIOR LUMBAR FUSION  2013   "L-6"  . BACK SURGERY    . DILATION AND CURETTAGE OF UTERUS  1997  . ESOPHAGOGASTRODUODENOSCOPY N/A 07/14/2017   Procedure: ESOPHAGOGASTRODUODENOSCOPY (EGD);  Surgeon: Meryl Dare, MD;  Location: [facility] ENDOSCOPY;  Service: Endoscopy;  Laterality: N/A;  . POSTERIOR LUMBAR FUSION  06/07/2018   L5-S1  . TONSILLECTOMY    . UPPER GASTROINTESTINAL ENDOSCOPY  1994  . VAGINAL HYSTERECTOMY  1997     OB History   No obstetric history on file.      Home Medications    Prior to Admission medications   Medication Sig Start Date End Date Taking? Authorizing Provider  citalopram (CELEXA) 40 MG tablet Take 40 mg by mouth every evening.    [provider]  cyclobenzaprine (FLEXERIL) 10 MG tablet Take 10 mg by mouth every 8 (eight) hours as needed. 05/21/18   [provider]  diazepam (VALIUM) 5 MG tablet Take 1 tablet (5 mg total) by mouth at bedtime as needed for anxiety. Patient taking differently: Take 5 mg by mouth 3 (three) times daily.  07/14/17   Maxie Barb, MD  diazepam (VALIUM) 5  MG tablet Take 1 tablet (5 mg total) by mouth every 6 (six) hours as needed for anxiety. 06/11/18   Coletta Memosabbell, Kyle, MD  DULERA 200-5 MCG/ACT AERO Take 2 puffs by mouth 2 (two) times daily. 05/03/18   [provider]  lidocaine (LIDODERM) 5 % Place 1 patch onto the skin as needed for pain. 05/26/17   [provider]  omeprazole (PRILOSEC) 20 MG capsule Take 20 mg by mouth daily.    [provider]  ondansetron (ZOFRAN) 4 MG tablet Take 1 tablet (4 mg total) by mouth every 8 (eight) hours as needed for nausea or vomiting. Patient not taking: Reported on  08/31/2017 07/14/17   Maxie BarbBhandari, Dron Prasad, MD  pantoprazole (PROTONIX) 40 MG tablet Take 1 tablet (40 mg total) by mouth daily. Patient not taking: Reported on 08/31/2017 07/15/17   Maxie BarbBhandari, Dron Prasad, MD  potassium chloride SA (K-DUR,KLOR-CON) 20 MEQ tablet Take 1 tablet (20 mEq total) by mouth daily. Patient taking differently: Take 10 mEq by mouth daily.  07/14/17   Maxie BarbBhandari, Dron Prasad, MD  pregabalin (LYRICA) 100 MG capsule Take 100 mg by mouth 2 (two) times daily.     [provider]  PROAIR HFA 108 [phone](90 Base) MCG/ACT inhaler Inhale 2 puffs into the lungs every 6 (six) hours as needed. 04/07/18   [provider]  promethazine (PHENERGAN) 25 MG tablet Take 1 tablet (25 mg total) by mouth every 6 (six) hours as needed for nausea or vomiting. 08/31/17   Fayrene Helperran, Bowie, PA-C  SPIRIVA HANDIHALER 18 MCG inhalation capsule Place 1 capsule into inhaler and inhale daily. 05/24/18   [provider]    Family History Family History  Problem Relation Age of Onset  . Hypertension Mother   . COPD Mother   . Ulcers Father   . Heart disease Father   . Heart attack Paternal Uncle   . Heart attack Paternal Grandfather     Social History Social History   Tobacco Use  . Smoking status: Current Every Day Smoker    Packs/day: 0.50    Years: 35.00    Pack years: 17.50  . Smokeless tobacco: Never Used  Substance Use Topics  . Alcohol use: Yes    Comment: 06/07/2018 "might have a beer q 3-4 months"  . Drug use: Not Currently     Allergies   Cefaclor; Sertraline; Amoxicillin-pot clavulanate; Codeine; Latex; Levaquin [levofloxacin]; and Nsaids   Review of Systems Review of Systems  Cardiovascular: Negative for chest pain.  Gastrointestinal: Negative for abdominal pain.  All other systems reviewed and are negative.    Physical Exam Updated Vital Signs BP 127/86 (BP Location: Right Arm)   Pulse (!) 120   Temp 97.9 F (36.6 C) (Oral)   Resp 16   SpO2 99%    Physical Exam Vitals signs and nursing note reviewed.  Constitutional:      General: She is not in acute distress.    Appearance: She is well-developed.  HENT:     Head: Normocephalic and atraumatic.  Eyes:     Conjunctiva/sclera: Conjunctivae normal.     Pupils: Pupils are equal, round, and reactive to light.  Neck:     Musculoskeletal: Normal range of motion and neck supple.  Cardiovascular:     Rate and Rhythm: Normal rate and regular rhythm.     Heart sounds: Normal heart sounds.  Pulmonary:     Effort: Pulmonary effort is normal. No respiratory distress.     Breath sounds: Normal  breath sounds.  Abdominal:     General: There is no distension.     Palpations: Abdomen is soft.     Tenderness: There is no abdominal tenderness.  Musculoskeletal: Normal range of motion.        General: No deformity.  Skin:    General: Skin is warm and dry.     Comments: Wound to the right posterior ankle is clean dry and intact.  8 Staples are in place.  Neurological:     Mental Status: She is alert and oriented to person, place, and time.      ED Treatments / Results  Labs (all labs ordered are listed, but only abnormal results are displayed) Labs Reviewed - No data to display  EKG None  Radiology No results found.  Procedures .Suture Removal Date/Time: 01/09/2019 5:34 PM Performed by: Wynetta FinesMessick, Taylor Levick C, MD Authorized by: Wynetta FinesMessick, Ashante Yellin C, MD   Consent:    Consent obtained:  Verbal   Consent given by:  Patient   Risks discussed:  Bleeding, wound separation and pain   Alternatives discussed:  No treatment Location:    Location: right posterior ankle  Procedure details:    Wound appearance:  No signs of infection, good wound healing and clean   Number of staples removed:  8 Post-procedure details:    Post-removal:  Dressing applied   Patient tolerance of procedure:  Tolerated well, no immediate complications   (including critical care time)  Medications Ordered in  ED Medications - No data to display   Initial Impression / Assessment and Plan / ED Course  I have reviewed the triage vital signs and the nursing notes.  Pertinent labs & imaging results that were available during my care of the patient were reviewed by me and considered in my medical decision making (see chart for details).    MDM  Screen complete  Patient's reported history is somewhat unusual.  She reports that she had an Achilles tendon laceration that was repaired in the ER by an ER doctor.  It is unclear to me what her actual injury is.  Patient is discussed with Dr. Everardo PacificVarkey who is on-call for orthopedics -- he agrees that having the staples removed from the skin at this time is appropriate.  Portance of close follow-up was stressed.  Strict return precautions given and understood.  Patient is completely understanding the need for close follow-up with orthopedics to determine whether she actually has an underlying Achilles tendon injury.  Final Clinical Impressions(s) / ED Diagnoses   Final diagnoses:  Encounter for staple removal    ED Discharge Orders    None       Wynetta FinesMessick, Mithra Spano C, MD 01/09/19 1734    Wynetta FinesMessick, Mahala Rommel C, MD 01/09/19 1735

## 2019-02-14 ENCOUNTER — Encounter: Payer: Self-pay | Admitting: Emergency Medicine

## 2019-06-20 ENCOUNTER — Institutional Professional Consult (permissible substitution): Payer: Medicaid Other | Admitting: Pulmonary Disease

## 2019-07-09 ENCOUNTER — Institutional Professional Consult (permissible substitution): Payer: Medicaid Other | Admitting: Pulmonary Disease

## 2019-12-12 ENCOUNTER — Ambulatory Visit: Payer: Medicaid Other | Admitting: Pulmonary Disease

## 2019-12-12 ENCOUNTER — Encounter: Payer: Self-pay | Admitting: Pulmonary Disease

## 2019-12-12 ENCOUNTER — Other Ambulatory Visit: Payer: Self-pay

## 2019-12-12 VITALS — BP 120/70 | HR 100 | Temp 97.8°F | Ht 67.0 in | Wt 191.8 lb

## 2019-12-12 DIAGNOSIS — R05 Cough: Secondary | ICD-10-CM

## 2019-12-12 DIAGNOSIS — Z720 Tobacco use: Secondary | ICD-10-CM

## 2019-12-12 DIAGNOSIS — J449 Chronic obstructive pulmonary disease, unspecified: Secondary | ICD-10-CM

## 2019-12-12 DIAGNOSIS — R059 Cough, unspecified: Secondary | ICD-10-CM

## 2019-12-12 DIAGNOSIS — R042 Hemoptysis: Secondary | ICD-10-CM | POA: Diagnosis not present

## 2019-12-12 DIAGNOSIS — F17210 Nicotine dependence, cigarettes, uncomplicated: Secondary | ICD-10-CM

## 2019-12-12 MED ORDER — SPIRIVA RESPIMAT 2.5 MCG/ACT IN AERS: 2.0000 | INHALATION_SPRAY | Freq: Every day | RESPIRATORY_TRACT | 0 refills | Status: DC

## 2019-12-12 MED ORDER — SPIRIVA RESPIMAT 2.5 MCG/ACT IN AERS: 2.0000 | INHALATION_SPRAY | Freq: Every day | RESPIRATORY_TRACT | 3 refills | Status: DC

## 2019-12-12 NOTE — Patient Instructions (Addendum)
Thank you for visiting Dr. Tonia Brooms at [facility] Pulmonary. Today we recommend the following:  Orders Placed This Encounter  Procedures  . CT CHEST WO CONTRAST   Samples of spiriva respimat plus new prescription   Return in about 4 years (around 12/12/2023) for with APP.     Please do your part to reduce the spread of COVID-19.

## 2019-12-12 NOTE — Progress Notes (Signed)
Synopsis: Referred in Jan 2021 for COPD by Ivan Anchors, FNP  Subjective:   PATIENT ID: Debra [name] GENDER: female DOB: [birth year], MRN: [id number]  Chief Complaint  Patient presents with  . Pulmonary Consult    Referral for COPD. Former Dr. Luan Pulling patient. Patient reports sob with exertion, and dry cough.     PMH of chronic hypoxic respiratory failure, was put on O2 about a year ago, at night time 3L at night, dx of COPD. Current smoker, 1 ppd.  Unfortunately the patient is still smoking.  She was discussed potentially switching to vaping.  At baseline she has brown sputum production.  Occasionally has had hemoptysis.  Has not had any recent chest CT imaging.  She uses her inhalers regularly.  She states that she routinely gets thrush and painful mouth.  She is been on several inhalers in the past but the steroid inhalers caused her to have thrush.  She states that powder inhalers irritate her mouth and she tries to avoid them.  She does not like any of those and has been on them in the past.  Her mother and father also have COPD.  Denies history of lung cancer family history of lung cancer.   Past Medical History:  Diagnosis Date  . Anxiety    panic attack  . Arthritis    "lower back" (06/07/2018)  . Chronic lower back pain   . Complication of anesthesia    anesthesia for neck surgery - had confusion/forgetfulness  . COPD (chronic obstructive pulmonary disease) ([place])   . Dyspnea    with exertion  . GERD (gastroesophageal reflux disease)   . History of bleeding ulcers 1994  . History of hiatal hernia   . History of low potassium   . Pneumonia 2013     Family History  Problem Relation Age of Onset  . Hypertension Mother   . COPD Mother   . Ulcers Father   . Heart disease Father   . Heart attack Paternal Uncle   . Heart attack Paternal Grandfather      Past Surgical History:  Procedure Laterality Date  . ANTERIOR CERVICAL DECOMP/DISCECTOMY FUSION N/A 05/18/2017   Procedure: ACDF - C6-C7;  Surgeon: Ashok Pall, MD;  Location: [facility];  Service: Neurosurgery;  Laterality: N/A;  . ANTERIOR LUMBAR FUSION  2013   "L-6"  . BACK SURGERY    . DILATION AND CURETTAGE OF UTERUS  1997  . ESOPHAGOGASTRODUODENOSCOPY N/A 07/14/2017   Procedure: ESOPHAGOGASTRODUODENOSCOPY (EGD);  Surgeon: Ladene Artist, MD;  Location: [facility] ENDOSCOPY;  Service: Endoscopy;  Laterality: N/A;  . POSTERIOR LUMBAR FUSION  06/07/2018   L5-S1  . TONSILLECTOMY    . UPPER GASTROINTESTINAL ENDOSCOPY  1994  . VAGINAL HYSTERECTOMY  1997    Social History   Socioeconomic History  . Marital status: Divorced    Spouse name: Not on file  . Number of children: Not on file  . Years of education: Not on file  . Highest education level: Not on file  Occupational History  . Not on file  Tobacco Use  . Smoking status: Current Every Day Smoker    Packs/day: 1.00    Years: 35.00    Pack years: 35.00  . Smokeless tobacco: Never Used  Substance and Sexual Activity  . Alcohol use: Yes    Comment: 06/07/2018 "might have a beer q 3-4 months"  . Drug use: Not Currently  . Sexual activity: Not Currently  Other Topics Concern  . Not on  file  Social History Narrative  . Not on file   Social Determinants of Health   Financial Resource Strain:   . Difficulty of Paying Living Expenses: Not on file  Food Insecurity:   . Worried About Programme researcher, broadcasting/film/video in the Last Year: Not on file  . Ran Out of Food in the Last Year: Not on file  Transportation Needs:   . Lack of Transportation (Medical): Not on file  . Lack of Transportation (Non-Medical): Not on file  Physical Activity:   . Days of Exercise per Week: Not on file  . Minutes of Exercise per Session: Not on file  Stress:   . Feeling of Stress : Not on file  Social Connections:   . Frequency of Communication with Friends and Family: Not on file  . Frequency of Social Gatherings with Friends and Family: Not on file  . Attends Religious  Services: Not on file  . Active Member of Clubs or Organizations: Not on file  . Attends Banker Meetings: Not on file  . Marital Status: Not on file  Intimate Partner Violence:   . Fear of Current or Ex-Partner: Not on file  . Emotionally Abused: Not on file  . Physically Abused: Not on file  . Sexually Abused: Not on file     Allergies  Allergen Reactions  . Cefaclor Diarrhea and Rash    hives  . Sertraline     Suicidal thoughts, insomnia, nightmares  . Amoxicillin-Pot Clavulanate Diarrhea    Has patient had a PCN reaction causing immediate rash, facial/tongue/throat swelling, SOB or lightheadedness with hypotension: No Has patient had a PCN reaction causing severe rash involving mucus membranes or skin necrosis: No Has patient had a PCN reaction that required hospitalization: No Has patient had a PCN reaction occurring within the last 10 years: Yes If all of the above answers are "NO", then may proceed with Cephalosporin use.   . Codeine Nausea And Vomiting    If pt takes a lot of this med  . Latex Itching and Swelling    redness  . Levaquin [Levofloxacin] Swelling  . Nsaids     Bleeding ulcers     Outpatient Medications Prior to Visit  Medication Sig Dispense Refill  . citalopram (CELEXA) 40 MG tablet Take 40 mg by mouth every evening.    . cyclobenzaprine (FLEXERIL) 10 MG tablet Take 10 mg by mouth every 8 (eight) hours as needed.  2  . diazepam (VALIUM) 5 MG tablet Take 1 tablet (5 mg total) by mouth at bedtime as needed for anxiety. (Patient taking differently: Take 5 mg by mouth 3 (three) times daily. ) 5 tablet 0  . DULERA 200-5 MCG/ACT AERO Take 2 puffs by mouth 2 (two) times daily.  4  . lidocaine (LIDODERM) 5 % Place 1 patch onto the skin as needed for pain.  0  . omeprazole (PRILOSEC) 20 MG capsule Take 20 mg by mouth daily.    . potassium chloride SA (K-DUR,KLOR-CON) 20 MEQ tablet Take 1 tablet (20 mEq total) by mouth daily. (Patient taking  differently: Take 10 mEq by mouth daily. ) 14 tablet 0  . pregabalin (LYRICA) 100 MG capsule Take 100 mg by mouth 2 (two) times daily.     . promethazine (PHENERGAN) 25 MG tablet Take 1 tablet (25 mg total) by mouth every 6 (six) hours as needed for nausea or vomiting. 20 tablet 0  . PROAIR HFA 108 (90 Base) MCG/ACT inhaler Inhale 2  puffs into the lungs every 6 (six) hours as needed.  3  . diazepam (VALIUM) 5 MG tablet Take 1 tablet (5 mg total) by mouth every 6 (six) hours as needed for anxiety. 45 tablet 0  . ondansetron (ZOFRAN) 4 MG tablet Take 1 tablet (4 mg total) by mouth every 8 (eight) hours as needed for nausea or vomiting. (Patient not taking: Reported on 08/31/2017) 20 tablet 0  . pantoprazole (PROTONIX) 40 MG tablet Take 1 tablet (40 mg total) by mouth daily. (Patient not taking: Reported on 08/31/2017) 30 tablet 0  . SPIRIVA HANDIHALER 18 MCG inhalation capsule Place 1 capsule into inhaler and inhale daily.  4   No facility-administered medications prior to visit.    Review of Systems  Constitutional: Negative for chills, fever, malaise/fatigue and weight loss.  HENT: Negative for hearing loss, sore throat and tinnitus.   Eyes: Negative for blurred vision and double vision.  Respiratory: Positive for cough, hemoptysis, sputum production, shortness of breath and wheezing. Negative for stridor.   Cardiovascular: Negative for chest pain, palpitations, orthopnea, leg swelling and PND.  Gastrointestinal: Negative for abdominal pain, constipation, diarrhea, heartburn, nausea and vomiting.  Genitourinary: Negative for dysuria, hematuria and urgency.  Musculoskeletal: Negative for joint pain and myalgias.  Skin: Negative for itching and rash.  Neurological: Negative for dizziness, tingling, weakness and headaches.  Endo/Heme/Allergies: Negative for environmental allergies. Does not bruise/bleed easily.  Psychiatric/Behavioral: Negative for depression. The patient is not nervous/anxious  and does not have insomnia.   All other systems reviewed and are negative.    Objective:  Physical Exam Vitals reviewed.  Constitutional:      General: She is not in acute distress.    Appearance: She is well-developed.  HENT:     Head: Normocephalic and atraumatic.     Mouth/Throat:     Pharynx: No oropharyngeal exudate.  Eyes:     Conjunctiva/sclera: Conjunctivae normal.     Pupils: Pupils are equal, round, and reactive to light.  Neck:     Vascular: No JVD.     Trachea: No tracheal deviation.     Comments: Loss of supraclavicular fat Cardiovascular:     Rate and Rhythm: Normal rate and regular rhythm.     Heart sounds: S1 normal and S2 normal.     Comments: Distant heart tones Pulmonary:     Effort: No tachypnea or accessory muscle usage.     Breath sounds: No stridor. Decreased breath sounds (throughout all lung fields) and wheezing present. No rhonchi or rales.  Abdominal:     General: Bowel sounds are normal. There is no distension.     Palpations: Abdomen is soft.     Tenderness: There is no abdominal tenderness.  Musculoskeletal:        General: Deformity (muscle wasting ) present.  Skin:    General: Skin is warm and dry.     Capillary Refill: Capillary refill takes less than 2 seconds.     Findings: No rash.  Neurological:     Mental Status: She is alert and oriented to person, place, and time.  Psychiatric:        Behavior: Behavior normal.      Vitals:   12/12/19 1535  BP: 120/70  Pulse: 100  Temp: 97.8 F (36.6 C)  TempSrc: Temporal  SpO2: 91%  Weight: 191 lb 12.8 oz (87 kg)  Height: 5\' 7"  (1.702 m)   91% on RA patient is supposed to be on 2 L nasal cannula  but did not bring with her today. BMI Readings from Last 3 Encounters:  12/12/19 30.04 kg/m  06/07/18 29.38 kg/m  09/05/17 25.82 kg/m   Wt Readings from Last 3 Encounters:  12/12/19 191 lb 12.8 oz (87 kg)  06/07/18 182 lb (82.6 kg)  09/05/17 160 lb (72.6 kg)     CBC      Component Value Date/Time   WBC 8.3 08/31/2017 1032   RBC 4.71 08/31/2017 1032   HGB 14.0 08/31/2017 1032   HCT 43.1 08/31/2017 1032   PLT 327 08/31/2017 1032   MCV 91.5 08/31/2017 1032   MCH 29.7 08/31/2017 1032   MCHC 32.5 08/31/2017 1032   RDW 14.4 08/31/2017 1032   LYMPHSABS 1.5 08/31/2017 1032   MONOABS 0.7 08/31/2017 1032   EOSABS 0.1 08/31/2017 1032   BASOSABS 0.0 08/31/2017 1032    Chest Imaging: October 2019: Chest x-ray: Hyperexpanded lung fields.  Consistent with COPD. The patient's images have been independently reviewed by me.    Pulmonary Functions Testing Results: PFT Results Latest Ref Rng & Units 08/30/2018  FVC-Pre L 3.17  FVC-Predicted Pre % 84  FVC-Post L 2.98  FVC-Predicted Post % 79  Pre FEV1/FVC % % 46  Post FEV1/FCV % % 49  FEV1-Pre L 1.45  FEV1-Predicted Pre % 49  FEV1-Post L 1.47  DLCO UNC% % 42  DLCO COR %Predicted % 42  TLC L 7.08  TLC % Predicted % 132  RV % Predicted % 210    FeNO: noen   Pathology: none  Echocardiogram: none   Heart Catheterization: none     Assessment & Plan:     ICD-10-CM   1. Hemoptysis  R04.2   2. Cough  R05 CT CHEST WO CONTRAST  3. Stage 3 severe COPD by GOLD classification (HCC)  J44.9   4. Tobacco use  Z72.0   5. Cigarette smoker  F17.210     Discussion:  This is a 54 year old current smoker severe COPD with cough sputum production and hemoptysis.  Plan: Patient needs a CT scan of the chest for evaluation of hemoptysis in setting of advanced lung disease concerning for potential underlying malignancy. Patient should continue current inhaler regimen to include Dulera. She also will start her on Spiriva Respimat which is a mist version she is willing to try this due to the defects not a dry powder inhaler.  Patient must quit smoking. There is nothing that we can do to improve her lung function more or improve her respiratory symptoms more than her quitting smoking.  Patient was aggressively  counseled today in the office on her smoking cessation. There is no indication for advancement of her treatment regimen in the setting of ongoing tobacco abuse with the severity of her lung disease.  She will always be short of breath and always have respiratory symptoms while she is smoking.  Greater than 50% of this patient's 60-minute office visit was spent face-to-face discussing the above recommendations and treatment plan review of past medical record as well as chest imaging and PFTs.   Current Outpatient Medications:  .  citalopram (CELEXA) 40 MG tablet, Take 40 mg by mouth every evening., Disp: , Rfl:  .  cyclobenzaprine (FLEXERIL) 10 MG tablet, Take 10 mg by mouth every 8 (eight) hours as needed., Disp: , Rfl: 2 .  diazepam (VALIUM) 5 MG tablet, Take 1 tablet (5 mg total) by mouth at bedtime as needed for anxiety. (Patient taking differently: Take 5 mg by mouth 3 (three) times  daily. ), Disp: 5 tablet, Rfl: 0 .  DULERA 200-5 MCG/ACT AERO, Take 2 puffs by mouth 2 (two) times daily., Disp: , Rfl: 4 .  lidocaine (LIDODERM) 5 %, Place 1 patch onto the skin as needed for pain., Disp: , Rfl: 0 .  omeprazole (PRILOSEC) 20 MG capsule, Take 20 mg by mouth daily., Disp: , Rfl:  .  potassium chloride SA (K-DUR,KLOR-CON) 20 MEQ tablet, Take 1 tablet (20 mEq total) by mouth daily. (Patient taking differently: Take 10 mEq by mouth daily. ), Disp: 14 tablet, Rfl: 0 .  pregabalin (LYRICA) 100 MG capsule, Take 100 mg by mouth 2 (two) times daily. , Disp: , Rfl:  .  promethazine (PHENERGAN) 25 MG tablet, Take 1 tablet (25 mg total) by mouth every 6 (six) hours as needed for nausea or vomiting., Disp: 20 tablet, Rfl: 0 .  Tiotropium Bromide Monohydrate (SPIRIVA RESPIMAT) 2.5 MCG/ACT AERS, Inhale 2 puffs into the lungs daily., Disp: 4 g, Rfl: 0 .  Tiotropium Bromide Monohydrate (SPIRIVA RESPIMAT) 2.5 MCG/ACT AERS, Inhale 2 puffs into the lungs daily., Disp: 4 g, Rfl: 3   Josephine Igo, DO [facility] 12/12/2019 7:01 PM

## 2019-12-19 ENCOUNTER — Telehealth: Payer: Self-pay | Admitting: Pulmonary Disease

## 2019-12-19 ENCOUNTER — Ambulatory Visit (HOSPITAL_COMMUNITY): Payer: Medicaid Other

## 2019-12-20 NOTE — Telephone Encounter (Signed)
lmtc for pt.

## 2019-12-20 NOTE — Telephone Encounter (Signed)
Called and spoke with the pt  She states that she is needing her last ov note to go to her PCP Dr Bryna Colander Note sent via Epic  Nothing further needed

## 2019-12-20 NOTE — Telephone Encounter (Signed)
Pt is returning call - CB# [phone]

## 2019-12-24 ENCOUNTER — Telehealth: Payer: Self-pay | Admitting: Pulmonary Disease

## 2019-12-24 DIAGNOSIS — R042 Hemoptysis: Secondary | ICD-10-CM

## 2019-12-24 DIAGNOSIS — J449 Chronic obstructive pulmonary disease, unspecified: Secondary | ICD-10-CM

## 2019-12-24 DIAGNOSIS — F17210 Nicotine dependence, cigarettes, uncomplicated: Secondary | ICD-10-CM

## 2019-12-24 DIAGNOSIS — R059 Cough, unspecified: Secondary | ICD-10-CM

## 2019-12-24 DIAGNOSIS — R05 Cough: Secondary | ICD-10-CM

## 2019-12-24 NOTE — Telephone Encounter (Signed)
Spoke with patient. She was crying on the phone because she was told by Medicaid that the CT approval had been denied. She is currently scheduled for a CT tomorrow at [facility] at 645pm. She really wants to have the CT since she is concerned about having lung cancer. Medicaid did not provide her with a reason why the CT was denied.   She wants to know what will be the next steps if she is not able to get the CT.   PCCs, is it possible to find out why the CT was denied?   Dr. Tonia Brooms, she would like to know the next steps in the event she can not have the CT scan.   Thanks!

## 2019-12-25 ENCOUNTER — Ambulatory Visit (HOSPITAL_COMMUNITY): Payer: Medicaid Other

## 2019-12-25 NOTE — Telephone Encounter (Signed)
Dr. Tonia Brooms,  based on response received, last chest xray was 08/30/2018.  Chest xray ordered. I called the patient and advised her of the results. Patient stated she will try to come by Friday or Monday.  Sending as fyi.

## 2019-12-25 NOTE — Telephone Encounter (Signed)
PCCM:  Please find out the reason for denial. Ordered for evaluation of hemoptysis in a smoker.   Debra Igo, DO [place] [facility] 12/25/2019 8:52 AM

## 2019-12-25 NOTE — Telephone Encounter (Signed)
Per Cherina, this was already sent to the [facility] to check into. Have to wait on response from them to determine reason for denial.

## 2019-12-25 NOTE — Telephone Encounter (Signed)
On the denial was not a recent cxr done on this pt if so no copy in the chart Tobe Sos

## 2019-12-25 NOTE — Telephone Encounter (Signed)
PCCM:  Agree. Order CXR. She will still need the CT following. Can she come to the office to have the CXR completed today?   Thanks   Josephine Igo, DO [facility] 12/25/2019 10:10 AM

## 2019-12-26 ENCOUNTER — Telehealth: Payer: Self-pay | Admitting: Pulmonary Disease

## 2019-12-26 NOTE — Telephone Encounter (Signed)
I called [facility] to speak with Debra [name] in regards to the patient and she was not available and the front desk rep said that she will let her know that I called and have her call me back.

## 2019-12-26 NOTE — Telephone Encounter (Signed)
Received a call back from [place]. She states that the patient called her stating that she needs a chest x-ray and wanted to get it there since it was closer to her home than our office. I advised her that this was fine and to fax [place] with the results. She took down the fax number and she states that she will get patient in this week and fax [place] results.

## 2019-12-27 NOTE — Telephone Encounter (Signed)
Will forward to Dr. Icard as FYI.  

## 2019-12-27 NOTE — Telephone Encounter (Signed)
Thanks BLI   

## 2020-01-08 NOTE — Telephone Encounter (Addendum)
Report received and placed in Dr. Myrlene Broker look ats. Nothing further needed at this time.

## 2020-01-11 NOTE — Telephone Encounter (Signed)
Patient would like to know if Dr. Tonia Brooms received the Chest Xray.  Patient would like to schedule a CT scan.  Patient phone number is [phone].

## 2020-01-11 NOTE — Telephone Encounter (Signed)
Called and spoke with pt. Pt was calling to see if she might be able to be scheduled for the CT. Pt had cxr performed 2/5 at PCP. Results of the cxr were faxed to our office which are currently in Dr. Myrlene Broker lookat folder. Dr. Tonia Brooms is out of the office this week.  Due to insurance requiring the cxr and possibly will need to have results of the cxr documented prior to the CT able to be scheduled for precert purposes, Beth, please advise if you are okay reviewing the cxr so we can get pt scheduled for the CT.

## 2020-01-11 NOTE — Telephone Encounter (Signed)
Please have me review CXR on Monday and then scan into chart. Based on that will see if CT chest is warranted.

## 2020-01-14 NOTE — Telephone Encounter (Signed)
Received documentation of the cxr and showed it to [facility]. Per [place], route to Dr. Tonia Brooms for him to see if he still wants to order a ct on pt.  Findings from chest xray: Lungs are clear and the cardiac and mediastinal silhouettes are unremarkable. There is no acute disease.  Will place xray results back in Dr. Myrlene Broker review folder.

## 2020-01-15 NOTE — Telephone Encounter (Signed)
ATC pt. VM box is full. WCB.  

## 2020-01-15 NOTE — Telephone Encounter (Signed)
Pt returned call. Informed her of the recs per Dr. Tonia Brooms. Pt states she does not have hemoptysis currently but it is intermittent and has been occurring for the last 5 months. Order placed for first available CT chest WO contrast.   Will forward to Dr. Tonia Brooms as Lorain Childes.

## 2020-01-15 NOTE — Telephone Encounter (Signed)
Thanks Josephine Igo, DO [facility] 01/15/2020 1:46 PM

## 2020-01-15 NOTE — Telephone Encounter (Signed)
The initial indication was for hemoptysis in a smoker.  If her hemoptysis has continued she needs a CT Chest. Insurance denied initially and we did a CXR.  However, CXR are not sensitive enough for eval of hemoptysis  I would prefer the patient to obtain a noncontrasted CT chest.   Debra Igo, DO [place] [facility] 01/15/2020 7:15 AM

## 2020-01-24 ENCOUNTER — Other Ambulatory Visit: Payer: Self-pay | Admitting: Pulmonary Disease

## 2020-01-24 DIAGNOSIS — R042 Hemoptysis: Secondary | ICD-10-CM

## 2020-01-24 NOTE — Addendum Note (Signed)
Addended by: Sheran Luz on: 01/24/2020 02:51 PM   Modules accepted: Orders

## 2020-01-25 ENCOUNTER — Ambulatory Visit (HOSPITAL_COMMUNITY): Payer: Medicaid Other

## 2020-01-31 ENCOUNTER — Telehealth: Payer: Self-pay | Admitting: Pulmonary Disease

## 2020-01-31 NOTE — Telephone Encounter (Signed)
ATC patient.  LM to call back. 

## 2020-01-31 NOTE — Telephone Encounter (Signed)
Yes. Agreed Thanks BLI   Josephine Igo, DO [place] [facility] 01/31/2020 2:32 PM

## 2020-01-31 NOTE — Telephone Encounter (Signed)
Dr. Tonia Brooms,  This patient was seen by you on 12/12/19 for hemoptysis, cough and stage 3 severe copd, current smoker.  According to the note she is supposed to be on 3L at night and 2L during day, but was not on her oxygen when she came for her appointment.  If you agree to an order for POC, she will have to come in for a qualifying walk. Also, your note had for her to return in 4 years, was that correct?  Please advise, thank you.

## 2020-02-01 NOTE — Telephone Encounter (Signed)
ATC pt, there was no answer and I could not leave a message due to her VM being full. 

## 2020-02-04 NOTE — Telephone Encounter (Signed)
ATC pt, there was no answer and I could not leave a message due to her VM being full. We have attempted to contact pt several times with no success or call back from pt. Per triage protocol, message will be closed.  

## 2020-02-07 ENCOUNTER — Ambulatory Visit (HOSPITAL_COMMUNITY)
Admission: RE | Admit: 2020-02-07 | Discharge: 2020-02-07 | Disposition: A | Discharge status: Home / Self Care | Payer: Medicaid Other | Source: Ambulatory Visit | Attending: Pulmonary Disease | Admitting: Pulmonary Disease

## 2020-02-07 ENCOUNTER — Other Ambulatory Visit: Payer: Self-pay

## 2020-02-07 ENCOUNTER — Telehealth: Payer: Self-pay | Admitting: Pulmonary Disease

## 2020-02-07 DIAGNOSIS — R042 Hemoptysis: Secondary | ICD-10-CM

## 2020-02-07 DIAGNOSIS — R911 Solitary pulmonary nodule: Secondary | ICD-10-CM

## 2020-02-07 DIAGNOSIS — IMO0001 Reserved for inherently not codable concepts without codable children: Secondary | ICD-10-CM

## 2020-02-07 MED ORDER — IOHEXOL 300 MG/ML  SOLN
75.0000 mL | Freq: Once | INTRAMUSCULAR | Status: AC | PRN
  Administered 2020-02-07: 75 mL via INTRAVENOUS

## 2020-02-07 NOTE — Telephone Encounter (Signed)
Spoke with the pt  She is requesting the results of her ct chest from 02/07/20 Please advise, thanks!

## 2020-02-07 NOTE — Telephone Encounter (Signed)
PCCM:  1. No acute findings are noted in the chest. 2. 4 mm subpleural nodule in the posterior aspect of the left upper lobe abutting the major fissure. No follow-up needed if patient is low-risk. Non-contrast chest CT can be considered in 12 months if patient is high-risk. This recommendation follows the consensus statement: Guidelines for Management of Incidental Pulmonary Nodules Detected on CT Images: From the Fleischner Society 2017; Radiology 2017; 284:228-243. 3. Mild diffuse bronchial wall thickening with very mild centrilobular and paraseptal emphysema; imaging findings suggestive of underlying COPD. 4. Aortic atherosclerosis, in addition to three-vessel coronary artery disease. Please note that although the presence of coronary artery calcium documents the presence of coronary artery disease, the severity of this disease and any potential stenosis cannot be assessed on this non-gated CT examination. Assessment for potential risk factor modification, dietary therapy or pharmacologic therapy may be warranted, if clinically indicated. 5. Probable hepatic steatosis.  She has a small nodule that she will need follow up in 12 months. Ok to reorder scan for 12 months from now.  No other worrisome lesions identifed. Consider follow up with PCP regarding aortic cacifications.  Debra Igo, DO [place] [facility] 02/07/2020 7:05 PM

## 2020-02-08 NOTE — Telephone Encounter (Signed)
Spoke with pt, aware of results/recs.  12 month ct ordered.  Nothing further needed at this time- will close encounter.

## 2020-02-12 ENCOUNTER — Telehealth: Payer: Self-pay | Admitting: Pulmonary Disease

## 2020-02-12 NOTE — Telephone Encounter (Signed)
Spoke with pt, she is requesting her records be sent to her PCP dr. [name] . I advised her that I will send the records to him. Pt understood and nothing further is needed.    [phone]

## 2020-03-19 ENCOUNTER — Other Ambulatory Visit: Payer: Self-pay

## 2020-03-19 ENCOUNTER — Ambulatory Visit (INDEPENDENT_AMBULATORY_CARE_PROVIDER_SITE_OTHER): Payer: Medicaid Other | Admitting: Pulmonary Disease

## 2020-03-19 ENCOUNTER — Encounter: Payer: Self-pay | Admitting: Pulmonary Disease

## 2020-03-19 DIAGNOSIS — R911 Solitary pulmonary nodule: Secondary | ICD-10-CM

## 2020-03-19 DIAGNOSIS — F17200 Nicotine dependence, unspecified, uncomplicated: Secondary | ICD-10-CM | POA: Diagnosis not present

## 2020-03-19 DIAGNOSIS — J449 Chronic obstructive pulmonary disease, unspecified: Secondary | ICD-10-CM | POA: Diagnosis not present

## 2020-03-19 DIAGNOSIS — J9611 Chronic respiratory failure with hypoxia: Secondary | ICD-10-CM | POA: Diagnosis not present

## 2020-03-19 DIAGNOSIS — Z9981 Dependence on supplemental oxygen: Secondary | ICD-10-CM

## 2020-03-19 MED ORDER — DULERA 200-5 MCG/ACT IN AERO: 2.0000 | INHALATION_SPRAY | Freq: Two times a day (BID) | RESPIRATORY_TRACT | 6 refills | Status: DC

## 2020-03-19 MED ORDER — SPIRIVA RESPIMAT 2.5 MCG/ACT IN AERS: 2.0000 | INHALATION_SPRAY | Freq: Every day | RESPIRATORY_TRACT | 6 refills | Status: DC

## 2020-03-19 NOTE — Progress Notes (Signed)
Virtual Visit via Telephone Note  I connected with Debra [name] on 03/19/20 at  2:00 PM EDT by telephone and verified that I am speaking with the correct person using two identifiers.  Location: Patient: Debra [name] Provider: Garner Nash, DO    I discussed the limitations, risks, security and privacy concerns of performing an evaluation and management service by telephone and the availability of in person appointments. I also discussed with the patient that there may be a patient responsible charge related to this service. The patient expressed understanding and agreed to proceed.  History of Present Illness:  This is a 54 year old female initially seen by me in January 2021 to establish care.  Former patient of Dr. Luan Pulling.  Chronic hypoxemic respiratory failure on 3 L of oxygen diagnosis of COPD and current smoker.  Unfortunately even since the last time we met she is still smoking.  She was started on a new inhaler regimen to include Dulera plus Spiriva Respimat.  She is doing very well with the Spiriva mass format and requests refills for that.  At this point she is really struggling with quitting smoking.  We discussed this in detail today via phone including various methods to help quit smoking.  She is currently working with the neurosurgery group with back pain and radicular pain which will possibly lead to surgical evaluation.  Otherwise she is doing better from a respiratory standpoint that I can tell via phone today.   Observations/Objective: Able to speak in full sentences  No respiratory distress  CT Scan:  IMPRESSION: 1. No acute findings are noted in the chest. 2. 4 mm subpleural nodule in the posterior aspect of the left upper lobe abutting the major fissure. No follow-up needed if patient is low-risk. Non-contrast chest CT can be considered in 12 months if patient is high-risk. This recommendation follows the consensus statement: Guidelines for Management of  Incidental Pulmonary Nodules Detected on CT Images: From the Fleischner Society 2017; Radiology 2017; 284:228-243. 3. Mild diffuse bronchial wall thickening with very mild centrilobular and paraseptal emphysema; imaging findings suggestive of underlying COPD. 4. Aortic atherosclerosis, in addition to three-vessel coronary artery disease. Please note that although the presence of coronary artery calcium documents the presence of coronary artery disease, the severity of this disease and any potential stenosis cannot be assessed on this non-gated CT examination. Assessment for potential risk factor modification, dietary therapy or pharmacologic therapy may be warranted, if clinically indicated. 5. Probable hepatic steatosis.  Assessment and Plan:  Severe COPD Chronic oxygen dependent respiratory failure Current smoker  4 mm lung nodule  Plan:  Continue dulera Continue Spriva respimat Refills given today 12 Month noncontrasted CT follow-up  Follow Up Instructions:  RTC in 6 months or as needed    I discussed the assessment and treatment plan with the patient. The patient was provided an opportunity to ask questions and all were answered. The patient agreed with the plan and demonstrated an understanding of the instructions.   The patient was advised to call back or seek an in-person evaluation if the symptoms worsen or if the condition fails to improve as anticipated.  I provided 24 minutes of non-face-to-face time during this encounter.   Garner Nash, DO

## 2020-03-19 NOTE — Patient Instructions (Signed)
Thank you for visiting Dr. Tonia Brooms at [facility]. Today we recommend the following: Orders Placed This Encounter  Procedures  . CT Chest Wo Contrast   Meds ordered this encounter  Medications  . DULERA 200-5 MCG/ACT AERO    Sig: Inhale 2 puffs into the lungs 2 (two) times daily.    Dispense:  13 g    Refill:  6  . Tiotropium Bromide Monohydrate (SPIRIVA RESPIMAT) 2.5 MCG/ACT AERS    Sig: Inhale 2 puffs into the lungs daily.    Dispense:  4 g    Refill:  6   Return in about 6 months (around 09/18/2020), or if symptoms worsen or fail to improve.    Please do your part to reduce the spread of COVID-19.

## 2020-03-24 ENCOUNTER — Telehealth: Payer: Self-pay | Admitting: Pulmonary Disease

## 2020-03-24 NOTE — Telephone Encounter (Signed)
Spoke with patient.  She states her sole of feet hurt. I advised her to speak to her PCP, then patient disclosed she is supposed to have back surgery. She states she will try to call her PCP or back surgeon tomorrow. I asked the patient if her feet were swelling she denies any swelling just pain on the sole of her feet.  I advised patient if she thinks she needs to be seen for her breathing we can make her an appointment. Patient declined appt.   Nothing further needed at this time.

## 2020-04-10 ENCOUNTER — Ambulatory Visit: Payer: Medicaid Other | Admitting: Adult Health

## 2020-04-15 ENCOUNTER — Telehealth: Payer: Self-pay | Admitting: *Deleted

## 2020-04-15 NOTE — Telephone Encounter (Signed)
We received a fax request from patient's pharmacy requesting refill on Nystatin.  I called the patient and she states that when she uses her albuterol she gets thrush.  I discussed with her that there is not a steroid in the albuterol.  She is also on Dulera and Spiriva.  Please advise on what you would like for [place] to do with the request.

## 2020-04-15 NOTE — Telephone Encounter (Signed)
Ok for nystatin refill Make sure to rinse mouth after dulera use This is the likely cause Josephine Igo, DO [place] [facility] 04/15/2020 7:10 PM

## 2020-04-16 ENCOUNTER — Other Ambulatory Visit: Payer: Self-pay | Admitting: *Deleted

## 2020-04-16 MED ORDER — NYSTATIN 100000 UNIT/ML MT SUSP: 5.0000 mL | Freq: Four times a day (QID) | OROMUCOSAL | 0 refills | Status: DC

## 2020-05-15 ENCOUNTER — Other Ambulatory Visit: Payer: Self-pay | Admitting: Neurosurgery

## 2020-06-09 ENCOUNTER — Other Ambulatory Visit: Payer: Self-pay | Admitting: Neurosurgery

## 2020-06-13 ENCOUNTER — Inpatient Hospital Stay: Admit: 2020-06-13 | Payer: Medicaid Other | Admitting: Neurosurgery

## 2020-06-13 SURGERY — POSTERIOR LUMBAR FUSION 1 LEVEL
Anesthesia: General

## 2020-07-17 ENCOUNTER — Inpatient Hospital Stay (HOSPITAL_COMMUNITY): Admission: RE | Admit: 2020-07-17 | Payer: Medicaid Other | Source: Home / Self Care | Admitting: Neurosurgery

## 2020-07-17 ENCOUNTER — Encounter (HOSPITAL_COMMUNITY): Admission: RE | Payer: Self-pay | Source: Home / Self Care

## 2020-07-17 SURGERY — POSTERIOR LUMBAR FUSION 1 LEVEL
Anesthesia: General

## 2020-07-21 ENCOUNTER — Other Ambulatory Visit: Payer: Self-pay | Admitting: Neurosurgery

## 2020-07-24 ENCOUNTER — Encounter (HOSPITAL_COMMUNITY): Payer: Self-pay | Admitting: Neurosurgery

## 2020-07-24 ENCOUNTER — Other Ambulatory Visit: Payer: Self-pay

## 2020-07-24 NOTE — Progress Notes (Signed)
Anesthesia Chart Review: Same-day work-up  Follows with pulmonology for severe COPD with chronic oxygen dependent respiratory failure.  Uses O2 as needed during the day and 3 L continuous at night.  Last seen by Dr. Tonia Brooms 03/19/2020 via telephone encounter and per his note she is doing better since starting new inhaler regimen of Dulera plus Spiriva Respimat.  She is still struggling to quit smoking.  Did discuss that she may have upcoming surgery for her low back and radicular pain.  Patient seen by cardiology in 2018 for evaluation of atypical chest pain.  Nuclear stress was low risk, nonischemic.  Echo showed EF 65%, normal wall motion, grade 1 DD, no significant valvular abnormalities.  Pain was felt to be noncardiac.  Patient last seen by PCP 05/08/2020, upcoming surgery discussed.  Per note, "patient does have recent history of leukocytosis which is interfered with the patient's proposed back surgery.  Most recent CBC does demonstrate normal WBC, with normal hemoglobin and hematocrit.  CBC from 07/10/2020 on chart, WNL.  Patient will need day of surgery labs, eval, and EKG (tracing received from PCP office is of poor quality, illegible).  Nuclear stress 09/05/2017:  Nuclear stress EF: 49%.  Blood pressure demonstrated a normal response to exercise.  There was no ST segment deviation noted during stress.  This is a low risk study.  The left ventricular ejection fraction is mildly decreased (45-54%).   Low risk stress nuclear study with no ischemia or infarction; EF 49 with mild global hypokinesis; suggest echo to further assess; cannot R/O left breast density; suggest mammogram if not performed recently.  TTE 07/13/2017: - Left ventricle: The cavity size was normal. Wall thickness was  normal. Systolic function was normal. The estimated ejection  fraction was in the range of 60% to 65%. Wall motion was normal;  there were no regional wall motion abnormalities. Doppler   parameters are consistent with abnormal left ventricular  relaxation (grade 1 diastolic dysfunction).  - Aortic valve: There was no stenosis.  - Mitral valve: There was no regurgitation.  - Right ventricle: The cavity size was normal. Systolic function  was normal.  - Pulmonary arteries: No complete TR doppler jet so unable to  estimate PA systolic pressure.  - Inferior vena cava: The vessel was normal in size. The  respirophasic diameter changes were in the normal range (>= 50%),  consistent with normal central venous pressure.   Impressions:   - Normal LV size with EF 60-65%. Normal RV size and systolic  function. No significant valvular abnormalities.    Zannie Cove [facility] Short Stay Center/Anesthesiology Phone [phone] 07/24/2020 5:01 PM

## 2020-07-24 NOTE — Anesthesia Preprocedure Evaluation (Addendum)
Anesthesia Evaluation  Patient identified by MRN, date of birth, ID band Patient awake    Reviewed: Allergy & Precautions, NPO status , Patient's Chart, lab work & pertinent test results  History of Anesthesia Complications Negative for: history of anesthetic complications  Airway Mallampati: II  TM Distance: >3 FB Neck ROM: Full    Dental  (+) Dental Advisory Given   Pulmonary COPD,  COPD inhaler and oxygen dependent, Current Smoker and Patient abstained from smoking.,  07/25/2020 SARS coronavirus NEG   breath sounds clear to auscultation       Cardiovascular negative cardio ROS   Rhythm:Regular Rate:Normal  '18 Stress: EF: 49%. BP demonstrated a normal response to exercise. no ST segment deviation noted during stress.  This is a low risk study.  '18 ECHO: EF 60-65%, normal wall motion, no sig valvular abnormalities   Neuro/Psych Anxiety Depression Chronic back pain    GI/Hepatic Neg liver ROS, hiatal hernia, GERD  Medicated and Controlled,  Endo/Other  negative endocrine ROS  Renal/GU negative Renal ROS     Musculoskeletal   Abdominal   Peds  Hematology negative hematology ROS (+)   Anesthesia Other Findings   Reproductive/Obstetrics                          Anesthesia Physical Anesthesia Plan  ASA: III  Anesthesia Plan: General   Post-op Pain Management:    Induction: Intravenous  PONV Risk Score and Plan: 3 and Scopolamine patch - Pre-op, Ondansetron and Dexamethasone  Airway Management Planned: Oral ETT  Additional Equipment: None  Intra-op Plan:   Post-operative Plan: Extubation in OR  Informed Consent: I have reviewed the patients History and Physical, chart, labs and discussed the procedure including the risks, benefits and alternatives for the proposed anesthesia with the patient or authorized representative who has indicated his/her understanding and acceptance.      Dental advisory given  Plan Discussed with: CRNA and Surgeon  Anesthesia Plan Comments: (PAT note by Antionette Poles, PA-C:  Follows with pulmonology for severe COPD with chronic oxygen dependent respiratory failure.  Uses O2 as needed during the day and 3 L continuous at night.  Last seen by Dr. Tonia Brooms 03/19/2020 via telephone encounter and per his note she is doing better since starting new inhaler regimen of Dulera plus Spiriva Respimat.  She is still struggling to quit smoking.  Did discuss that she may have upcoming surgery for her low back and radicular pain.  Patient seen by cardiology in 2018 for evaluation of atypical chest pain.  Nuclear stress was low risk, nonischemic.  Echo showed EF 65%, normal wall motion, grade 1 DD, no significant valvular abnormalities.  Pain was felt to be noncardiac.  Patient last seen by PCP 05/08/2020, upcoming surgery discussed.  Per note, "patient does have recent history of leukocytosis which is interfered with the patient's proposed back surgery.  Most recent CBC does demonstrate normal WBC, with normal hemoglobin and hematocrit.  CBC from 07/10/2020 on chart, WNL.  Patient will need day of surgery labs, eval, and EKG (tracing received from PCP office is of poor quality, illegible).  Nuclear stress 09/05/2017: Nuclear stress EF: 49%. Blood pressure demonstrated a normal response to exercise. There was no ST segment deviation noted during stress. This is a low risk study. The left ventricular ejection fraction is mildly decreased (45-54%).   Low risk stress nuclear study with no ischemia or infarction; EF 49 with mild global hypokinesis; suggest echo  to further assess; cannot R/O left breast density; suggest mammogram if not performed recently.  TTE 07/13/2017: - Left ventricle: The cavity size was normal. Wall thickness was  normal. Systolic function was normal. The estimated ejection  fraction was in the range of 60% to 65%. Wall motion was  normal;  there were no regional wall motion abnormalities. Doppler  parameters are consistent with abnormal left ventricular  relaxation (grade 1 diastolic dysfunction).  - Aortic valve: There was no stenosis.  - Mitral valve: There was no regurgitation.  - Right ventricle: The cavity size was normal. Systolic function  was normal.  - Pulmonary arteries: No complete TR doppler jet so unable to  estimate PA systolic pressure.  - Inferior vena cava: The vessel was normal in size. The  respirophasic diameter changes were in the normal range (>= 50%),  consistent with normal central venous pressure.   Impressions:   - Normal LV size with EF 60-65%. Normal RV size and systolic  function. No significant valvular abnormalities.   )      Anesthesia Quick Evaluation

## 2020-07-24 NOTE — Progress Notes (Signed)
Debra [name] denies chest pain. Patient has COPD, she gets short of breath at times, she has oxygen that she can use prn during the day. Patient uses 3L of oxygen at night time. Debra [name] states that she checks pulse ox, it runs in the 90'S.  Patient's pulmonologist is Dr. Tonia Brooms.  I called and left a voice message on Debra [name]'s vm asking for any records she has received from PCP, Dr. Levonne Spiller.   Debra [name] informed me that she had all the labs needed at PCP office this week. I did not find the labs, but I told the patient that I had left [name] a message earlier. Labs were found [place]  - the lab was CBC with Diff.  I explained to Debra [name] that a T/S will need to be done.   Debra [name] is arriving at 0900 to have Covid test before surgery. I instructed patient to bring [employer] inhaler with her, patient said that she was not bring it, she is not using it; I explained to patient that the anesthesiology request that she bring it in.  I informed patient that she should not smoke or vape anymore today or in am.  I tried to give Debra [name] the Pre- Surgery phone number- she refused to take it- "there is no way I will be late, I will not take the number."  I spoke to [facility] after patient left and asked if she has any Dr.'s office visits- she faxed it to me, it was from 10/2019; patient said saw PCP this spring.  I called Dr.Kikel and spoke with Malachi Bonds in Medical Records, Malachi Bonds found 05/2020 office notes and said she will fax them to [facility], Pre- Admission #.

## 2020-07-25 ENCOUNTER — Inpatient Hospital Stay (HOSPITAL_COMMUNITY): Payer: Medicaid Other | Admitting: Physician Assistant

## 2020-07-25 ENCOUNTER — Inpatient Hospital Stay (HOSPITAL_COMMUNITY): Payer: Medicaid Other

## 2020-07-25 ENCOUNTER — Inpatient Hospital Stay (HOSPITAL_COMMUNITY)
Admission: RE | Admit: 2020-07-25 | Discharge: 2020-07-27 | DRG: 460 | Disposition: A | Discharge status: Home Health | Payer: Medicaid Other | Attending: Neurosurgery | Admitting: Neurosurgery

## 2020-07-25 ENCOUNTER — Encounter (HOSPITAL_COMMUNITY): Payer: Self-pay | Admitting: Neurosurgery

## 2020-07-25 ENCOUNTER — Encounter (HOSPITAL_COMMUNITY)
Admission: RE | Disposition: A | Discharge status: Home Health | Payer: Self-pay | Source: Home / Self Care | Attending: Neurosurgery

## 2020-07-25 DIAGNOSIS — F329 Major depressive disorder, single episode, unspecified: Secondary | ICD-10-CM | POA: Diagnosis present

## 2020-07-25 DIAGNOSIS — Z9981 Dependence on supplemental oxygen: Secondary | ICD-10-CM | POA: Diagnosis not present

## 2020-07-25 DIAGNOSIS — F17210 Nicotine dependence, cigarettes, uncomplicated: Secondary | ICD-10-CM | POA: Diagnosis present

## 2020-07-25 DIAGNOSIS — Z981 Arthrodesis status: Secondary | ICD-10-CM | POA: Diagnosis not present

## 2020-07-25 DIAGNOSIS — Z880 Allergy status to penicillin: Secondary | ICD-10-CM

## 2020-07-25 DIAGNOSIS — Z825 Family history of asthma and other chronic lower respiratory diseases: Secondary | ICD-10-CM | POA: Diagnosis not present

## 2020-07-25 DIAGNOSIS — Z91040 Latex allergy status: Secondary | ICD-10-CM | POA: Diagnosis not present

## 2020-07-25 DIAGNOSIS — F410 Panic disorder [episodic paroxysmal anxiety] without agoraphobia: Secondary | ICD-10-CM | POA: Diagnosis present

## 2020-07-25 DIAGNOSIS — J9610 Chronic respiratory failure, unspecified whether with hypoxia or hypercapnia: Secondary | ICD-10-CM | POA: Diagnosis present

## 2020-07-25 DIAGNOSIS — M7138 Other bursal cyst, other site: Secondary | ICD-10-CM | POA: Diagnosis present

## 2020-07-25 DIAGNOSIS — J449 Chronic obstructive pulmonary disease, unspecified: Secondary | ICD-10-CM | POA: Diagnosis present

## 2020-07-25 DIAGNOSIS — M4316 Spondylolisthesis, lumbar region: Secondary | ICD-10-CM | POA: Diagnosis present

## 2020-07-25 DIAGNOSIS — Z8701 Personal history of pneumonia (recurrent): Secondary | ICD-10-CM

## 2020-07-25 DIAGNOSIS — Z419 Encounter for procedure for purposes other than remedying health state, unspecified: Secondary | ICD-10-CM

## 2020-07-25 DIAGNOSIS — M48062 Spinal stenosis, lumbar region with neurogenic claudication: Principal | ICD-10-CM | POA: Diagnosis present

## 2020-07-25 DIAGNOSIS — Z885 Allergy status to narcotic agent status: Secondary | ICD-10-CM

## 2020-07-25 DIAGNOSIS — Z881 Allergy status to other antibiotic agents status: Secondary | ICD-10-CM | POA: Diagnosis not present

## 2020-07-25 DIAGNOSIS — M47819 Spondylosis without myelopathy or radiculopathy, site unspecified: Secondary | ICD-10-CM | POA: Diagnosis present

## 2020-07-25 DIAGNOSIS — Z20822 Contact with and (suspected) exposure to covid-19: Secondary | ICD-10-CM | POA: Diagnosis present

## 2020-07-25 DIAGNOSIS — Z888 Allergy status to other drugs, medicaments and biological substances status: Secondary | ICD-10-CM | POA: Diagnosis not present

## 2020-07-25 DIAGNOSIS — K219 Gastro-esophageal reflux disease without esophagitis: Secondary | ICD-10-CM | POA: Diagnosis present

## 2020-07-25 DIAGNOSIS — M5136 Other intervertebral disc degeneration, lumbar region: Secondary | ICD-10-CM

## 2020-07-25 DIAGNOSIS — Z79899 Other long term (current) drug therapy: Secondary | ICD-10-CM

## 2020-07-25 HISTORY — DX: Depression, unspecified: F32.A

## 2020-07-25 LAB — POCT I-STAT, CHEM 8
BUN: 5 mg/dL — ABNORMAL LOW (ref 6–20)
Calcium, Ion: 1.14 mmol/L — ABNORMAL LOW (ref 1.15–1.40)
Chloride: 101 mmol/L (ref 98–111)
Creatinine, Ser: 0.5 mg/dL (ref 0.44–1.00)
Glucose, Bld: 110 mg/dL — ABNORMAL HIGH (ref 70–99)
HCT: 31 % — ABNORMAL LOW (ref 36.0–46.0)
Hemoglobin: 10.5 g/dL — ABNORMAL LOW (ref 12.0–15.0)
Potassium: 3.9 mmol/L (ref 3.5–5.1)
Sodium: 140 mmol/L (ref 135–145)
TCO2: 30 mmol/L (ref 22–32)

## 2020-07-25 LAB — SURGICAL PCR SCREEN
MRSA, PCR: NEGATIVE
Staphylococcus aureus: NEGATIVE

## 2020-07-25 LAB — SARS CORONAVIRUS 2 BY RT PCR (HOSPITAL ORDER, PERFORMED IN ~~LOC~~ HOSPITAL LAB): SARS Coronavirus 2: NEGATIVE

## 2020-07-25 SURGERY — POSTERIOR LUMBAR FUSION 1 LEVEL
Anesthesia: General

## 2020-07-25 MED ORDER — HYDROMORPHONE HCL 1 MG/ML IJ SOLN
INTRAMUSCULAR | Status: AC
  Filled 2020-07-25: qty 1

## 2020-07-25 MED ORDER — ACETAMINOPHEN 500 MG PO TABS
1000.0000 mg | ORAL_TABLET | Freq: Four times a day (QID) | ORAL | Status: AC
  Administered 2020-07-25 – 2020-07-26 (×4): 1000 mg via ORAL
  Filled 2020-07-25 (×4): qty 2

## 2020-07-25 MED ORDER — OXYCODONE HCL ER 15 MG PO T12A
15.0000 mg | EXTENDED_RELEASE_TABLET | Freq: Two times a day (BID) | ORAL | Status: DC
  Administered 2020-07-25 – 2020-07-27 (×4): 15 mg via ORAL
  Filled 2020-07-25 (×4): qty 1

## 2020-07-25 MED ORDER — THROMBIN 20000 UNITS EX SOLR
CUTANEOUS | Status: AC
  Filled 2020-07-25: qty 20000

## 2020-07-25 MED ORDER — SODIUM CHLORIDE 0.9% FLUSH
3.0000 mL | Freq: Two times a day (BID) | INTRAVENOUS | Status: DC
  Administered 2020-07-26: 3 mL via INTRAVENOUS

## 2020-07-25 MED ORDER — LIDOCAINE-EPINEPHRINE 1 %-1:100000 IJ SOLN
INTRAMUSCULAR | Status: AC
  Filled 2020-07-25: qty 1

## 2020-07-25 MED ORDER — FLUTICASONE PROPIONATE 50 MCG/ACT NA SUSP
1.0000 | Freq: Every day | NASAL | Status: DC | PRN
  Filled 2020-07-25: qty 16

## 2020-07-25 MED ORDER — SUGAMMADEX SODIUM 200 MG/2ML IV SOLN
INTRAVENOUS | Status: DC | PRN
  Administered 2020-07-25: 200 mg via INTRAVENOUS

## 2020-07-25 MED ORDER — CHLORHEXIDINE GLUCONATE 0.12 % MT SOLN: 15.0000 mL | Freq: Once | OROMUCOSAL | Status: AC

## 2020-07-25 MED ORDER — MEPERIDINE HCL 25 MG/ML IJ SOLN
6.2500 mg | INTRAMUSCULAR | Status: DC | PRN
  Administered 2020-07-25: 12.5 mg via INTRAVENOUS

## 2020-07-25 MED ORDER — ALBUTEROL SULFATE (2.5 MG/3ML) 0.083% IN NEBU: 2.5000 mg | INHALATION_SOLUTION | RESPIRATORY_TRACT | Status: DC | PRN

## 2020-07-25 MED ORDER — OXYCODONE HCL 5 MG/5ML PO SOLN: 5.0000 mg | Freq: Once | ORAL | Status: DC | PRN

## 2020-07-25 MED ORDER — OXYCODONE HCL 5 MG PO TABS: 5.0000 mg | ORAL_TABLET | ORAL | Status: DC | PRN

## 2020-07-25 MED ORDER — CHLORHEXIDINE GLUCONATE 0.12 % MT SOLN
OROMUCOSAL | Status: AC
  Administered 2020-07-25: 15 mL via OROMUCOSAL
  Filled 2020-07-25: qty 15

## 2020-07-25 MED ORDER — LACTATED RINGERS IV SOLN: INTRAVENOUS | Status: DC

## 2020-07-25 MED ORDER — CHLORHEXIDINE GLUCONATE CLOTH 2 % EX PADS: 6.0000 | MEDICATED_PAD | Freq: Once | CUTANEOUS | Status: DC

## 2020-07-25 MED ORDER — BISACODYL 5 MG PO TBEC: 5.0000 mg | DELAYED_RELEASE_TABLET | Freq: Every day | ORAL | Status: DC | PRN

## 2020-07-25 MED ORDER — PROPOFOL 10 MG/ML IV BOLUS
INTRAVENOUS | Status: DC | PRN
  Administered 2020-07-25: 120 mg via INTRAVENOUS
  Administered 2020-07-25: 25 ug/kg/min via INTRAVENOUS

## 2020-07-25 MED ORDER — DOCUSATE SODIUM 100 MG PO CAPS
100.0000 mg | ORAL_CAPSULE | Freq: Two times a day (BID) | ORAL | Status: DC
  Administered 2020-07-25 – 2020-07-27 (×4): 100 mg via ORAL
  Filled 2020-07-25 (×4): qty 1

## 2020-07-25 MED ORDER — TIOTROPIUM BROMIDE MONOHYDRATE 2.5 MCG/ACT IN AERS: 2.0000 | INHALATION_SPRAY | Freq: Every day | RESPIRATORY_TRACT | Status: DC

## 2020-07-25 MED ORDER — MORPHINE SULFATE (PF) 2 MG/ML IV SOLN: 2.0000 mg | INTRAVENOUS | Status: DC | PRN

## 2020-07-25 MED ORDER — LIDOCAINE-EPINEPHRINE 0.5 %-1:200000 IJ SOLN
INTRAMUSCULAR | Status: AC
  Filled 2020-07-25: qty 1

## 2020-07-25 MED ORDER — FENTANYL CITRATE (PF) 100 MCG/2ML IJ SOLN
INTRAMUSCULAR | Status: DC | PRN
Start: 2020-07-25 — End: 2020-07-25
  Administered 2020-07-25: 200 ug via INTRAVENOUS
  Administered 2020-07-25: 50 ug via INTRAVENOUS

## 2020-07-25 MED ORDER — 0.9 % SODIUM CHLORIDE (POUR BTL) OPTIME
TOPICAL | Status: DC | PRN
  Administered 2020-07-25: 1000 mL

## 2020-07-25 MED ORDER — GLYCOPYRROLATE 0.2 MG/ML IJ SOLN
INTRAMUSCULAR | Status: DC | PRN
  Administered 2020-07-25: .2 mg via INTRAVENOUS

## 2020-07-25 MED ORDER — OXYCODONE HCL 5 MG PO TABS: 5.0000 mg | ORAL_TABLET | Freq: Once | ORAL | Status: DC | PRN

## 2020-07-25 MED ORDER — ALBUTEROL SULFATE HFA 108 (90 BASE) MCG/ACT IN AERS: 2.0000 | INHALATION_SPRAY | RESPIRATORY_TRACT | Status: DC | PRN

## 2020-07-25 MED ORDER — DIAZEPAM 5 MG PO TABS
5.0000 mg | ORAL_TABLET | Freq: Four times a day (QID) | ORAL | Status: DC | PRN
  Administered 2020-07-26 – 2020-07-27 (×3): 5 mg via ORAL
  Filled 2020-07-25 (×3): qty 1

## 2020-07-25 MED ORDER — PROPOFOL 1000 MG/100ML IV EMUL
INTRAVENOUS | Status: AC
  Filled 2020-07-25: qty 100

## 2020-07-25 MED ORDER — MEPERIDINE HCL 25 MG/ML IJ SOLN
INTRAMUSCULAR | Status: AC
Start: 2020-07-25 — End: 2020-07-26
  Filled 2020-07-25: qty 1

## 2020-07-25 MED ORDER — SENNOSIDES-DOCUSATE SODIUM 8.6-50 MG PO TABS: 1.0000 | ORAL_TABLET | Freq: Every evening | ORAL | Status: DC | PRN

## 2020-07-25 MED ORDER — MOMETASONE FURO-FORMOTEROL FUM 200-5 MCG/ACT IN AERO
2.0000 | INHALATION_SPRAY | Freq: Two times a day (BID) | RESPIRATORY_TRACT | Status: DC
  Administered 2020-07-26 – 2020-07-27 (×2): 2 via RESPIRATORY_TRACT
  Filled 2020-07-25 (×2): qty 8.8

## 2020-07-25 MED ORDER — ONDANSETRON HCL 4 MG/2ML IJ SOLN
INTRAMUSCULAR | Status: DC | PRN
  Administered 2020-07-25 (×2): 4 mg via INTRAVENOUS

## 2020-07-25 MED ORDER — ZOLPIDEM TARTRATE 5 MG PO TABS: 5.0000 mg | ORAL_TABLET | Freq: Every evening | ORAL | Status: DC | PRN

## 2020-07-25 MED ORDER — ORAL CARE MOUTH RINSE: 15.0000 mL | Freq: Once | OROMUCOSAL | Status: AC

## 2020-07-25 MED ORDER — ROSUVASTATIN CALCIUM 20 MG PO TABS: 20.0000 mg | ORAL_TABLET | ORAL | Status: DC

## 2020-07-25 MED ORDER — MENTHOL 3 MG MT LOZG: 1.0000 | LOZENGE | OROMUCOSAL | Status: DC | PRN

## 2020-07-25 MED ORDER — HYDROMORPHONE HCL 1 MG/ML IJ SOLN
0.2500 mg | INTRAMUSCULAR | Status: DC | PRN
  Administered 2020-07-25 (×4): 0.5 mg via INTRAVENOUS

## 2020-07-25 MED ORDER — ACETAMINOPHEN 650 MG RE SUPP: 650.0000 mg | RECTAL | Status: DC | PRN

## 2020-07-25 MED ORDER — KETAMINE HCL 50 MG/5ML IJ SOSY
PREFILLED_SYRINGE | INTRAMUSCULAR | Status: AC
  Filled 2020-07-25: qty 5

## 2020-07-25 MED ORDER — ACETAMINOPHEN 325 MG PO TABS: 650.0000 mg | ORAL_TABLET | ORAL | Status: DC | PRN

## 2020-07-25 MED ORDER — MIDAZOLAM HCL 2 MG/2ML IJ SOLN
INTRAMUSCULAR | Status: AC
  Filled 2020-07-25: qty 2

## 2020-07-25 MED ORDER — THROMBIN 20000 UNITS EX SOLR
CUTANEOUS | Status: DC | PRN
  Administered 2020-07-25: 20 mL via TOPICAL

## 2020-07-25 MED ORDER — SODIUM CHLORIDE 0.9% FLUSH: 3.0000 mL | INTRAVENOUS | Status: DC | PRN

## 2020-07-25 MED ORDER — DEXAMETHASONE SODIUM PHOSPHATE 10 MG/ML IJ SOLN
INTRAMUSCULAR | Status: DC | PRN
  Administered 2020-07-25: 10 mg via INTRAVENOUS

## 2020-07-25 MED ORDER — POTASSIUM CHLORIDE IN NACL 20-0.9 MEQ/L-% IV SOLN
INTRAVENOUS | Status: DC
  Filled 2020-07-25 (×3): qty 1000

## 2020-07-25 MED ORDER — KETAMINE HCL 10 MG/ML IJ SOLN
INTRAMUSCULAR | Status: DC | PRN
  Administered 2020-07-25: 10 mg via INTRAVENOUS
  Administered 2020-07-25: 20 mg via INTRAVENOUS
  Administered 2020-07-25 (×2): 10 mg via INTRAVENOUS

## 2020-07-25 MED ORDER — BUPIVACAINE HCL (PF) 0.5 % IJ SOLN
INTRAMUSCULAR | Status: AC
  Filled 2020-07-25: qty 30

## 2020-07-25 MED ORDER — VANCOMYCIN HCL 1000 MG IV SOLR: INTRAVENOUS | Status: DC | PRN

## 2020-07-25 MED ORDER — POTASSIUM CHLORIDE CRYS ER 20 MEQ PO TBCR: 20.0000 meq | EXTENDED_RELEASE_TABLET | Freq: Every day | ORAL | Status: DC

## 2020-07-25 MED ORDER — MAGNESIUM CITRATE PO SOLN: 1.0000 | Freq: Once | ORAL | Status: DC | PRN

## 2020-07-25 MED ORDER — PREGABALIN 100 MG PO CAPS
200.0000 mg | ORAL_CAPSULE | Freq: Every day | ORAL | Status: DC
  Administered 2020-07-25 – 2020-07-26 (×2): 200 mg via ORAL
  Filled 2020-07-25 (×2): qty 2

## 2020-07-25 MED ORDER — LIDOCAINE 2% (20 MG/ML) 5 ML SYRINGE
INTRAMUSCULAR | Status: DC | PRN
  Administered 2020-07-25: 40 mg via INTRAVENOUS

## 2020-07-25 MED ORDER — ROCURONIUM BROMIDE 10 MG/ML (PF) SYRINGE
PREFILLED_SYRINGE | INTRAVENOUS | Status: DC | PRN
  Administered 2020-07-25: 60 mg via INTRAVENOUS
  Administered 2020-07-25: 40 mg via INTRAVENOUS

## 2020-07-25 MED ORDER — PHENYLEPHRINE HCL-NACL 10-0.9 MG/250ML-% IV SOLN
INTRAVENOUS | Status: DC | PRN
  Administered 2020-07-25: 25 ug/min via INTRAVENOUS

## 2020-07-25 MED ORDER — ACETAMINOPHEN 500 MG PO TABS
1000.0000 mg | ORAL_TABLET | Freq: Once | ORAL | Status: AC
  Administered 2020-07-25: 1000 mg via ORAL
  Filled 2020-07-25: qty 2

## 2020-07-25 MED ORDER — MIDAZOLAM HCL 5 MG/5ML IJ SOLN
INTRAMUSCULAR | Status: DC | PRN
  Administered 2020-07-25: 2 mg via INTRAVENOUS

## 2020-07-25 MED ORDER — FENTANYL CITRATE (PF) 250 MCG/5ML IJ SOLN
INTRAMUSCULAR | Status: AC
  Filled 2020-07-25: qty 5

## 2020-07-25 MED ORDER — HYDROMORPHONE HCL 1 MG/ML IJ SOLN
0.5000 mg | INTRAMUSCULAR | Status: DC | PRN
  Administered 2020-07-25: 0.5 mg via INTRAVENOUS

## 2020-07-25 MED ORDER — PANTOPRAZOLE SODIUM 40 MG PO TBEC
80.0000 mg | DELAYED_RELEASE_TABLET | Freq: Every day | ORAL | Status: DC
  Administered 2020-07-26 – 2020-07-27 (×2): 80 mg via ORAL
  Filled 2020-07-25 (×2): qty 2

## 2020-07-25 MED ORDER — VANCOMYCIN HCL IN DEXTROSE 1-5 GM/200ML-% IV SOLN
1000.0000 mg | Freq: Once | INTRAVENOUS | Status: AC
  Administered 2020-07-25: 1000 mg via INTRAVENOUS
  Filled 2020-07-25: qty 200

## 2020-07-25 MED ORDER — SCOPOLAMINE 1 MG/3DAYS TD PT72
1.0000 | MEDICATED_PATCH | TRANSDERMAL | Status: DC
  Administered 2020-07-25: 1.5 mg via TRANSDERMAL
  Filled 2020-07-25: qty 1

## 2020-07-25 MED ORDER — UMECLIDINIUM BROMIDE 62.5 MCG/INH IN AEPB
1.0000 | INHALATION_SPRAY | Freq: Every day | RESPIRATORY_TRACT | Status: DC
  Filled 2020-07-25 (×2): qty 7

## 2020-07-25 MED ORDER — BUPIVACAINE HCL (PF) 0.5 % IJ SOLN
INTRAMUSCULAR | Status: DC | PRN
  Administered 2020-07-25: 30 mL

## 2020-07-25 MED ORDER — CITALOPRAM HYDROBROMIDE 40 MG PO TABS
40.0000 mg | ORAL_TABLET | Freq: Every day | ORAL | Status: DC
  Administered 2020-07-26 – 2020-07-27 (×2): 40 mg via ORAL
  Filled 2020-07-25 (×2): qty 1

## 2020-07-25 MED ORDER — VANCOMYCIN HCL IN DEXTROSE 1-5 GM/200ML-% IV SOLN
1000.0000 mg | INTRAVENOUS | Status: AC
  Administered 2020-07-25: 1000 mg via INTRAVENOUS
  Filled 2020-07-25: qty 200

## 2020-07-25 MED ORDER — SODIUM CHLORIDE 0.9 % IV SOLN: 250.0000 mL | INTRAVENOUS | Status: DC

## 2020-07-25 MED ORDER — MIDAZOLAM HCL 2 MG/2ML IJ SOLN: 0.5000 mg | Freq: Once | INTRAMUSCULAR | Status: DC | PRN

## 2020-07-25 MED ORDER — LIDOCAINE-EPINEPHRINE 0.5 %-1:200000 IJ SOLN
INTRAMUSCULAR | Status: DC | PRN
  Administered 2020-07-25: 10 mL

## 2020-07-25 MED ORDER — OXYCODONE HCL 5 MG PO TABS
10.0000 mg | ORAL_TABLET | ORAL | Status: DC | PRN
  Administered 2020-07-25 – 2020-07-27 (×12): 10 mg via ORAL
  Filled 2020-07-25 (×12): qty 2

## 2020-07-25 MED ORDER — POTASSIUM CHLORIDE ER 10 MEQ PO TBCR
10.0000 meq | EXTENDED_RELEASE_TABLET | Freq: Two times a day (BID) | ORAL | Status: DC
  Administered 2020-07-25 – 2020-07-27 (×4): 10 meq via ORAL
  Filled 2020-07-25 (×9): qty 1

## 2020-07-25 MED ORDER — PHENOL 1.4 % MT LIQD: 1.0000 | OROMUCOSAL | Status: DC | PRN

## 2020-07-25 SURGICAL SUPPLY — 68 items
BENZOIN TINCTURE PRP APPL 2/3 (GAUZE/BANDAGES/DRESSINGS) IMPLANT
BIT DRILL PLIF MAS DISP 5.5MM (DRILL) ×1 IMPLANT
BLADE CLIPPER SURG (BLADE) IMPLANT
BUR MATCHSTICK NEURO 3.0 LAGG (BURR) ×3 IMPLANT
BUR PRECISION FLUTE 5.0 (BURR) ×3 IMPLANT
CAGE CONVEX CASCADIA 8.5X22X11 (Cage) ×6 IMPLANT
CANISTER SUCT 3000ML PPV (MISCELLANEOUS) ×3 IMPLANT
CAP RELINE MOD TULIP RMM (Cap) ×6 IMPLANT
CARTRIDGE OIL MAESTRO DRILL (MISCELLANEOUS) ×1 IMPLANT
CLOSURE WOUND 1/2 X4 (GAUZE/BANDAGES/DRESSINGS)
CNTNR URN SCR LID CUP LEK RST (MISCELLANEOUS) ×1 IMPLANT
CONT SPEC 4OZ STRL OR WHT (MISCELLANEOUS) ×2
COVER BACK TABLE 60X90IN (DRAPES) ×3 IMPLANT
COVER WAND RF STERILE (DRAPES) ×3 IMPLANT
DECANTER SPIKE VIAL GLASS SM (MISCELLANEOUS) ×3 IMPLANT
DERMABOND ADVANCED (GAUZE/BANDAGES/DRESSINGS) ×2
DERMABOND ADVANCED .7 DNX12 (GAUZE/BANDAGES/DRESSINGS) ×1 IMPLANT
DIFFUSER DRILL AIR PNEUMATIC (MISCELLANEOUS) ×3 IMPLANT
DRAPE C-ARM 42X72 X-RAY (DRAPES) ×3 IMPLANT
DRAPE C-ARMOR (DRAPES) ×3 IMPLANT
DRAPE LAPAROTOMY 100X72X124 (DRAPES) ×3 IMPLANT
DRAPE SURG 17X23 STRL (DRAPES) ×3 IMPLANT
DRILL PLIF MAS DISP 5.5MM (DRILL) ×3
DRSG OPSITE POSTOP 4X6 (GAUZE/BANDAGES/DRESSINGS) ×3 IMPLANT
DURAPREP 26ML APPLICATOR (WOUND CARE) ×3 IMPLANT
ELECT REM PT RETURN 9FT ADLT (ELECTROSURGICAL) ×3
ELECTRODE REM PT RTRN 9FT ADLT (ELECTROSURGICAL) ×1 IMPLANT
GAUZE 4X4 16PLY RFD (DISPOSABLE) IMPLANT
GAUZE SPONGE 4X4 12PLY STRL (GAUZE/BANDAGES/DRESSINGS) IMPLANT
GLOVE BIO SURGEON STRL SZ 6.5 (GLOVE) ×4 IMPLANT
GLOVE BIO SURGEONS STRL SZ 6.5 (GLOVE) ×2
GLOVE BIOGEL PI IND STRL 6.5 (GLOVE) ×2 IMPLANT
GLOVE BIOGEL PI INDICATOR 6.5 (GLOVE) ×4
GLOVE ECLIPSE 6.5 STRL STRAW (GLOVE) IMPLANT
GLOVE EXAM NITRILE XL STR (GLOVE) IMPLANT
GLOVE SURG SS PI 6.5 STRL IVOR (GLOVE) ×6 IMPLANT
GOWN STRL REUS W/ TWL LRG LVL3 (GOWN DISPOSABLE) ×2 IMPLANT
GOWN STRL REUS W/ TWL XL LVL3 (GOWN DISPOSABLE) IMPLANT
GOWN STRL REUS W/TWL 2XL LVL3 (GOWN DISPOSABLE) IMPLANT
GOWN STRL REUS W/TWL LRG LVL3 (GOWN DISPOSABLE) ×4
GOWN STRL REUS W/TWL XL LVL3 (GOWN DISPOSABLE)
KIT BASIN OR (CUSTOM PROCEDURE TRAY) ×3 IMPLANT
KIT POSITION SURG JACKSON T1 (MISCELLANEOUS) ×3 IMPLANT
KIT TURNOVER KIT B (KITS) ×3 IMPLANT
MILL MEDIUM DISP (BLADE) ×3 IMPLANT
NEEDLE HYPO 25X1 1.5 SAFETY (NEEDLE) ×3 IMPLANT
NEEDLE SPNL 18GX3.5 QUINCKE PK (NEEDLE) ×3 IMPLANT
NS IRRIG 1000ML POUR BTL (IV SOLUTION) ×3 IMPLANT
OIL CARTRIDGE MAESTRO DRILL (MISCELLANEOUS) ×3
PACK LAMINECTOMY NEURO (CUSTOM PROCEDURE TRAY) ×3 IMPLANT
PAD ARMBOARD 7.5X6 YLW CONV (MISCELLANEOUS) ×6 IMPLANT
ROD RELINE COCR LORD 5.0X35 (Rod) ×3 IMPLANT
ROD RELINE COCR LORD 5X40MM (Rod) ×3 IMPLANT
SCREW LOCK RSS 4.5/5.0MM (Screw) ×12 IMPLANT
SCREW SHANK RELINE MOD 5.5X35 (Screw) ×3 IMPLANT
SHANK RELINE MOD 5.5X40 (Screw) ×3 IMPLANT
SPONGE LAP 4X18 RFD (DISPOSABLE) IMPLANT
SPONGE SURGIFOAM ABS GEL 100 (HEMOSTASIS) ×3 IMPLANT
STRIP CLOSURE SKIN 1/2X4 (GAUZE/BANDAGES/DRESSINGS) IMPLANT
SUT PROLENE 6 0 BV (SUTURE) IMPLANT
SUT VIC AB 0 CT1 18XCR BRD8 (SUTURE) ×1 IMPLANT
SUT VIC AB 0 CT1 8-18 (SUTURE) ×2
SUT VIC AB 2-0 CT1 18 (SUTURE) ×3 IMPLANT
SUT VIC AB 3-0 SH 8-18 (SUTURE) ×3 IMPLANT
TOWEL GREEN STERILE (TOWEL DISPOSABLE) ×3 IMPLANT
TOWEL GREEN STERILE FF (TOWEL DISPOSABLE) ×3 IMPLANT
TRAY FOLEY MTR SLVR 16FR STAT (SET/KITS/TRAYS/PACK) IMPLANT
WATER STERILE IRR 1000ML POUR (IV SOLUTION) ×3 IMPLANT

## 2020-07-25 NOTE — Anesthesia Procedure Notes (Signed)
Procedure Name: Intubation Date/Time: 07/25/2020 1:50 PM Performed by: Lavell Luster, CRNA Pre-anesthesia Checklist: Patient identified, Emergency Drugs available, Suction available, Patient being monitored and Timeout performed Patient Re-evaluated:Patient Re-evaluated prior to induction Oxygen Delivery Method: Circle system utilized Preoxygenation: Pre-oxygenation with 100% oxygen Induction Type: IV induction Ventilation: Mask ventilation without difficulty Laryngoscope Size: Mac and 3 Grade View: Grade II Tube type: Oral Tube size: 7.5 mm Number of attempts: 1 Airway Equipment and Method: Stylet Placement Confirmation: ETT inserted through vocal cords under direct vision,  positive ETCO2 and breath sounds checked- equal and bilateral Secured at: 22 cm Tube secured with: Tape Dental Injury: Teeth and Oropharynx as per pre-operative assessment

## 2020-07-25 NOTE — Progress Notes (Signed)
Patient's son Richardson Dopp updated via phone [phone].  Appreciative of call.

## 2020-07-25 NOTE — Progress Notes (Signed)
Orthopedic Tech Progress Note Patient Details:  Debra [name] [birth year] [id number] Called order into [facility]. Patient ID: [name], female   DOB: 03/18/1966, 54 y.o.   MRN: [id number]   [name] 07/25/2020, 8:44 PM

## 2020-07-25 NOTE — Transfer of Care (Signed)
Immediate Anesthesia Transfer of Care Note  Patient: Debra [name]  Procedure(s) Performed: Lumbar three-four Posterior lumbar interbody fusion (adjacent level) (N/A )  Patient Location: PACU  Anesthesia Type:General  Level of Consciousness: drowsy  Airway & Oxygen Therapy: Patient Spontanous Breathing and Patient connected to nasal cannula oxygen  Post-op Assessment: Report given to RN and Post -op Vital signs reviewed and stable  Post vital signs: Reviewed and stable  Last Vitals:  Vitals Value Taken Time  BP 134/80 07/25/20 1747  Temp 36.6 C 07/25/20 1745  Pulse 96 07/25/20 1752  Resp 18 07/25/20 1752  SpO2 96 % 07/25/20 1752  Vitals shown include unvalidated device data.  Last Pain:  Vitals:   07/25/20 1745  TempSrc:   PainSc: (P) 9       Patients Stated Pain Goal: 2 (07/25/20 1026)  Complications: No complications documented.

## 2020-07-25 NOTE — Op Note (Signed)
07/25/2020  5:38 PM  PATIENT:  Debra [name]  54 y.o. female With lower extremity pain due to a large synovial cyst, listhesis, and stenosis at L3/4. 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  POST-OPERATIVE DIAGNOSIS:same  PROCEDURE:  Procedure(s): Lumbar three-four Posterior lumbar interbody fusion (adjacent level) with autograft morsels Laminectomy L3 beyond the needed exposure for an L3/4 plif Non segmental pedicle screw fixation L3/4 Nuvasive relign Interbody cages Stryker with autograft morsels  SURGEON:  Surgeon(s): Coletta Memos, MD  ASSISTANTS:Pool, Sherilyn Cooter  ANESTHESIA:   general  EBL:  Total I/O In: 2100 [I.V.:2100] Out: 910 [Urine:535; Blood:375]  BLOOD ADMINISTERED:none  CELL SAVER GIVEN:none  COUNT:per nursing  DRAINS: none   SPECIMEN:  No Specimen  DICTATION: Debra [name] is a 54 y.o. female whom was taken to the operating room intubated, and placed under a general anesthetic without difficulty. A foley catheter was placed under sterile conditions. She was positioned prone on a Jackson table with all pressure points properly padded.  Her lumbar region was prepped and draped in a sterile manner. I infiltrated 10cc's 1/2%lidocaine/1:2000,000 strength epinephrine into the planned incision. I opened the skin with a 10 blade and took the incision down to the thoracolumbar fascia. I exposed the lamina of L3 and L4 in a subperiosteal fashion bilaterally. I confirmed my location with an intraoperative xray.  I placed self retaining retractors and started the decompression.  I decompressed the spinal canal via a near complete L3 laminectomy, and L3 inferior facetectomies, and partial superior L4 facetectomies. I used the drill and Kerrison punches to remove the bone,  And the ligamentum flavum. I opened the foramina with the punches. The L3 and L4 roots were decompressed. PLIF's were  performed at L3/4 in the same fashion. I opened the disc space with a 15 blade then used a variety of instruments to remove the disc and prepare the space for the arthrodesis. I used curettes, rongeurs, punches, shavers for the disc space, and rasps in the discetomy. I measured the disc space and placed 57mm Titanium cages(Stryker) into the disc space(s).  I placed pedicle screws at L3, using fluoroscopic guidance. I drilled a pilot hole, then cannulated the pedicle with a drill at each site. I then tapped each pedicle, assessing each site for pedicle violations. No cutouts were appreciated. Screws Livingston Diones) were then placed at each site without difficulty. I attached rods and locking caps with the appropriate tools. The locking caps were secured with torque limited screwdrivers. Final films were performed and the final construct appeared to be in good position.  We closed the wound in a layered fashion. We approximated the thoracolumbar fascia, subcutaneous, and subcuticular planes with vicryl sutures. I used dermabond and an occlusive dressing for a sterile dressing.     PLAN OF CARE: Admit to inpatient   PATIENT DISPOSITION:  PACU - hemodynamically stable.   Delay start of Pharmacological VTE agent (>24hrs) due to surgical blood loss or risk of bleeding:  yes 07/25/2020

## 2020-07-25 NOTE — H&P (Signed)
BP 140/60   Pulse 95   Temp 98.6 F (37 C) (Oral)   Resp 18   Ht 5\' 6"  (1.676 m)   Wt 76.7 kg   SpO2 94%   BMI 27.28 kg/m      Debra [name] comes in today with an MRI.  What the MRI shows is that she has stenosis and significant facet arthropathy at L3-4 going by the numbering of Radiology.  Essentially, this is a level above the last arthrodesis Allergies  Allergen Reactions  . Cefaclor Diarrhea and Rash    hives  . Sertraline     Suicidal thoughts, insomnia, nightmares  . Amoxicillin-Pot Clavulanate Diarrhea    Has patient had a PCN reaction causing immediate rash, facial/tongue/throat swelling, SOB or lightheadedness with hypotension: No Has patient had a PCN reaction causing severe rash involving mucus membranes or skin necrosis: No Has patient had a PCN reaction that required hospitalization: No Has patient had a PCN reaction occurring within the last 10 years: Yes If all of the above answers are "NO", then may proceed with Cephalosporin use.   . Codeine Nausea And Vomiting    If pt takes a lot of this med  . Latex Itching and Swelling    redness  . Levaquin [Levofloxacin] Swelling    Can take up to 500 mg a day  . Nsaids     Bleeding ulcers  . Ciprofloxacin Nausea Only and Rash    Room was spinning   Past Medical History:  Diagnosis Date  . Anxiety    panic attack  . Arthritis    "lower back" (06/07/2018)- osteo  . Chronic lower back pain   . Complication of anesthesia    anesthesia for neck surgery - had confusion/forgetfulness  . COPD (chronic obstructive pulmonary disease) (HCC)   . Depression   . Dyspnea    with exertion  . GERD (gastroesophageal reflux disease)   . History of bleeding ulcers 1994  . History of hiatal hernia   . History of low potassium   . Pneumonia 2013   Past Surgical History:  Procedure Laterality Date  . ANTERIOR CERVICAL DECOMP/DISCECTOMY FUSION N/A 05/18/2017   Procedure: ACDF - C6-C7;  Surgeon: 05/20/2017, MD;  Location:  [facility] OR;  Service: Neurosurgery;  Laterality: N/A;  . ANTERIOR LUMBAR FUSION  2013   "L-6"  . ESOPHAGOGASTRODUODENOSCOPY N/A 07/14/2017   Procedure: ESOPHAGOGASTRODUODENOSCOPY (EGD);  Surgeon: 07/16/2017, MD;  Location: [facility] ENDOSCOPY;  Service: Endoscopy;  Laterality: N/A;  . POSTERIOR LUMBAR FUSION  06/07/2018   L5-S1  . TONSILLECTOMY    . UPPER GASTROINTESTINAL ENDOSCOPY  1994  . VAGINAL HYSTERECTOMY  1997   Family History  Problem Relation Age of Onset  . Hypertension Mother   . COPD Mother   . Ulcers Father   . Heart disease Father   . Heart attack Paternal Uncle   . Heart attack Paternal Grandfather    Social History   Socioeconomic History  . Marital status: Divorced    Spouse name: Not on file  . Number of children: Not on file  . Years of education: Not on file  . Highest education level: Not on file  Occupational History  . Not on file  Tobacco Use  . Smoking status: Current Every Day Smoker    Packs/day: 0.50    Years: 35.00    Pack years: 17.50    Types: Cigarettes  . Smokeless tobacco: Never Used  Vaping Use  . Vaping Use:  Some days  Substance and Sexual Activity  . Alcohol use: Yes    Comment: 06/07/2018 "might have a beer q 3-4 months"  . Drug use: Not Currently  . Sexual activity: Not Currently  Other Topics Concern  . Not on file  Social History Narrative  . Not on file   Social Determinants of Health   Financial Resource Strain:   . Difficulty of Paying Living Expenses: Not on file  Food Insecurity:   . Worried About Programme researcher, broadcasting/film/video in the Last Year: Not on file  . Ran Out of Food in the Last Year: Not on file  Transportation Needs:   . Lack of Transportation (Medical): Not on file  . Lack of Transportation (Non-Medical): Not on file  Physical Activity:   . Days of Exercise per Week: Not on file  . Minutes of Exercise per Session: Not on file  Stress:   . Feeling of Stress : Not on file  Social Connections:   . Frequency of  Communication with Friends and Family: Not on file  . Frequency of Social Gatherings with Friends and Family: Not on file  . Attends Religious Services: Not on file  . Active Member of Clubs or Organizations: Not on file  . Attends Banker Meetings: Not on file  . Marital Status: Not on file  Intimate Partner Violence:   . Fear of Current or Ex-Partner: Not on file  . Emotionally Abused: Not on file  . Physically Abused: Not on file  . Sexually Abused: Not on file   Prior to Admission medications   Medication Sig Start Date End Date Taking? Authorizing Provider  citalopram (CELEXA) 40 MG tablet Take 40 mg by mouth daily.    Yes [provider]  cyclobenzaprine (FLEXERIL) 10 MG tablet Take 10 mg by mouth every 8 (eight) hours as needed for muscle spasms.  05/21/18  Yes [provider]  diazepam (VALIUM) 5 MG tablet Take 1 tablet (5 mg total) by mouth at bedtime as needed for anxiety. Patient taking differently: Take 5 mg by mouth 4 (four) times daily.  07/14/17  Yes Maxie Barb, MD  DULERA 200-5 MCG/ACT AERO Inhale 2 puffs into the lungs 2 (two) times daily. 03/19/20  Yes Icard, Rachel Bo, DO  omeprazole (PRILOSEC) 40 MG capsule Take 40 mg by mouth daily.    Yes [provider]  oxyCODONE-acetaminophen (PERCOCET) 7.5-325 MG tablet Take 1 tablet by mouth every 6 (six) hours as needed for moderate pain.  05/22/20  Yes [provider]  potassium chloride (KLOR-CON) 10 MEQ tablet Take 10 mEq by mouth in the morning and at bedtime. 06/09/20  Yes [provider]  pregabalin (LYRICA) 100 MG capsule Take 200 mg by mouth at bedtime.    Yes [provider]  PROAIR HFA 108 [phone] Base) MCG/ACT inhaler Inhale 2 puffs into the lungs every 4 (four) hours as needed for wheezing or shortness of breath.  06/19/20  Yes [provider]  rosuvastatin (CRESTOR) 20 MG tablet Take 20 mg by mouth 2 (two) times a week. 06/22/20  Yes [provider]  Tiotropium Bromide Monohydrate (SPIRIVA RESPIMAT) 2.5 MCG/ACT AERS Inhale 2 puffs into the lungs daily. 03/19/20  Yes Icard, Bradley L, DO  fluticasone (FLONASE) 50 MCG/ACT nasal spray Place 1 spray into both nostrils daily as needed for allergies (not using at this time).  02/27/20   [provider]  levofloxacin (LEVAQUIN) 500 MG tablet Take 500  mg by mouth daily. Patient not taking: Reported on 07/21/2020 05/29/20   [provider]  nystatin (MYCOSTATIN) 100000 UNIT/ML suspension Take 5 mLs (500,000 Units total) by mouth 4 (four) times daily. Patient not taking: Reported on 07/21/2020 04/16/20   Audie Box L, DO  potassium chloride SA (K-DUR,KLOR-CON) 20 MEQ tablet Take 1 tablet (20 mEq total) by mouth daily. Patient not taking: Reported on 07/21/2020 07/14/17   Maxie Barb, MD    PHYSICAL EXAMINATION :  Vital Signs:  She weighs 177 pounds.  Blood pressure is 120/77, pulse 98, temperature is 96.6.  Pain is 7/10.  General:  She is stenotic.  The facet arthropathy is significant.  She has a synovial cyst on the left projecting medially into the canal.  I believe this is the reason for her discomfort.     ASSESSMENT AND PLAN :  I do believe a decompression and extension of the hardware and arthrodesis are indicated.  She has had conservative treatment for this for quite some time and nothing has gotten better.  At this point, I would like to book her for surgery due to the listhesis present at 3-4, the stenosis, and neurogenic claudication.  Risks and benefits she is well-versed on, having had the previous operation performed by me in 2019.

## 2020-07-26 NOTE — Progress Notes (Signed)
OT Cancellation Note  Patient Details Name: Debra [name] MRN: [id number] DOB: 25-Jan-1966   Cancelled Treatment:    Reason Eval/Treat Not Completed: Other (comment);Fatigue/lethargy limiting ability to participate; pt reports just ambulated for a second time this AM and just recently returned to bed, wishing to rest at this time. Will follow up for OT eval as able.  Marcy Siren, OT Acute Rehabilitation Services Pager [phone] Office [phone]   Orlando Penner 07/26/2020, 11:54 AM

## 2020-07-26 NOTE — TOC Initial Note (Addendum)
Transition of Care [facility]) - Initial/Assessment Note    Patient Details  Name: Debra [name] MRN: [id number] Date of Birth: [birth year]  [facility] CM/SW Contact:    Annalee Genta, LCSW Phone Number: 07/26/2020, 11:55 AM  Clinical Narrative: CSW contacted by PT for recommendation for home health PT and nursing aide. CSW reviewed and noted patient's insurance provider covers four home health agencies in the area; [facility], [facility], [facility]. CSW coordinated with RNCM for home health options. CSW spoke with patient and received consent for home health referral.  [facility]; No staffing available [facility]; Left voicemail, no response.  [facility]; Requested call back during normal business hours for referral and availability [facility]; Be able to begin providing services 9/1 or 9/2  CSW reviewed this information with patient and noted patient confirmed referral for [facility] and verbalized understanding they will follow-up with her on 9/1 or 9/2. Please contact TOC team for additional discharge supports as needed.                Expected Discharge Plan: Home w Home Health Services Barriers to Discharge: Continued Medical Work up   Patient Goals and CMS Choice Patient states their goals for this hospitalization and ongoing recovery are:: "I want to go home and be able to live. I live alone." CMS Medicare.gov Compare Post Acute Care list provided to:: Patient Choice offered to / list presented to : Patient  Expected Discharge Plan and Services Expected Discharge Plan: Home w Home Health Services In-house Referral: Clinical Social Work   Post Acute Care Choice: Home Health Living arrangements for the past 2 months: Single Family Home                 DME Arranged: N/A         HH Arranged: PT, Nurse's Aide HH Agency: [facility] Date HH Agency Contacted: 07/26/20   Representative spoke with at [facility] Agency: Melissa  Prior Living Arrangements/Services Living  arrangements for the past 2 months: Single Family Home Lives with:: Self Patient language and need for interpreter reviewed:: No Do you feel safe going back to the place where you live?: Yes      Need for Family Participation in Patient Care: No (Comment) Care giver support system in place?: Yes (comment) Current home services: DME Criminal Activity/Legal Involvement Pertinent to Current Situation/Hospitalization: No - Comment as needed  Activities of Daily Living Home Assistive Devices/Equipment: Eyeglasses, Cane (specify quad or straight), Walker (specify type) ADL Screening (condition at time of admission) Patient's cognitive ability adequate to safely complete daily activities?: Yes Is the patient deaf or have difficulty hearing?: No Does the patient have difficulty seeing, even when wearing glasses/contacts?: No Does the patient have difficulty concentrating, remembering, or making decisions?: No Patient able to express need for assistance with ADLs?: Yes Does the patient have difficulty dressing or bathing?: No Independently performs ADLs?: Yes (appropriate for developmental age) Does the patient have difficulty walking or climbing stairs?: Yes Weakness of Legs: Both Weakness of Arms/Hands: None  Permission Sought/Granted Permission sought to share information with : Oceanographer granted to share information with : Yes, Verbal Permission Granted     Permission granted to share info w AGENCY: Home health providers        Emotional Assessment Appearance:: Appears stated age, Well-Groomed Attitude/Demeanor/Rapport: Engaged Affect (typically observed): Calm Orientation: : Oriented to Self, Oriented to Place, Oriented to  Time, Oriented to Situation Alcohol / Substance Use: Not Applicable Psych  Involvement: No (comment)  Admission diagnosis:  Lumbar adjacent segment disease with spondylolisthesis [M51.36, M43.16] Patient Active Problem List    Diagnosis Date Noted  . Lumbar adjacent segment disease with spondylolisthesis 07/25/2020  . Spondylolisthesis of lumbar region 06/07/2018  . Abnormal CT scan, stomach   . Precordial chest pain   . Nausea 07/12/2017  . Epigastric pain 07/12/2017  . GERD (gastroesophageal reflux disease) 07/12/2017  . Elevated troponin 07/12/2017  . Hypokalemia 07/12/2017  . Anxiety 07/12/2017  . Prolonged QT interval 07/12/2017  . HNP (herniated nucleus pulposus), cervical 05/18/2017   PCP:  [name], MD Pharmacy:   [facility] [address], [place] - [address] [place] [address] Phone: [phone] Fax: [phone]     Social Determinants of Health (SDOH) Interventions    Readmission Risk Interventions No flowsheet data found.

## 2020-07-26 NOTE — Evaluation (Addendum)
Occupational Therapy Evaluation Patient Details Name: Debra [name] MRN: [id number] DOB: 1966-05-11 Today's Date: 07/26/2020    History of Present Illness 54 y.o. female with PMH significant for anxiety, chronic low back pain, COPD, depression, DOE, hiatal hernia, and PNA. Pt presents with L3-4 stenosis. Pt underwent L3-4 PLIF on 07/25/2020.   Clinical Impression   This 54 y/o female presents with the above. PTA pt reports being independent with ADL and mobility (using RW recently due to pain). Pt currently requires grossly minguard assist for functional transfers and mobility using RW as well as for ADL tasks. Pt recalling 2/3 back precautions start of session with education provided on all precautions as well as brace management, safety and compensatory strategies for completing ADL and mobility tasks after discharge. Despite education Pt requiring up to mod cues for adherence/carryover of precautions during session today. Pt to benefit from continued acute OT services and currently recommend follow up HHOT services to maximize her safety and independence with ADL and mobility.     Follow Up Recommendations  Home health OT;Supervision - Intermittent    Equipment Recommendations  None recommended by OT           Precautions / Restrictions Precautions Precautions: Fall;Back Precaution Booklet Issued: No Precaution Comments: pt able to recall 2/3 start of session, verbally reviewed/cued throughout  Required Braces or Orthoses: Spinal Brace Spinal Brace: Lumbar corset;Applied in sitting position Restrictions Weight Bearing Restrictions: No      Mobility Bed Mobility Overal bed mobility: Needs Assistance Bed Mobility: Rolling;Sidelying to Sit Rolling: Supervision Sidelying to sit: Supervision       General bed mobility comments: max cues initially to use log roll technique as pt initiating moving to sitting quickly upon arrival to room. returning to sidelying/supine from flat  bed  Transfers Overall transfer level: Needs assistance Equipment used: Rolling walker (2 wheeled) Transfers: Sit to/from Stand Sit to Stand: Min guard         General transfer comment: VCs for safe hand placement     Balance Overall balance assessment: Needs assistance Sitting-balance support: No upper extremity supported;Feet supported Sitting balance-Leahy Scale: Good Sitting balance - Comments: supervision   Standing balance support: Single extremity supported Standing balance-Leahy Scale: Poor Standing balance comment: reliant on unilateral UE support                           ADL either performed or assessed with clinical judgement   ADL Overall ADL's : Needs assistance/impaired Eating/Feeding: Modified independent;Sitting   Grooming: Set up;Sitting   Upper Body Bathing: Supervision/ safety;Sitting   Lower Body Bathing: Min guard;Sit to/from stand Lower Body Bathing Details (indicate cue type and reason): educated to use shower seat and use of figure 4  Upper Body Dressing : Min guard;Sitting Upper Body Dressing Details (indicate cue type and reason): min cues for brace management, educated in wear  Lower Body Dressing: Min guard;Sit to/from stand Lower Body Dressing Details (indicate cue type and reason): for standing balance, pt able to perform figure 4 but will likely require reinforcement for carryover during functional task  Toilet Transfer: Min guard;Ambulation;RW Toilet Transfer Details (indicate cue type and reason): simulated via transfer to / from EOB, room level mobility  Toileting- Clothing Manipulation and Hygiene: Min guard;Sit to/from stand       Functional mobility during ADLs: Min guard;Rolling walker General ADL Comments: pt with decreased recall/understanding of precautions  Pertinent Vitals/Pain Pain Assessment: Faces Faces Pain Scale: Hurts little more Pain Location: back Pain Descriptors /  Indicators: Aching;Discomfort Pain Intervention(s): Monitored during session;Repositioned     Hand Dominance     Extremity/Trunk Assessment Upper Extremity Assessment Upper Extremity Assessment: Overall WFL for tasks assessed   Lower Extremity Assessment Lower Extremity Assessment: Defer to PT evaluation   Cervical / Trunk Assessment Cervical / Trunk Assessment: Other exceptions Cervical / Trunk Exceptions: LSO   Communication Communication Communication: No difficulties   Cognition Arousal/Alertness: Awake/alert Behavior During Therapy: WFL for tasks assessed/performed Overall Cognitive Status: Impaired/Different from baseline Area of Impairment: Safety/judgement;Memory                     Memory: Decreased short-term memory;Decreased recall of precautions   Safety/Judgement: Decreased awareness of safety (reduced awareness of precautions)     General Comments: pt requiring cues to adhere to precautions    General Comments  pt on 3L start of session (reports wears O2 at night), use of RA for room level mobility with SpO2 92%, reapplied 3L end of session     Exercises     Shoulder Instructions      Home Living Family/patient expects to be discharged to:: Private residence Living Arrangements: Alone Available Help at Discharge: Family;Available PRN/intermittently (pt reports very limited assistance from family) Type of Home: Other(Comment) Physicist, medical) Home Access: Stairs to enter [employer] of Steps: 3 Entrance Stairs-Rails: Can reach both Home Layout: One level     Bathroom Shower/Tub: Chief Strategy Officer: Standard     Home Equipment: Environmental consultant - 2 wheels;Cane - single point;Crutches;Shower seat   Additional Comments: pt reports shower seat is currently at her mother's home but can likely get back       Prior Functioning/Environment Level of Independence: Independent        Comments: pt using RW over the past 2 months due  to back and knee pain        OT Problem List: Decreased strength;Decreased range of motion;Decreased activity tolerance;Impaired balance (sitting and/or standing);Decreased safety awareness;Decreased knowledge of use of DME or AE;Decreased knowledge of precautions;Pain      OT Treatment/Interventions: Self-care/ADL training;Therapeutic exercise;Energy conservation;DME and/or AE instruction;Therapeutic activities;Patient/family education;Balance training    OT Goals(Current goals can be found in the care plan section) Acute Rehab OT Goals Patient Stated Goal: To return to independence OT Goal Formulation: With patient Time For Goal Achievement: 08/09/20 Potential to Achieve Goals: Good  OT Frequency: Min 2X/week   Barriers to D/C:            Co-evaluation              AM-PAC OT "6 Clicks" Daily Activity     Outcome Measure Help from another person eating meals?: None Help from another person taking care of personal grooming?: A Little Help from another person toileting, which includes using toliet, bedpan, or urinal?: A Little Help from another person bathing (including washing, rinsing, drying)?: A Little Help from another person to put on and taking off regular upper body clothing?: A Little Help from another person to put on and taking off regular lower body clothing?: A Little 6 Click Score: 19   End of Session Equipment Utilized During Treatment: Back brace;Rolling walker Nurse Communication: Mobility status  Activity Tolerance: Patient tolerated treatment well;Patient limited by pain;Patient limited by fatigue Patient left: in bed;with call bell/phone within reach;with bed alarm set  OT Visit Diagnosis: Other abnormalities of gait  and mobility (R26.89);Pain Pain - part of body:  (back)                Time: 4034-7425 OT Time Calculation (min): 17 min Charges:  OT General Charges $OT Visit: 1 Visit OT Evaluation $OT Eval Low Complexity: 1 Low  Marcy Siren, OT Acute Rehabilitation Services Pager [phone] Office [phone]   Orlando Penner 07/26/2020, 2:55 PM

## 2020-07-26 NOTE — Progress Notes (Signed)
Postop day 1.  Patient doing reasonably well following lumbar decompression and fusion surgery.  Preoperative back and lower extremity pain improved.  Mobilizing with therapy.  No new symptoms of sensory loss and weakness.  Pain is such that she does not feel ready for discharge today.  Awake and alert.  Oriented and appropriate.  Vital signs are stable.  Urine output is good.  Awake and alert.  Oriented and appropriate.  Motor and sensory function intact.  Abdomen soft and benign.  Progressing well following lumbar decompression and fusion.  Plan for discharge hopefully tomorrow.

## 2020-07-26 NOTE — Anesthesia Postprocedure Evaluation (Signed)
Anesthesia Post Note  Patient: Debra [name]  Procedure(s) Performed: Lumbar three-four Posterior lumbar interbody fusion (adjacent level) (N/A )     Patient location during evaluation: PACU Anesthesia Type: General Level of consciousness: awake and alert Pain management: pain level controlled Vital Signs Assessment: post-procedure vital signs reviewed and stable Respiratory status: spontaneous breathing, nonlabored ventilation, respiratory function stable and patient connected to nasal cannula oxygen Cardiovascular status: blood pressure returned to baseline and stable Postop Assessment: no apparent nausea or vomiting Anesthetic complications: no   No complications documented.  Last Vitals:  Vitals:   07/26/20 0412 07/26/20 0819  BP: 95/60 (!) 92/49  Pulse: 89 81  Resp: 18 20  Temp: 36.7 C 36.7 C  SpO2: 92% 96%    Last Pain:  Vitals:   07/26/20 0819  TempSrc: Oral  PainSc:                  Kennieth Rad

## 2020-07-26 NOTE — Evaluation (Signed)
Physical Therapy Evaluation Patient Details Name: Debra [name] MRN: [id number] DOB: Nov 11, 1966 Today's Date: 07/26/2020   History of Present Illness  54 y.o. female with PMH significant for anxiety, chronic low back pain, COPD, depression, DOE, hiatal hernia, and PNA. Pt presents with L3-4 stenosis. Pt underwent L3-4 PLIF on 07/25/2020.  Clinical Impression  Pt presents to PT with deficits in functional mobility, gait, balance, endurance, strength, power, and adherence to precautions. Pt requires frequent cues to maintain back precautions during session and demonstrates limited recall of back precautions this session. Pt requires close supervision due to lack of LE power during transfers, but otherwise mobilizes with supervision at this time. Pt reports she has limited to no assistance available from family and believes she will need a home health aide at the time of discharge to manage a return to home safely. Pt will benefit from acute PT POC to improve mobility quality and adherence to back precautions. PT currently recommends home health PT and intermittent supervision/assistance from family for mobility and ADLs. Pt reports having all necessary DME.    Follow Up Recommendations Home health PT;Supervision for mobility/OOB    Equipment Recommendations  None recommended by PT    Recommendations for Other Services       Precautions / Restrictions Precautions Precautions: Fall;Back Precaution Booklet Issued: No Precaution Comments: PT verbally reviews back precautions, pt able to recall 23 at end of session with verbal cues Required Braces or Orthoses: Spinal Brace Spinal Brace: Lumbar corset (on upon arrival, left on at end of session) Restrictions Weight Bearing Restrictions: No      Mobility  Bed Mobility Overal bed mobility: Needs Assistance Bed Mobility: Rolling;Sidelying to Sit Rolling: Supervision Sidelying to sit: Supervision       General bed mobility comments: pt  requries cues to perform log roll to maintain back precautions  Transfers Overall transfer level: Needs assistance Equipment used: Rolling walker (2 wheeled) Transfers: Sit to/from Stand Sit to Stand: Min guard            Ambulation/Gait Ambulation/Gait assistance: Supervision Gait Distance (Feet): 150 Feet Assistive device: Rolling walker (2 wheeled) Gait Pattern/deviations: Step-through pattern Gait velocity: reduced Gait velocity interpretation: <1.8 ft/sec, indicate of risk for recurrent falls General Gait Details: steady step through gait, no significant losses of balance  Stairs            Wheelchair Mobility    Modified Rankin (Stroke Patients Only)       Balance Overall balance assessment: Needs assistance Sitting-balance support: No upper extremity supported;Feet supported Sitting balance-Leahy Scale: Good Sitting balance - Comments: supervision   Standing balance support: Single extremity supported Standing balance-Leahy Scale: Poor Standing balance comment: reliant on unilateral UE support                             Pertinent Vitals/Pain Pain Assessment: 0-10 Pain Score: 7  Pain Location: back Pain Descriptors / Indicators: Aching Pain Intervention(s): Monitored during session    Home Living Family/patient expects to be discharged to:: Private residence Living Arrangements: Alone Available Help at Discharge: Family;Available PRN/intermittently (pt reports very limited assistance from family) Type of Home: Other(Comment) Physicist, medical) Home Access: Stairs to enter Entrance Stairs-Rails: Can reach both Entrance Stairs-Number of Steps: 3 Home Layout: One level Home Equipment: Walker - 2 wheels;Cane - single point;Crutches;Shower seat      Prior Function Level of Independence: Independent         Comments: pt using  RW over the past 2 months due to back and knee pain     Hand Dominance        Extremity/Trunk Assessment    Upper Extremity Assessment Upper Extremity Assessment: Overall WFL for tasks assessed    Lower Extremity Assessment Lower Extremity Assessment: Generalized weakness    Cervical / Trunk Assessment Cervical / Trunk Assessment: Other exceptions Cervical / Trunk Exceptions: LSO  Communication   Communication: No difficulties  Cognition Arousal/Alertness: Awake/alert Behavior During Therapy: WFL for tasks assessed/performed Overall Cognitive Status: Impaired/Different from baseline Area of Impairment: Orientation;Safety/judgement                 Orientation Level: Disoriented to;Time (reports it is august 25th)       Safety/Judgement:  (reduced awareness of precautions)            General Comments General comments (skin integrity, edema, etc.): pt on 4L [place] upon arrival, pt reports wearing 3L [place] at night but none during day. Pt sats in high 80s, 87-89% on RA during mobility this session, returned pt to 3L at end of session    Exercises     Assessment/Plan    PT Assessment Patient needs continued PT services  PT Problem List Decreased strength;Decreased activity tolerance;Decreased balance;Decreased mobility;Decreased knowledge of precautions;Decreased safety awareness;Decreased knowledge of use of DME;Pain       PT Treatment Interventions DME instruction;Gait training;Stair training;Functional mobility training;Therapeutic activities;Therapeutic exercise;Balance training;Neuromuscular re-education;Patient/family education    PT Goals (Current goals can be found in the Care Plan section)  Acute Rehab PT Goals Patient Stated Goal: To return to independence PT Goal Formulation: With patient Time For Goal Achievement: 08/09/20 Potential to Achieve Goals: Good    Frequency Min 5X/week   Barriers to discharge Decreased caregiver support      Co-evaluation               AM-PAC PT "6 Clicks" Mobility  Outcome Measure Help needed turning from your back to  your side while in a flat bed without using bedrails?: None Help needed moving from lying on your back to sitting on the side of a flat bed without using bedrails?: None Help needed moving to and from a bed to a chair (including a wheelchair)?: A Little Help needed standing up from a chair using your arms (e.g., wheelchair or bedside chair)?: A Little Help needed to walk in hospital room?: None Help needed climbing 3-5 steps with a railing? : A Little 6 Click Score: 21    End of Session Equipment Utilized During Treatment: Back brace Activity Tolerance: Patient tolerated treatment well Patient left: in chair;with call bell/phone within reach;with chair alarm set Nurse Communication: Mobility status PT Visit Diagnosis: Other abnormalities of gait and mobility (R26.89);Muscle weakness (generalized) (M62.81);Pain Pain - part of body:  (back)    Time: 4742-5956 PT Time Calculation (min) (ACUTE ONLY): 35 min   Charges:   PT Evaluation $PT Eval Low Complexity: 1 Low PT Treatments $Gait Training: 8-22 mins        Arlyss Gandy, PT, DPT Acute Rehabilitation Pager: [phone]   Arlyss Gandy 07/26/2020, 10:51 AM

## 2020-07-27 MED ORDER — DIAZEPAM 5 MG PO TABS: 5.0000 mg | ORAL_TABLET | Freq: Four times a day (QID) | ORAL | 0 refills | Status: DC | PRN

## 2020-07-27 MED ORDER — OXYCODONE HCL ER 15 MG PO T12A
15.0000 mg | EXTENDED_RELEASE_TABLET | Freq: Two times a day (BID) | ORAL | 0 refills | Status: DC
Start: 2020-07-27 — End: 2020-11-23

## 2020-07-27 MED ORDER — DOCUSATE SODIUM 100 MG PO CAPS: 100.0000 mg | ORAL_CAPSULE | Freq: Two times a day (BID) | ORAL | 0 refills | Status: DC

## 2020-07-27 MED ORDER — OXYCODONE HCL 10 MG PO TABS: 10.0000 mg | ORAL_TABLET | ORAL | 0 refills | Status: DC | PRN

## 2020-07-27 NOTE — Progress Notes (Signed)
Occupational Therapy Treatment Patient Details Name: Debra [name] MRN: [id number] DOB: 1966-07-24 Today's Date: 07/27/2020    History of present illness 54 y.o. female with PMH significant for anxiety, chronic low back pain, COPD, depression, DOE, hiatal hernia, and PNA. Pt presents with L3-4 stenosis. Pt underwent L3-4 PLIF on 07/25/2020.   OT comments  Pt. Seen for skilled OT.  Able to complete bed mobility, donning of back brace, cont. Education and implementation of back precautions, and simulated toileting and tub transfers in b.room.  Pt. At min guard a for most tasks requiring intermittent cues for sequencing and slowing pace.  Eager for home and reports assistance available from family.  Follow Up Recommendations  Home health OT;Supervision - Intermittent    Equipment Recommendations  None recommended by OT    Recommendations for Other Services      Precautions / Restrictions Precautions Precautions: Fall;Back Precaution Comments: pt able to recall 3/3 start of session, verbally reviewed/cued throughout Required Braces or Orthoses: Spinal Brace Spinal Brace: Lumbar corset;Applied in sitting position Restrictions Weight Bearing Restrictions: No       Mobility Bed Mobility Overal bed mobility: Needs Assistance Bed Mobility: Rolling;Sidelying to Sit;Sit to Sidelying Rolling: Supervision Sidelying to sit: Supervision     Sit to sidelying: Supervision General bed mobility comments: cues for log roll for in and out of bed. pt. impulsive and trying to get in/out before i could adj. rails, settings, and blankets.  once initiated with log roll demonstrated good tech. both in and out. cues to slow pace overall  Transfers Overall transfer level: Needs assistance Equipment used: None Transfers: Sit to/from [employer] Sit to Stand: Min guard Stand pivot transfers: Min guard       General transfer comment: states she did not need rw and wanted to ambulate  without it. states she knows when she needs it and when she does not. no lob noted. reports intermittent furniture walking at home. reviewed only holding onto sturdy strong items    Balance                                           ADL either performed or assessed with clinical judgement   ADL Overall ADL's : Needs assistance/impaired                 Upper Body Dressing : Set up;Sitting Upper Body Dressing Details (indicate cue type and reason): able to don brace without physical assistance-good technique and placement demonstrated     Toilet Transfer: Min guard;Ambulation;RW Toilet Transfer Details (indicate cue type and reason): amb. into b.room but declined need for acutal use     Tub/ Shower Transfer: Tub transfer;Rolling walker;Min guard Tub/Shower Transfer Details (indicate cue type and reason): simulated tub transfer in b.room with reports of L faucet and no grab bars. able to clear est. height of her tub with good technique. educated on hand placement for safe in/out Functional mobility during ADLs: Min guard;Rolling walker General ADL Comments: able to verbalize 3/3 precautions, cont. education on importance of maintaining them during functional tasks. pt. reports previous sx. and knowing what to do, states the issue is not her precautions but her drainage site. states "its draining and i didnt do anything to cause it".  Pt. Cont. To state this in connection to our review of back precautions with her understanding that she had "caused" the drainage  from not adhering to precautions. I  explained drainage is normal and we would have nursing check it. reviewed multiple times the importance of back precaution comlaince is for proper healing and aiding with pain management which is also a major concern of hers.  we were simply focused on her safety, proper healing, and pain management during functional mobility ie: following the precautions.  Pt. Verbalized  understanding after my explanation and rationale.  I also reassured her I would have her rn check her site and dressing when she brought requested meds.   reports she will be sleeping in recliner/couch at home and will have family to assist.     Vision       Perception     Praxis      Cognition Arousal/Alertness: Awake/alert Behavior During Therapy: [facility] for tasks assessed/performed;Impulsive                                   General Comments: pt requiring cues to adhere to precautions, moving fast even with cues to wait for therapist asst. ie: attempting oob after i had asked her to wait a moment while i organized lines and adj. bed positioning.  asked to wait for placement of gait belt. pt. stood up and was trying to walk        Exercises     Shoulder Instructions       General Comments      Pertinent Vitals/ Pain       Pain Assessment: 0-10 Pain Score: 8  Pain Location: back Pain Descriptors / Indicators: Aching;Discomfort Pain Intervention(s): Limited activity within patient's tolerance;Monitored during session;Patient requesting pain meds-RN notified  Home Living                                          Prior Functioning/Environment              Frequency  Min 2X/week        Progress Toward Goals  OT Goals(current goals can now be found in the care plan section)  Progress towards OT goals: Progressing toward goals     Plan      Co-evaluation                 AM-PAC OT "6 Clicks" Daily Activity     Outcome Measure   Help from another person eating meals?: None Help from another person taking care of personal grooming?: A Little Help from another person toileting, which includes using toliet, bedpan, or urinal?: A Little Help from another person bathing (including washing, rinsing, drying)?: A Little Help from another person to put on and taking off regular upper body clothing?: A Little Help from another  person to put on and taking off regular lower body clothing?: A Little 6 Click Score: 19    End of Session Equipment Utilized During Treatment: Back brace;Gait belt  OT Visit Diagnosis: Other abnormalities of gait and mobility (R26.89);Pain   Activity Tolerance Patient tolerated treatment well   Patient Left in bed;with call bell/phone within reach;with bed alarm set   Nurse Communication Patient requests pain meds;Other (comment) (reviewed with RN pts. concerns about her dressing on her back and how often it needs to be and would be changed, also her requests for pain meds)  Time: 3532-9924 OT Time Calculation (min): 13 min  Charges: OT General Charges $OT Visit: 1 Visit OT Treatments $Self Care/Home Management : 8-22 mins  Boneta Lucks, COTA/L Acute Rehabilitation [phone]   Robet Leu 07/27/2020, 11:37 AM

## 2020-07-27 NOTE — Discharge Instructions (Signed)
Wound Care Keep incision covered and dry for two days.    Do not put any creams, lotions, or ointments on incision. Leave steri-strips on back.  They will fall off by themselves. Activity Walk each and every day, increasing distance each day. No lifting greater than 5 lbs.  No driving for 2 weeks; may ride as a passenger locally.  Diet Resume your normal diet.  Return to Work Will be discussed at your follow up appointment. Call Your Doctor If Any of These Occur Redness, drainage, or swelling at the wound.  Temperature greater than 101 degrees. Severe pain not relieved by pain medication. Incision starts to come apart. Follow Up Appt Call today for appointment in 1-2 weeks ([phone]) or for problems.  If you have any hardware placed in your spine, you will need an x-ray before your appointment.  

## 2020-07-27 NOTE — Progress Notes (Addendum)
Pt's ride here, pt discharged via wheelchair with belongings.  Pt provided with gauze dressings and hypafix tape to continue dressing changes at home if needed.  Upper incision area  seeping serosanguinous drainage. Incision dressing was changed at 1530 with new honeycomb dsg.  Dressing had moderate amount of drainage at 1730, pt wanted changed again before discharged. Gauze and hypafix tape used for dressing change before discharge.  Pt will have her son assist in changing dressing if needed at home she states.   Pt escorted by unit NT.

## 2020-07-27 NOTE — Discharge Summary (Signed)
Physician Discharge Summary     Providing Compassionate, Quality Care - Together   Patient ID: Debra [name] MRN: [id number] DOB/AGE: 1965-12-13 54 y.o.  Admit date: 07/25/2020 Discharge date: 07/27/2020  Admission Diagnoses: Lumbar adjacent segment disease with spondylolisthesis  Discharge Diagnoses:  Active Problems:   Lumbar adjacent segment disease with spondylolisthesis   Discharged Condition: good  Hospital Course: Patient underwent an L3-4 posterior lumbar interbody fusion by Dr. Franky Macho on 07/25/2020. She was admitted to 4NP13 following recovery from anesthesia in the PACU. Her postoperative course has been uncomplicated. She has worked with both physical and occupational therapies who are recommending Home Health PT and OT at discharge. She is ambulating independently and without difficulty. She is tolerating a normal diet. She is not having any bowel or bladder dysfunction. Her pain is well-controlled with oral pain medication. She is ready for discharge home.   Consults: rehabilitation medicine  Significant Diagnostic Studies: radiology: DG Lumbar Spine 2-3 Views  Result Date: 07/25/2020 CLINICAL DATA:  PLIF L3-L4. EXAM: LUMBAR SPINE - 2-3 VIEW; DG C-ARM 1-60 MIN COMPARISON:  Preoperative radiograph 07/02/2019, 01/01/2019. Lumbar MRI 05/13/2020 FINDINGS: Six fluoroscopic spot views of the lumbar spine in frontal and lateral projections. Previous fusion in the lower lumbar spine and lumbosacral junction, reported 6 non-rib-bearing lumbar vertebra on prior MRI. Initial image demonstrates surgical instrument localizing posterior to L4 (assuming S1 is transitional). Subsequent pedicle screw placement. Total fluoroscopy time 39.4 seconds. Total dose 26.25 mGy. IMPRESSION: Intraoperative fluoroscopy during lumbar spine surgery. Electronically Signed   By: Narda Rutherford M.D.   On: 07/25/2020 17:36   DG C-Arm 1-60 Min  Result Date: 07/25/2020 CLINICAL DATA:  PLIF L3-L4. EXAM:  LUMBAR SPINE - 2-3 VIEW; DG C-ARM 1-60 MIN COMPARISON:  Preoperative radiograph 07/02/2019, 01/01/2019. Lumbar MRI 05/13/2020 FINDINGS: Six fluoroscopic spot views of the lumbar spine in frontal and lateral projections. Previous fusion in the lower lumbar spine and lumbosacral junction, reported 6 non-rib-bearing lumbar vertebra on prior MRI. Initial image demonstrates surgical instrument localizing posterior to L4 (assuming S1 is transitional). Subsequent pedicle screw placement. Total fluoroscopy time 39.4 seconds. Total dose 26.25 mGy. IMPRESSION: Intraoperative fluoroscopy during lumbar spine surgery. Electronically Signed   By: Narda Rutherford M.D.   On: 07/25/2020 17:36     Treatments: surgery:  1) Lumbar three-four Posterior lumbar interbody fusion (adjacent level) with autograft morsels 2) Laminectomy L3 beyond the needed exposure for an L3/4 plif 3) Non segmental pedicle screw fixation L3/4 Nuvasive relign Interbody cages [employer] with autograft morsels  Discharge Exam: Blood pressure 112/69, pulse 80, temperature 98.5 F (36.9 C), resp. rate 16, height 5\' 6"  (1.676 m), weight 76.7 kg, SpO2 94 %.   Alert and oriented x 4 PERRLA CN II-XII grossly intact MAE, Strength and sensation intact Incision is covered with Honeycomb dressing and Steri Strips; Dressing with old drainage. Dressing will be changed prior to discharge.  Disposition: Discharge disposition: 01-Home or Self Care       Discharge Instructions    Face-to-face encounter (required for Medicare/Medicaid patients)   Complete by: As directed    I certify that this patient is under my care and that I, or a nurse practitioner or physician's assistant working with me, had a face-to-face encounter that meets the physician face-to-face encounter requirements with this patient on 07/27/2020. The encounter with the patient was in whole, or in part for the following medical condition(s) which is the primary reason  for home health care (List medical condition): Lumbar spinal  stenosis with neurogenic claudication, s/p L3-4 PLIF   The encounter with the patient was in whole, or in part, for the following medical condition, which is the primary reason for home health care: Lumbar spinal stenosis with neurogenic claudication, s/p L3-4 PLIF   I certify that, based on my findings, the following services are medically necessary home health services: Physical therapy   Reason for Medically Necessary Home Health Services: Therapy- Therapeutic Exercises to Increase Strength and Endurance   My clinical findings support the need for the above services: Unable to leave home safely without assistance and/or assistive device   Further, I certify that my clinical findings support that this patient is homebound due to: Unable to leave home safely without assistance   Home Health   Complete by: As directed    To provide the following care/treatments:  PT OT       Allergies as of 07/27/2020      Reactions   Cefaclor Diarrhea, Rash   hives   Sertraline    Suicidal thoughts, insomnia, nightmares   Amoxicillin-pot Clavulanate Diarrhea   Has patient had a PCN reaction causing immediate rash, facial/tongue/throat swelling, SOB or lightheadedness with hypotension: No Has patient had a PCN reaction causing severe rash involving mucus membranes or skin necrosis: No Has patient had a PCN reaction that required hospitalization: No Has patient had a PCN reaction occurring within the last 10 years: Yes If all of the above answers are "NO", then may proceed with Cephalosporin use.   Codeine Nausea And Vomiting   If pt takes a lot of this med   Latex Itching, Swelling   redness   Levaquin [levofloxacin] Swelling   Can take up to 500 mg a day   Nsaids    Bleeding ulcers   Ciprofloxacin Nausea Only, Rash   Room was spinning      Medication List    STOP taking these medications   cyclobenzaprine 10 MG tablet Commonly known  as: FLEXERIL   Dulera 200-5 MCG/ACT Aero Generic drug: mometasone-formoterol   fluticasone 50 MCG/ACT nasal spray Commonly known as: FLONASE   levofloxacin 500 MG tablet Commonly known as: LEVAQUIN   nystatin 100000 UNIT/ML suspension Commonly known as: MYCOSTATIN   oxyCODONE-acetaminophen 7.5-325 MG tablet Commonly known as: PERCOCET   potassium chloride SA 20 MEQ tablet Commonly known as: KLOR-CON     TAKE these medications   citalopram 40 MG tablet Commonly known as: CELEXA Take 40 mg by mouth daily.   diazepam 5 MG tablet Commonly known as: VALIUM Take 1 tablet (5 mg total) by mouth every 6 (six) hours as needed for muscle spasms. What changed:   when to take this  reasons to take this   docusate sodium 100 MG capsule Commonly known as: COLACE Take 1 capsule (100 mg total) by mouth 2 (two) times daily.   omeprazole 40 MG capsule Commonly known as: PRILOSEC Take 40 mg by mouth daily.   oxyCODONE 15 mg 12 hr tablet Commonly known as: OXYCONTIN Take 1 tablet (15 mg total) by mouth every 12 (twelve) hours. For post operative pain   Oxycodone HCl 10 MG Tabs Take 1 tablet (10 mg total) by mouth every 4 (four) hours as needed for severe pain ((score 7 to 10) postoperative pain).   potassium chloride 10 MEQ tablet Commonly known as: KLOR-CON Take 10 mEq by mouth in the morning and at bedtime.   pregabalin 100 MG capsule Commonly known as: LYRICA Take 200 mg by mouth  at bedtime.   ProAir HFA 108 (90 Base) MCG/ACT inhaler Generic drug: albuterol Inhale 2 puffs into the lungs every 4 (four) hours as needed for wheezing or shortness of breath.   rosuvastatin 20 MG tablet Commonly known as: CRESTOR Take 20 mg by mouth 2 (two) times a week.   Spiriva Respimat 2.5 MCG/ACT Aers Generic drug: Tiotropium Bromide Monohydrate Inhale 2 puffs into the lungs daily.       Follow-up Information    [facility]   Specialty: Home Health  Services Why: [phone].  This agency will call you to arrange therapy appointments starting Wednesday or Thursday.       Coletta Memos, MD. Schedule an appointment as soon as possible for a visit in 2 week(s).   Specialty: Neurosurgery Contact information: [address] [place] [address] [phone]               Signed: Floreen Comber 07/27/2020, 10:41 AM

## 2020-07-27 NOTE — Progress Notes (Signed)
Discharge instructions reviewed with pt.  Copy of instructions given to pt, scripts sent to pt's pharmacy by MD, pt informed.     Pt waiting for family to arrive.

## 2020-07-27 NOTE — Progress Notes (Signed)
PT Cancellation Note  Patient Details Name: Debra [name] MRN: [id number] DOB: Apr 28, 1966   Cancelled Treatment:    Reason Eval/Treat Not Completed: Patient declined, no reason specified. PT attempts to see pt for treatment, pt is preparing for discharge and reports she has been moving frequently throughout the day. Pt denies concerns about mobility for discharge at this time and declines PT intervention. PT will continue to follow until discharge is complete.   Arlyss Gandy 07/27/2020, 4:11 PM

## 2020-07-28 LAB — TYPE AND SCREEN
ABO/RH(D): O POS
Antibody Screen: NEGATIVE
Unit division: 0
Unit division: 0

## 2020-07-28 LAB — BPAM RBC
Blood Product Expiration Date: 202109262359
Blood Product Expiration Date: 202109262359
Unit Type and Rh: 5100
Unit Type and Rh: 5100

## 2020-11-22 ENCOUNTER — Inpatient Hospital Stay (HOSPITAL_COMMUNITY)
Admission: EM | Admit: 2020-11-22 | Discharge: 2020-12-05 | DRG: 193 | Disposition: A | Discharge status: Skilled Nursing Facility | Payer: Medicaid Other | Attending: Family Medicine | Admitting: Family Medicine

## 2020-11-22 ENCOUNTER — Encounter (HOSPITAL_COMMUNITY): Payer: Self-pay | Admitting: *Deleted

## 2020-11-22 ENCOUNTER — Other Ambulatory Visit: Payer: Self-pay

## 2020-11-22 ENCOUNTER — Emergency Department (HOSPITAL_COMMUNITY): Payer: Medicaid Other

## 2020-11-22 DIAGNOSIS — M545 Low back pain, unspecified: Secondary | ICD-10-CM | POA: Diagnosis present

## 2020-11-22 DIAGNOSIS — Z888 Allergy status to other drugs, medicaments and biological substances status: Secondary | ICD-10-CM

## 2020-11-22 DIAGNOSIS — Z720 Tobacco use: Secondary | ICD-10-CM | POA: Diagnosis present

## 2020-11-22 DIAGNOSIS — Z880 Allergy status to penicillin: Secondary | ICD-10-CM

## 2020-11-22 DIAGNOSIS — J9601 Acute respiratory failure with hypoxia: Secondary | ICD-10-CM

## 2020-11-22 DIAGNOSIS — E441 Mild protein-calorie malnutrition: Secondary | ICD-10-CM | POA: Diagnosis present

## 2020-11-22 DIAGNOSIS — J441 Chronic obstructive pulmonary disease with (acute) exacerbation: Secondary | ICD-10-CM | POA: Diagnosis present

## 2020-11-22 DIAGNOSIS — R0600 Dyspnea, unspecified: Secondary | ICD-10-CM

## 2020-11-22 DIAGNOSIS — Z886 Allergy status to analgesic agent status: Secondary | ICD-10-CM

## 2020-11-22 DIAGNOSIS — F32A Depression, unspecified: Secondary | ICD-10-CM | POA: Diagnosis present

## 2020-11-22 DIAGNOSIS — J181 Lobar pneumonia, unspecified organism: Principal | ICD-10-CM

## 2020-11-22 DIAGNOSIS — Z8249 Family history of ischemic heart disease and other diseases of the circulatory system: Secondary | ICD-10-CM

## 2020-11-22 DIAGNOSIS — D649 Anemia, unspecified: Secondary | ICD-10-CM | POA: Diagnosis present

## 2020-11-22 DIAGNOSIS — K219 Gastro-esophageal reflux disease without esophagitis: Secondary | ICD-10-CM | POA: Diagnosis present

## 2020-11-22 DIAGNOSIS — Z885 Allergy status to narcotic agent status: Secondary | ICD-10-CM

## 2020-11-22 DIAGNOSIS — Z6823 Body mass index (BMI) 23.0-23.9, adult: Secondary | ICD-10-CM

## 2020-11-22 DIAGNOSIS — D75839 Thrombocytosis, unspecified: Secondary | ICD-10-CM | POA: Diagnosis not present

## 2020-11-22 DIAGNOSIS — F17210 Nicotine dependence, cigarettes, uncomplicated: Secondary | ICD-10-CM | POA: Diagnosis present

## 2020-11-22 DIAGNOSIS — M1990 Unspecified osteoarthritis, unspecified site: Secondary | ICD-10-CM | POA: Diagnosis present

## 2020-11-22 DIAGNOSIS — Z8711 Personal history of peptic ulcer disease: Secondary | ICD-10-CM

## 2020-11-22 DIAGNOSIS — F410 Panic disorder [episodic paroxysmal anxiety] without agoraphobia: Secondary | ICD-10-CM | POA: Diagnosis present

## 2020-11-22 DIAGNOSIS — Z9981 Dependence on supplemental oxygen: Secondary | ICD-10-CM

## 2020-11-22 DIAGNOSIS — E785 Hyperlipidemia, unspecified: Secondary | ICD-10-CM | POA: Diagnosis present

## 2020-11-22 DIAGNOSIS — R911 Solitary pulmonary nodule: Secondary | ICD-10-CM | POA: Diagnosis present

## 2020-11-22 DIAGNOSIS — Z825 Family history of asthma and other chronic lower respiratory diseases: Secondary | ICD-10-CM

## 2020-11-22 DIAGNOSIS — Z20822 Contact with and (suspected) exposure to covid-19: Secondary | ICD-10-CM | POA: Diagnosis present

## 2020-11-22 DIAGNOSIS — B009 Herpesviral infection, unspecified: Secondary | ICD-10-CM | POA: Diagnosis not present

## 2020-11-22 DIAGNOSIS — Z881 Allergy status to other antibiotic agents status: Secondary | ICD-10-CM

## 2020-11-22 DIAGNOSIS — E876 Hypokalemia: Secondary | ICD-10-CM | POA: Diagnosis present

## 2020-11-22 DIAGNOSIS — T380X5A Adverse effect of glucocorticoids and synthetic analogues, initial encounter: Secondary | ICD-10-CM | POA: Diagnosis not present

## 2020-11-22 DIAGNOSIS — J189 Pneumonia, unspecified organism: Secondary | ICD-10-CM | POA: Diagnosis present

## 2020-11-22 DIAGNOSIS — J440 Chronic obstructive pulmonary disease with acute lower respiratory infection: Secondary | ICD-10-CM | POA: Diagnosis present

## 2020-11-22 DIAGNOSIS — J9621 Acute and chronic respiratory failure with hypoxia: Secondary | ICD-10-CM | POA: Diagnosis present

## 2020-11-22 DIAGNOSIS — Z981 Arthrodesis status: Secondary | ICD-10-CM

## 2020-11-22 DIAGNOSIS — Z91040 Latex allergy status: Secondary | ICD-10-CM

## 2020-11-22 DIAGNOSIS — G894 Chronic pain syndrome: Secondary | ICD-10-CM | POA: Diagnosis present

## 2020-11-22 DIAGNOSIS — Z9114 Patient's other noncompliance with medication regimen: Secondary | ICD-10-CM

## 2020-11-22 LAB — LACTATE DEHYDROGENASE: LDH: 270 U/L — ABNORMAL HIGH (ref 98–192)

## 2020-11-22 LAB — COMPREHENSIVE METABOLIC PANEL
ALT: 28 U/L (ref 0–44)
AST: 31 U/L (ref 15–41)
Albumin: 3 g/dL — ABNORMAL LOW (ref 3.5–5.0)
Alkaline Phosphatase: 130 U/L — ABNORMAL HIGH (ref 38–126)
Anion gap: 10 (ref 5–15)
BUN: 6 mg/dL (ref 6–20)
CO2: 27 mmol/L (ref 22–32)
Calcium: 7.5 mg/dL — ABNORMAL LOW (ref 8.9–10.3)
Chloride: 98 mmol/L (ref 98–111)
Creatinine, Ser: 0.69 mg/dL (ref 0.44–1.00)
GFR, Estimated: >60 mL/min (ref >60)
Glucose, Bld: 121 mg/dL — ABNORMAL HIGH (ref 70–99)
Potassium: 2.8 mmol/L — ABNORMAL LOW (ref 3.5–5.1)
Sodium: 135 mmol/L (ref 135–145)
Total Bilirubin: 0.5 mg/dL (ref 0.3–1.2)
Total Protein: 6.3 g/dL — ABNORMAL LOW (ref 6.5–8.1)

## 2020-11-22 LAB — CBC WITH DIFFERENTIAL/PLATELET
Abs Immature Granulocytes: 0.14 10*3/uL — ABNORMAL HIGH (ref 0.00–0.07)
Basophils Absolute: 0 10*3/uL (ref 0.0–0.1)
Basophils Relative: 0 %
Eosinophils Absolute: 0 10*3/uL (ref 0.0–0.5)
Eosinophils Relative: 0 %
HCT: 33.2 % — ABNORMAL LOW (ref 36.0–46.0)
Hemoglobin: 10 g/dL — ABNORMAL LOW (ref 12.0–15.0)
Immature Granulocytes: 2 %
Lymphocytes Relative: 9 %
Lymphs Abs: 0.8 10*3/uL (ref 0.7–4.0)
MCH: 27.3 pg (ref 26.0–34.0)
MCHC: 30.1 g/dL (ref 30.0–36.0)
MCV: 90.7 fL (ref 80.0–100.0)
Monocytes Absolute: 0.4 10*3/uL (ref 0.1–1.0)
Monocytes Relative: 4 %
Neutro Abs: 8 10*3/uL — ABNORMAL HIGH (ref 1.7–7.7)
Neutrophils Relative %: 85 %
Platelets: 243 10*3/uL (ref 150–400)
RBC: 3.66 MIL/uL — ABNORMAL LOW (ref 3.87–5.11)
RDW: 17.2 % — ABNORMAL HIGH (ref 11.5–15.5)
WBC: 9.4 10*3/uL (ref 4.0–10.5)
nRBC: 0 % (ref 0.0–0.2)

## 2020-11-22 LAB — FERRITIN: Ferritin: 84 ng/mL (ref 11–307)

## 2020-11-22 LAB — C-REACTIVE PROTEIN: CRP: 45.2 mg/dL — ABNORMAL HIGH (ref <1.0)

## 2020-11-22 LAB — LACTIC ACID, PLASMA: Lactic Acid, Venous: 1.5 mmol/L (ref 0.5–1.9)

## 2020-11-22 LAB — TRIGLYCERIDES: Triglycerides: 84 mg/dL (ref <150)

## 2020-11-22 LAB — RESP PANEL BY RT-PCR (FLU A&B, COVID) ARPGX2
Influenza A by PCR: NEGATIVE
Influenza B by PCR: NEGATIVE
SARS Coronavirus 2 by RT PCR: NEGATIVE

## 2020-11-22 LAB — PROCALCITONIN: Procalcitonin: 4.45 ng/mL

## 2020-11-22 MED ORDER — ACETAMINOPHEN 500 MG PO TABS
1000.0000 mg | ORAL_TABLET | Freq: Once | ORAL | Status: AC
  Administered 2020-11-23: 01:00:00 1000 mg via ORAL
  Filled 2020-11-22: qty 2

## 2020-11-22 MED ORDER — SODIUM CHLORIDE 0.9 % IV BOLUS
1000.0000 mL | Freq: Once | INTRAVENOUS | Status: AC
  Administered 2020-11-23: 01:00:00 1000 mL via INTRAVENOUS

## 2020-11-22 MED ORDER — DEXAMETHASONE SODIUM PHOSPHATE 10 MG/ML IJ SOLN
8.0000 mg | Freq: Once | INTRAMUSCULAR | Status: AC
  Administered 2020-11-23: 01:00:00 8 mg via INTRAVENOUS
  Filled 2020-11-22: qty 1

## 2020-11-22 MED ORDER — SODIUM CHLORIDE 0.9 % IV SOLN
100.0000 mg | Freq: Two times a day (BID) | INTRAVENOUS | Status: DC
  Administered 2020-11-23 – 2020-11-26 (×8): 100 mg via INTRAVENOUS
  Filled 2020-11-22 (×12): qty 100

## 2020-11-22 NOTE — ED Triage Notes (Signed)
Pt with fever and SOB for past 3 days.  Pt 84 % on 6 L/m in triage.

## 2020-11-22 NOTE — H&P (Signed)
History and Physical    Debra [name] WUJ:[id number] DOB: March 20, 1966 DOA: 11/22/2020  PCP: [name], MD  Patient coming from: Home.  I have personally briefly reviewed patient's old medical records in [facility]  Chief Complaint: Fever and shortness of breath for the past 3 days.  HPI: Debra [name] is a 54 y.o. female with medical history significant of anxiety, panic attacks, depression, osteoarthritis, chronic lower back pain, COPD/active smoker, GERD, history of hiatal hernia, history of bleeding ulcers, hypokalemia, history of pneumonia who is coming to the emergency department with complaints of fever and progressively worse dyspnea for the past 3 days.  All other associated symptoms are occasionally productive of whitish sputum cough, fatigue, malaise, wheezing and headache.  No travel history or sick contacts.  No significant relief by medications at home.  She has doubled her O2 requirement from 3 LPM to 6 LPM with some improvement.  She mentions that she is supposed to use her oxygen at night, but for the past 3 days she has been using it during daytime as well.  She denies hemoptysis, chest pain, palpitations, dizziness, diaphoresis, PND, orthopnea or recent pitting edema of the lower extremities.  She denies abdominal pain, nausea, vomiting, diarrhea, constipation, melena or hematochezia.  No dysuria, frequency or hematuria.  No polyuria, polydipsia, polyphagia or blurred vision.  ED Course: Initial vital signs were temperature 101.5 F, pulse 118, respirations 22, BP 104/86 mmHg and O2 sat 87% on 6 LPM via nasal cannula.  The patient is currently on 10 LPM via HFNC.  He received acetaminophen at 1000 mg p.o. x1, a 1000 mL NS bolus, 8 mg of dexamethasone IVP x1 and doxycycline 100 mg IVPB every 12 hours was started.  Labwork: Coronavirus 2 and influenza PCR was negative.  CBC showed a white count of 9.4 with 85% neutrophils, hemoglobin 10.0 g/dL and platelets 956.  Lactic  acid was normal.  CMP showed normal sodium, chloride and CO2, but potassium level was 2.9 mmol/L.  Glucose 121 and calcium 7.5 mg/dL.  Renal function was normal.  Total protein 6.3, albumin 3.0 g/dL.  Alkaline phosphatase 130 units/L.  AST, ALT and total bilirubin were normal.  Imaging: A one-view portable chest radiograph consistent with RLL airspace disease.  No radiology report available.  Please see image for further detail.  Review of Systems: As per HPI otherwise all other systems reviewed and are negative.  Past Medical History:  Diagnosis Date  . Anxiety    panic attack  . Arthritis    "lower back" (06/07/2018)- osteo  . Chronic lower back pain   . Complication of anesthesia    anesthesia for neck surgery - had confusion/forgetfulness  . COPD (chronic obstructive pulmonary disease) (HCC)   . Depression   . Dyspnea    with exertion  . GERD (gastroesophageal reflux disease)   . History of bleeding ulcers 1994  . History of hiatal hernia   . History of low potassium   . Pneumonia 2013    Past Surgical History:  Procedure Laterality Date  . ANTERIOR CERVICAL DECOMP/DISCECTOMY FUSION N/A 05/18/2017   Procedure: ACDF - C6-C7;  Surgeon: Coletta Memos, MD;  Location: [facility] OR;  Service: Neurosurgery;  Laterality: N/A;  . ANTERIOR LUMBAR FUSION  2013   "L-6"  . ESOPHAGOGASTRODUODENOSCOPY N/A 07/14/2017   Procedure: ESOPHAGOGASTRODUODENOSCOPY (EGD);  Surgeon: Meryl Dare, MD;  Location: [facility] ENDOSCOPY;  Service: Endoscopy;  Laterality: N/A;  . POSTERIOR LUMBAR FUSION  06/07/2018   L5-S1  .  TONSILLECTOMY    . UPPER GASTROINTESTINAL ENDOSCOPY  1994  . VAGINAL HYSTERECTOMY  1997   Social History  reports that she has been smoking cigarettes. She has a 17.50 pack-year smoking history. She has never used smokeless tobacco. She reports current alcohol use. She reports previous drug use.  Allergies  Allergen Reactions  . Cefaclor Diarrhea and Rash    hives  . Sertraline      Suicidal thoughts, insomnia, nightmares  . Amoxicillin-Pot Clavulanate Diarrhea    Has patient had a PCN reaction causing immediate rash, facial/tongue/throat swelling, SOB or lightheadedness with hypotension: No Has patient had a PCN reaction causing severe rash involving mucus membranes or skin necrosis: No Has patient had a PCN reaction that required hospitalization: No Has patient had a PCN reaction occurring within the last 10 years: Yes If all of the above answers are "NO", then may proceed with Cephalosporin use.   . Codeine Nausea And Vomiting    If pt takes a lot of this med  . Latex Itching and Swelling    redness  . Levaquin [Levofloxacin] Swelling    Can take up to 500 mg a day  . Nsaids     Bleeding ulcers  . Ciprofloxacin Nausea Only and Rash    Room was spinning   Family History  Problem Relation Age of Onset  . Hypertension Mother   . COPD Mother   . Ulcers Father   . Heart disease Father   . Heart attack Paternal Uncle   . Heart attack Paternal Grandfather    Prior to Admission medications   Medication Sig Start Date End Date Taking? Authorizing Provider  citalopram (CELEXA) 40 MG tablet Take 40 mg by mouth daily.     [provider]  diazepam (VALIUM) 5 MG tablet Take 1 tablet (5 mg total) by mouth every 6 (six) hours as needed for muscle spasms. 07/27/20   Val EagleBergman, Meghan D, NP  docusate sodium (COLACE) 100 MG capsule Take 1 capsule (100 mg total) by mouth 2 (two) times daily. 07/27/20   Val EagleBergman, Meghan D, NP  omeprazole (PRILOSEC) 40 MG capsule Take 40 mg by mouth daily.     [provider]  oxyCODONE (OXYCONTIN) 15 mg 12 hr tablet Take 1 tablet (15 mg total) by mouth every 12 (twelve) hours. For post operative pain 07/27/20   Val EagleBergman, Meghan D, NP  oxyCODONE 10 MG TABS Take 1 tablet (10 mg total) by mouth every 4 (four) hours as needed for severe pain ((score 7 to 10) postoperative pain). 07/27/20   Val EagleBergman, Meghan D, NP  potassium chloride  (KLOR-CON) 10 MEQ tablet Take 10 mEq by mouth in the morning and at bedtime. 06/09/20   [provider]  pregabalin (LYRICA) 100 MG capsule Take 200 mg by mouth at bedtime.     [provider]  PROAIR HFA 108 [id number](90 Base) MCG/ACT inhaler Inhale 2 puffs into the lungs every 4 (four) hours as needed for wheezing or shortness of breath.  06/19/20   [provider]  rosuvastatin (CRESTOR) 20 MG tablet Take 20 mg by mouth 2 (two) times a week. 06/22/20   [provider]  Tiotropium Bromide Monohydrate (SPIRIVA RESPIMAT) 2.5 MCG/ACT AERS Inhale 2 puffs into the lungs daily. 03/19/20   Josephine IgoIcard, Bradley L, DO   Physical Exam: Vitals:   11/22/20 2140 11/22/20 2145 11/22/20 2230 11/22/20 2234  BP: 104/86 (!) 118/92 105/60   Pulse: (!) 118 (!) 122 (!) 110 (!) 111  Resp: (!) 22  14 (!) 25  Temp: (!) 101.5 F (38.6 C)     TempSrc: Oral     SpO2: (!) 87% (!) 80% 94% 95%  Weight:      Height:       Constitutional: Looks acutely ill, but nontoxic.  In NAD at this time. Eyes: PERRL, lids and conjunctivae are injected. ENMT: HF [place] on.  Mucous membranes are dry. Posterior pharynx clear of any exudate or lesions Neck: normal, supple, no masses, no thyromegaly Respiratory: Decreased breath sounds with bilateral rhonchi and wheezing, no crackles. Normal respiratory effort. No accessory muscle use.  Cardiovascular: Tachycardic at 104 bpm with a rhythm, no murmurs / rubs / gallops. No extremity edema. 2+ pedal pulses. No carotid bruits.  Abdomen: No distention.  Bowel sounds positive.  Soft, no tenderness, no masses palpated. No hepatosplenomegaly. Musculoskeletal: no clubbing / cyanosis. Good ROM, no contractures. Normal muscle tone.  Skin: Multiple areas of ecchymosis on extremities, particularly on arms. Neurologic: CN 2-12 grossly intact. Sensation intact, DTR normal. Strength 5/5 in all 4.  Psychiatric: Normal judgment and insight. Alert and oriented x 3. Normal mood.   Labs on  Admission: I have personally reviewed following labs and imaging studies  CBC: Recent Labs  Lab 11/22/20 2215  WBC 9.4  NEUTROABS 8.0*  HGB 10.0*  HCT 33.2*  MCV 90.7  PLT 243    Basic Metabolic Panel: Recent Labs  Lab 11/22/20 2215  NA 135  K 2.8*  CL 98  CO2 27  GLUCOSE 121*  BUN 6  CREATININE 0.69  CALCIUM 7.5*    GFR: Estimated Creatinine Clearance: 78.2 mL/min (by C-G formula based on SCr of 0.69 mg/dL).  Liver Function Tests: Recent Labs  Lab 11/22/20 2215  AST 31  ALT 28  ALKPHOS 130*  BILITOT 0.5  PROT 6.3*  ALBUMIN 3.0*    Radiological Exams on Admission: No results found.  EKG: Independently reviewed  Assessment/Plan Principal Problem:   Acute respiratory failure with hypoxia (HCC)   CAP (community acquired pneumonia) Observation/stepdown. Continue supplemental oxygen. Continue as scheduled and PRN bronchodilators. Solu-Medrol 40 mg IVP tomorrow evening. Then prednisone taper after tomorrow. Continue doxycycline 100 mg p.o. twice daily. Check sputum Gram stain, culture and sensitivity. Check strep pneumoniae urinary antigen. Follow blood cultures and sensitivity.  Active Problems:   COPD with acute exacerbation (HCC) Pneumonia treatment as above. Continue bronchodilators. Continue glucocorticoids    Tobacco use Nicotine replacement therapy ordered. Staff to provide tobacco cessation information.    Hypokalemia Replacing. Supplement with magnesium. Check magnesium level. Check phosphorus level.    GERD (gastroesophageal reflux disease) Continue PPI.    Mild protein malnutrition (HCC) In the setting of acute disease. Consider dietitian evaluation.    Normocytic anemia Anemia panel as an outpatient. Monitor hematocrit and hemoglobin.    Hyperlipidemia The patient does not want to continue Crestor. Statins producing multiple ecchymosis on extremities. All other statins tried in the past have produced a similar  reaction.   DVT prophylaxis: Lovenox SQ. Code Status:   Full code. Family Communication:   Disposition Plan:   Patient is from:  Home.  Anticipated DC to:  Home.  Anticipated DC date:  11/24/2020 or 11/25/2020.  Anticipated DC barriers: Clinical improvement.  Consults called: Admission status:  Observation/stepdown.  High due to tripling of her usual oxygen requirement from baseline of 3 LPM via [place] to 9-10 L via HF [place] at the moment.  Severity of Illness:  Bobette Mo  MD [facility]  How to contact the [facility] Attending or Consulting provider 7A - 7P or covering provider during after hours 7P -7A, for this patient?   1. Check the care team in [facility] and look for a) attending/consulting [facility] provider listed and b) the [facility] team listed 2. Log into [email or web address] and use [facility] universal password to access. If you do not have the password, please contact the hospital operator. 3. Locate the [facility] provider you are looking for under [facility] and page to a number that you can be directly reached. 4. If you still have difficulty reaching the provider, please page the [facility] (Director on Call) for the Hospitalists listed on amion for assistance.  11/22/2020, 11:47 PM   This document was prepared using Dragon voice recognition software and may contain some unintended transcription errors.

## 2020-11-22 NOTE — ED Provider Notes (Signed)
AP-EMERGENCY DEPT [facility] Emergency Department Provider Note MRN:  [id number]  Arrival date & time: 11/22/20     Chief Complaint   Shortness of Breath   History of Present Illness   Debra [name] is a 54 y.o. year-old female with a history of COPD presenting to the ED with chief complaint of shortness of breath.  Fever on and off for 2 weeks, persistent cough for 4 days.  Severe malaise and fatigue today, felt like she was going to die prior to getting to the hospital.  Short of breath.  Denies chest pain, no abdominal pain.  Review of Systems  A complete 10 system review of systems was obtained and all systems are negative except as noted in the HPI and PMH.   Patient's Health History    Past Medical History:  Diagnosis Date   Anxiety    panic attack   Arthritis    "lower back" (06/07/2018)- osteo   Chronic lower back pain    Complication of anesthesia    anesthesia for neck surgery - had confusion/forgetfulness   COPD (chronic obstructive pulmonary disease) (HCC)    Depression    Dyspnea    with exertion   GERD (gastroesophageal reflux disease)    History of bleeding ulcers 1994   History of hiatal hernia    History of low potassium    Pneumonia 2013    Past Surgical History:  Procedure Laterality Date   ANTERIOR CERVICAL DECOMP/DISCECTOMY FUSION N/A 05/18/2017   Procedure: ACDF - C6-C7;  Surgeon: Coletta Memos, MD;  Location: [facility] OR;  Service: Neurosurgery;  Laterality: N/A;   ANTERIOR LUMBAR FUSION  2013   "L-6"   ESOPHAGOGASTRODUODENOSCOPY N/A 07/14/2017   Procedure: ESOPHAGOGASTRODUODENOSCOPY (EGD);  Surgeon: Meryl Dare, MD;  Location: [facility] ENDOSCOPY;  Service: Endoscopy;  Laterality: N/A;   POSTERIOR LUMBAR FUSION  06/07/2018   L5-S1   TONSILLECTOMY     UPPER GASTROINTESTINAL ENDOSCOPY  1994   VAGINAL HYSTERECTOMY  1997    Family History  Problem Relation Age of Onset   Hypertension Mother    COPD Mother    Ulcers  Father    Heart disease Father    Heart attack Paternal Uncle    Heart attack Paternal Grandfather     Social History   Socioeconomic History   Marital status: Divorced    Spouse name: Not on file   Number of children: Not on file   Years of education: Not on file   Highest education level: Not on file  Occupational History   Not on file  Tobacco Use   Smoking status: Current Every Day Smoker    Packs/day: 0.50    Years: 35.00    Pack years: 17.50    Types: Cigarettes   Smokeless tobacco: Never Used  Building services engineer Use: Some days  Substance and Sexual Activity   Alcohol use: Yes    Comment: 06/07/2018 "might have a beer q 3-4 months"   Drug use: Not Currently   Sexual activity: Not Currently  Other Topics Concern   Not on file  Social History Narrative   Not on file   Social Determinants of Health   Financial Resource Strain: Not on file  Food Insecurity: Not on file  Transportation Needs: Not on file  Physical Activity: Not on file  Stress: Not on file  Social Connections: Not on file  Intimate Partner Violence: Not on file     Physical Exam   Vitals:  11/22/20 2230 11/22/20 2234  BP: 105/60   Pulse: (!) 110 (!) 111  Resp: 14 (!) 25  Temp:    SpO2: 94% 95%    CONSTITUTIONAL: Chronically ill-appearing, NAD NEURO:  Alert and oriented x 3, no focal deficits EYES:  eyes equal and reactive ENT/NECK:  no LAD, no JVD CARDIO: Tachycardic rate, well-perfused, normal S1 and S2 PULM: Scattered rhonchi, tachypneic GI/GU:  normal bowel sounds, non-distended, non-tender MSK/SPINE:  No gross deformities, no edema SKIN:  no rash, atraumatic PSYCH:  Appropriate speech and behavior  *Additional and/or pertinent findings included in MDM below  Diagnostic and Interventional Summary    EKG Interpretation  Date/Time:    Ventricular Rate:    PR Interval:    QRS Duration:   QT Interval:    QTC Calculation:   R Axis:     Text  Interpretation:        Labs Reviewed  CBC WITH DIFFERENTIAL/PLATELET - Abnormal; Notable for the following components:      Result Value   RBC 3.66 (*)    Hemoglobin 10.0 (*)    HCT 33.2 (*)    RDW 17.2 (*)    Neutro Abs 8.0 (*)    Abs Immature Granulocytes 0.14 (*)    All other components within normal limits  COMPREHENSIVE METABOLIC PANEL - Abnormal; Notable for the following components:   Potassium 2.8 (*)    Glucose, Bld 121 (*)    Calcium 7.5 (*)    Total Protein 6.3 (*)    Albumin 3.0 (*)    Alkaline Phosphatase 130 (*)    All other components within normal limits  LACTATE DEHYDROGENASE - Abnormal; Notable for the following components:   LDH 270 (*)    All other components within normal limits  C-REACTIVE PROTEIN - Abnormal; Notable for the following components:   CRP 45.2 (*)    All other components within normal limits  RESP PANEL BY RT-PCR (FLU A&B, COVID) ARPGX2  CULTURE, BLOOD (ROUTINE X 2)  CULTURE, BLOOD (ROUTINE X 2)  LACTIC ACID, PLASMA  PROCALCITONIN  FERRITIN  TRIGLYCERIDES  LACTIC ACID, PLASMA  D-DIMER, QUANTITATIVE (NOT AT [facility])  FIBRINOGEN  POC URINE PREG, ED    DG Chest Portable 1 View    (Results Pending)    Medications  dexamethasone (DECADRON) injection 8 mg (has no administration in time range)  acetaminophen (TYLENOL) tablet 1,000 mg (has no administration in time range)  doxycycline (VIBRAMYCIN) 100 mg in sodium chloride 0.9 % 250 mL IVPB (has no administration in time range)  sodium chloride 0.9 % bolus 1,000 mL (has no administration in time range)     Procedures  /  Critical Care .Critical Care Performed by: Sabas Sous, MD Authorized by: Sabas Sous, MD   Critical care provider statement:    Critical care time (minutes):  35   Critical care was necessary to treat or prevent imminent or life-threatening deterioration of the following conditions:  Respiratory failure   Critical care was time spent personally by me on the  following activities:  Discussions with consultants, evaluation of patient's response to treatment, examination of patient, ordering and performing treatments and interventions, ordering and review of laboratory studies, ordering and review of radiographic studies, pulse oximetry, re-evaluation of patient's condition, obtaining history from patient or surrogate and review of old charts    ED Course and Medical Decision Making  I have reviewed the triage vital signs, the nursing notes, and pertinent available records from the  EMR.  Listed above are laboratory and imaging tests that I personally ordered, reviewed, and interpreted and then considered in my medical decision making (see below for details).  Suspect COVID-19 versus pneumonia, providing Decadron and awaiting laboratory assessment.     Covid test is negative.  Suspect pneumonia, adding on antibiotics, fluids.  Will admit to medicine.  No evidence of DVT on exam, doubt PE.  Elmer Sow. Pilar Plate, MD [facility] [email or web address]  Final Clinical Impressions(s) / ED Diagnoses     ICD-10-CM   1. Community acquired pneumonia, unspecified laterality  J18.9     ED Discharge Orders    None       Discharge Instructions Discussed with and Provided to Patient:   Discharge Instructions   None       Sabas Sous, MD 11/22/20 2320

## 2020-11-23 ENCOUNTER — Encounter (HOSPITAL_COMMUNITY): Payer: Self-pay | Admitting: Radiology

## 2020-11-23 ENCOUNTER — Observation Stay (HOSPITAL_COMMUNITY): Payer: Medicaid Other

## 2020-11-23 DIAGNOSIS — B009 Herpesviral infection, unspecified: Secondary | ICD-10-CM | POA: Diagnosis not present

## 2020-11-23 DIAGNOSIS — J9621 Acute and chronic respiratory failure with hypoxia: Secondary | ICD-10-CM | POA: Diagnosis not present

## 2020-11-23 DIAGNOSIS — Z885 Allergy status to narcotic agent status: Secondary | ICD-10-CM | POA: Diagnosis not present

## 2020-11-23 DIAGNOSIS — Z6823 Body mass index (BMI) 23.0-23.9, adult: Secondary | ICD-10-CM | POA: Diagnosis not present

## 2020-11-23 DIAGNOSIS — J189 Pneumonia, unspecified organism: Secondary | ICD-10-CM | POA: Diagnosis present

## 2020-11-23 DIAGNOSIS — M1990 Unspecified osteoarthritis, unspecified site: Secondary | ICD-10-CM | POA: Diagnosis present

## 2020-11-23 DIAGNOSIS — R0602 Shortness of breath: Secondary | ICD-10-CM | POA: Diagnosis not present

## 2020-11-23 DIAGNOSIS — K219 Gastro-esophageal reflux disease without esophagitis: Secondary | ICD-10-CM

## 2020-11-23 DIAGNOSIS — E876 Hypokalemia: Secondary | ICD-10-CM

## 2020-11-23 DIAGNOSIS — E785 Hyperlipidemia, unspecified: Secondary | ICD-10-CM | POA: Diagnosis present

## 2020-11-23 DIAGNOSIS — D649 Anemia, unspecified: Secondary | ICD-10-CM

## 2020-11-23 DIAGNOSIS — Z9981 Dependence on supplemental oxygen: Secondary | ICD-10-CM | POA: Diagnosis not present

## 2020-11-23 DIAGNOSIS — F17210 Nicotine dependence, cigarettes, uncomplicated: Secondary | ICD-10-CM | POA: Diagnosis present

## 2020-11-23 DIAGNOSIS — F410 Panic disorder [episodic paroxysmal anxiety] without agoraphobia: Secondary | ICD-10-CM | POA: Diagnosis present

## 2020-11-23 DIAGNOSIS — T380X5A Adverse effect of glucocorticoids and synthetic analogues, initial encounter: Secondary | ICD-10-CM | POA: Diagnosis not present

## 2020-11-23 DIAGNOSIS — Z8711 Personal history of peptic ulcer disease: Secondary | ICD-10-CM | POA: Diagnosis not present

## 2020-11-23 DIAGNOSIS — J441 Chronic obstructive pulmonary disease with (acute) exacerbation: Secondary | ICD-10-CM | POA: Diagnosis not present

## 2020-11-23 DIAGNOSIS — F32A Depression, unspecified: Secondary | ICD-10-CM | POA: Diagnosis present

## 2020-11-23 DIAGNOSIS — J181 Lobar pneumonia, unspecified organism: Secondary | ICD-10-CM | POA: Diagnosis present

## 2020-11-23 DIAGNOSIS — D75839 Thrombocytosis, unspecified: Secondary | ICD-10-CM | POA: Diagnosis not present

## 2020-11-23 DIAGNOSIS — J440 Chronic obstructive pulmonary disease with acute lower respiratory infection: Secondary | ICD-10-CM | POA: Diagnosis present

## 2020-11-23 DIAGNOSIS — M545 Low back pain, unspecified: Secondary | ICD-10-CM | POA: Diagnosis present

## 2020-11-23 DIAGNOSIS — E441 Mild protein-calorie malnutrition: Secondary | ICD-10-CM | POA: Diagnosis not present

## 2020-11-23 DIAGNOSIS — G894 Chronic pain syndrome: Secondary | ICD-10-CM | POA: Diagnosis present

## 2020-11-23 DIAGNOSIS — Z9114 Patient's other noncompliance with medication regimen: Secondary | ICD-10-CM | POA: Diagnosis not present

## 2020-11-23 DIAGNOSIS — Z20822 Contact with and (suspected) exposure to covid-19: Secondary | ICD-10-CM | POA: Diagnosis present

## 2020-11-23 DIAGNOSIS — J9601 Acute respiratory failure with hypoxia: Secondary | ICD-10-CM | POA: Diagnosis not present

## 2020-11-23 DIAGNOSIS — R911 Solitary pulmonary nodule: Secondary | ICD-10-CM | POA: Diagnosis present

## 2020-11-23 DIAGNOSIS — Z720 Tobacco use: Secondary | ICD-10-CM | POA: Diagnosis not present

## 2020-11-23 LAB — PHOSPHORUS: Phosphorus: 2.4 mg/dL — ABNORMAL LOW (ref 2.5–4.6)

## 2020-11-23 LAB — HIV ANTIBODY (ROUTINE TESTING W REFLEX): HIV Screen 4th Generation wRfx: NONREACTIVE

## 2020-11-23 LAB — MAGNESIUM: Magnesium: 2.8 mg/dL — ABNORMAL HIGH (ref 1.7–2.4)

## 2020-11-23 LAB — POC URINE PREG, ED: Preg Test, Ur: NEGATIVE

## 2020-11-23 LAB — FIBRINOGEN: Fibrinogen: 535 mg/dL — ABNORMAL HIGH (ref 210–475)

## 2020-11-23 LAB — D-DIMER, QUANTITATIVE: D-Dimer, Quant: 2.37 ug/mL-FEU — ABNORMAL HIGH (ref 0.00–0.50)

## 2020-11-23 LAB — RESP PANEL BY RT-PCR (FLU A&B, COVID) ARPGX2
Influenza A by PCR: NEGATIVE
Influenza B by PCR: NEGATIVE
SARS Coronavirus 2 by RT PCR: NEGATIVE

## 2020-11-23 LAB — LACTIC ACID, PLASMA: Lactic Acid, Venous: 0.9 mmol/L (ref 0.5–1.9)

## 2020-11-23 MED ORDER — PREDNISONE 20 MG PO TABS
40.0000 mg | ORAL_TABLET | Freq: Every day | ORAL | Status: DC
  Administered 2020-11-24 – 2020-11-26 (×3): 40 mg via ORAL
  Filled 2020-11-23 (×3): qty 2

## 2020-11-23 MED ORDER — ALBUTEROL SULFATE (2.5 MG/3ML) 0.083% IN NEBU
2.5000 mg | INHALATION_SOLUTION | Freq: Four times a day (QID) | RESPIRATORY_TRACT | Status: DC
  Administered 2020-11-23: 02:00:00 2.5 mg via RESPIRATORY_TRACT
  Filled 2020-11-23: qty 3

## 2020-11-23 MED ORDER — ACETAMINOPHEN 650 MG RE SUPP: 650.0000 mg | Freq: Four times a day (QID) | RECTAL | Status: DC | PRN

## 2020-11-23 MED ORDER — ALBUTEROL SULFATE (2.5 MG/3ML) 0.083% IN NEBU
2.5000 mg | INHALATION_SOLUTION | RESPIRATORY_TRACT | Status: DC | PRN
  Administered 2020-11-23 – 2020-11-30 (×4): 2.5 mg via RESPIRATORY_TRACT
  Filled 2020-11-23 (×4): qty 3

## 2020-11-23 MED ORDER — NYSTATIN 100000 UNIT/GM EX CREA
TOPICAL_CREAM | Freq: Two times a day (BID) | CUTANEOUS | Status: DC | PRN
  Filled 2020-11-23: qty 15

## 2020-11-23 MED ORDER — PANTOPRAZOLE SODIUM 40 MG PO TBEC
40.0000 mg | DELAYED_RELEASE_TABLET | Freq: Every day | ORAL | Status: DC
  Administered 2020-11-23 – 2020-12-04 (×12): 40 mg via ORAL
  Filled 2020-11-23 (×12): qty 1

## 2020-11-23 MED ORDER — POTASSIUM CHLORIDE IN NACL 40-0.9 MEQ/L-% IV SOLN
INTRAVENOUS | Status: AC
  Filled 2020-11-23 (×4): qty 1000

## 2020-11-23 MED ORDER — DIAZEPAM 5 MG PO TABS
5.0000 mg | ORAL_TABLET | Freq: Four times a day (QID) | ORAL | Status: DC | PRN
  Administered 2020-11-24 – 2020-12-05 (×35): 5 mg via ORAL
  Filled 2020-11-23 (×34): qty 1

## 2020-11-23 MED ORDER — ONDANSETRON HCL 4 MG/2ML IJ SOLN: 4.0000 mg | Freq: Four times a day (QID) | INTRAMUSCULAR | Status: DC | PRN

## 2020-11-23 MED ORDER — ONDANSETRON HCL 4 MG PO TABS: 4.0000 mg | ORAL_TABLET | Freq: Four times a day (QID) | ORAL | Status: DC | PRN

## 2020-11-23 MED ORDER — IPRATROPIUM BROMIDE 0.02 % IN SOLN
0.5000 mg | Freq: Four times a day (QID) | RESPIRATORY_TRACT | Status: DC
  Administered 2020-11-23: 02:00:00 0.5 mg via RESPIRATORY_TRACT
  Filled 2020-11-23: qty 2.5

## 2020-11-23 MED ORDER — POTASSIUM CHLORIDE CRYS ER 10 MEQ PO TBCR
10.0000 meq | EXTENDED_RELEASE_TABLET | Freq: Two times a day (BID) | ORAL | Status: DC
  Administered 2020-11-23 – 2020-12-04 (×24): 10 meq via ORAL
  Filled 2020-11-23 (×39): qty 1

## 2020-11-23 MED ORDER — METHYLPREDNISOLONE SODIUM SUCC 40 MG IJ SOLR
40.0000 mg | Freq: Four times a day (QID) | INTRAMUSCULAR | Status: AC
  Administered 2020-11-23: 18:00:00 40 mg via INTRAVENOUS
  Filled 2020-11-23: qty 1

## 2020-11-23 MED ORDER — MAGNESIUM SULFATE 2 GM/50ML IV SOLN
2.0000 g | Freq: Once | INTRAVENOUS | Status: AC
  Administered 2020-11-23: 05:00:00 2 g via INTRAVENOUS
  Filled 2020-11-23: qty 50

## 2020-11-23 MED ORDER — NALOXONE HCL 0.4 MG/ML IJ SOLN: 0.4000 mg | INTRAMUSCULAR | Status: DC | PRN

## 2020-11-23 MED ORDER — ZOLPIDEM TARTRATE 5 MG PO TABS
5.0000 mg | ORAL_TABLET | Freq: Every evening | ORAL | Status: DC | PRN
  Administered 2020-11-24: 5 mg via ORAL
  Filled 2020-11-23: qty 1

## 2020-11-23 MED ORDER — OXYCODONE-ACETAMINOPHEN 7.5-325 MG PO TABS
1.0000 | ORAL_TABLET | Freq: Three times a day (TID) | ORAL | Status: DC | PRN
  Administered 2020-11-23: 1 via ORAL
  Filled 2020-11-23 (×2): qty 1

## 2020-11-23 MED ORDER — NICOTINE 21 MG/24HR TD PT24
21.0000 mg | MEDICATED_PATCH | Freq: Every day | TRANSDERMAL | Status: DC
  Administered 2020-11-23 – 2020-12-04 (×11): 21 mg via TRANSDERMAL
  Filled 2020-11-23 (×14): qty 1

## 2020-11-23 MED ORDER — CITALOPRAM HYDROBROMIDE 20 MG PO TABS
40.0000 mg | ORAL_TABLET | Freq: Every day | ORAL | Status: DC
  Administered 2020-11-23 – 2020-12-04 (×12): 40 mg via ORAL
  Filled 2020-11-23 (×14): qty 2

## 2020-11-23 MED ORDER — IPRATROPIUM-ALBUTEROL 0.5-2.5 (3) MG/3ML IN SOLN
3.0000 mL | Freq: Four times a day (QID) | RESPIRATORY_TRACT | Status: DC
  Administered 2020-11-23 – 2020-11-27 (×10): 3 mL via RESPIRATORY_TRACT
  Filled 2020-11-23 (×13): qty 3

## 2020-11-23 MED ORDER — ENOXAPARIN SODIUM 40 MG/0.4ML ~~LOC~~ SOLN
40.0000 mg | SUBCUTANEOUS | Status: DC
  Administered 2020-11-23 – 2020-12-04 (×12): 40 mg via SUBCUTANEOUS
  Filled 2020-11-23 (×18): qty 0.4

## 2020-11-23 MED ORDER — SODIUM CHLORIDE 0.9 % IV SOLN
2.0000 g | Freq: Three times a day (TID) | INTRAVENOUS | Status: AC
  Administered 2020-11-23 – 2020-11-27 (×14): 2 g via INTRAVENOUS
  Filled 2020-11-23 (×15): qty 2

## 2020-11-23 MED ORDER — IOHEXOL 350 MG/ML SOLN
75.0000 mL | Freq: Once | INTRAVENOUS | Status: AC | PRN
  Administered 2020-11-23: 09:00:00 75 mL via INTRAVENOUS

## 2020-11-23 MED ORDER — ACETAMINOPHEN 325 MG PO TABS
650.0000 mg | ORAL_TABLET | Freq: Four times a day (QID) | ORAL | Status: DC | PRN
  Administered 2020-11-24 – 2020-12-04 (×7): 650 mg via ORAL
  Filled 2020-11-23 (×7): qty 2

## 2020-11-23 MED ORDER — PREGABALIN 75 MG PO CAPS
200.0000 mg | ORAL_CAPSULE | Freq: Every day | ORAL | Status: DC
  Administered 2020-11-24 – 2020-12-04 (×12): 200 mg via ORAL
  Filled 2020-11-23: qty 2
  Filled 2020-11-23: qty 1
  Filled 2020-11-23 (×5): qty 2
  Filled 2020-11-23 (×2): qty 1
  Filled 2020-11-23: qty 2
  Filled 2020-11-23: qty 1

## 2020-11-23 MED ORDER — POTASSIUM CHLORIDE CRYS ER 20 MEQ PO TBCR
40.0000 meq | EXTENDED_RELEASE_TABLET | Freq: Once | ORAL | Status: AC
  Administered 2020-11-23: 03:00:00 40 meq via ORAL
  Filled 2020-11-23: qty 2

## 2020-11-23 NOTE — ED Notes (Signed)
Pt pulled out iv when getting up to bathroom.

## 2020-11-23 NOTE — ED Notes (Addendum)
Dr. Laural Benes made aware of pt asking someone to pick her up and wanting to leave.

## 2020-11-23 NOTE — ED Notes (Addendum)
Pt inquiring about having EMS take her to [facility], explained to pt that a MD order would be required for any transfer.  Pt then heard on the phone with someone about having her picked up and taken to [facility].  Pt refused to have telemetry placed back on.

## 2020-11-23 NOTE — ED Notes (Addendum)
This nurse walks into room, pt is sitting on toilet in room with oxygen off, IV in right hand pulled out and blood dripping all over the floor. sats are 87%. Her eyes are very heavy and when she talks her speech is slurred and her mouth is extremely dry. Pt had admitted to previous nurse that she had taken percocet pills that she has in her purse. When this nurse asks if she has taken more pills she becomes very argumentative and rips all her tele leads off. I request to pt that I take the pills and lock them up with pharmacy. After several more minutes of arguing I request that Charge nurse Herbert Seta RN come to room. Heather enters the room and converses with patient, pt eventually gives the bottle to [place] who locks them up with pharmacy. Pt is now tearful. She is placed back in bed, oxygen placed on patient. Call light in reach. I encouraged pt to please press call light if she needed assistance in any way. I reminded her the importance of keeping her [place] oxygen in her nose as her saturations drop suddenly when she removes it. She is agreeable to using call light. Fresh ice water given at this time as well. Dr. Standley Dakins notified of the above documented incident.

## 2020-11-23 NOTE — ED Notes (Signed)
Went in pt's room, pt had pulled off her tele leads, her oxygen and BP cuff.  Pt speaks of wanting to leave and be discharged.  Then pt states she is getting worse but is uncooperative with keeping her oxygen on.  Pt wanting to make a call to [facility] police and 911.  Explained to pt that long distance calls are not able to be made from her room phone.

## 2020-11-23 NOTE — ED Notes (Signed)
Oxygen increased to 14lnc hf.

## 2020-11-23 NOTE — ED Notes (Addendum)
Pt pulled out IV again going to Bathroom. Instructed to call for assist. Pt drowsy and slurring words. Pt states she took her own percocet at 0600  Instructed she had them ordered and we would adminster. Dr Laural Benes is aware

## 2020-11-23 NOTE — Progress Notes (Signed)
PROGRESS NOTE   Debra [name]  ZOX:[id number] DOB: 1966-01-09 DOA: 11/22/2020 PCP: [name], MD   Chief Complaint  Patient presents with  . Shortness of Breath    Brief Admission History:  54 y/o female smoker with oxygen dependent COPD normal on 3L/m [place] at home reports progressive fever, cough, shortness of breath, sputum production and  Increasing oxygen requirement at home over last 3 days.  She was admitted with pneumonia, acute on chronic respiratory failure with hypoxia and has tested negative for covid 19 and influenza A/B.   Assessment & Plan:   Principal Problem:   Acute respiratory failure with hypoxia (HCC) Active Problems:   GERD (gastroesophageal reflux disease)   Hypokalemia   Mild protein malnutrition (HCC)   Normocytic anemia   Hyperlipidemia   COPD with acute exacerbation (HCC)   Tobacco use   CAP (community acquired pneumonia)   1. Acute respiratory failure with hypoxia - secondary to a dense RLL pneumonia - added cefepime, continue doxycycline, continue supportive measures and steroids, bronchodilators and follow cultures, strep pneumo and legionella testing is pending.  2. COPD with acute exacerbation - mgmt as above.  3. Tobacco - nicotine patch and cessation counseling given.  4. Hypokalemia  Replacement given, check magnesium and replace as needed.  5. Hyperlipidemia - pt refuses to take statin at this time.    DVT prophylaxis: enoxaparin  Code Status:  Full  Family Communication:  Disposition:   Status is: Observation  The patient remains OBS appropriate and will d/c before 2 midnights.  Dispo: The patient is from: Home              Anticipated d/c is to: Home              Anticipated d/c date is: 1 day              Patient currently is not medically stable to d/c. Consultants:     Procedures:     Antimicrobials:  Cefepime 12/26 >> Doxycycline 12/25 >>   Subjective: Pt has taken percocet tabs she had in purse, she has pulled  out IV several times and pulled off oxygen nasal cannula several times.  She claims she came here for help but has been working against the nurses that have been trying to help her.    Objective: Vitals:   11/23/20 1019 11/23/20 1146 11/23/20 1151 11/23/20 1152  BP:   (!) 89/52   Pulse: 85 85 88 85  Resp: 15 19 (!) 28 19  Temp:      TempSrc:      SpO2: 100% 95% 94% 100%  Weight:      Height:        Intake/Output Summary (Last 24 hours) at 11/23/2020 1209 Last data filed at 11/23/2020 0435 Gross per 24 hour  Intake 1282.51 ml  Output --  Net 1282.51 ml   Filed Weights   11/22/20 2137  Weight: 72.1 kg   Examination:  General exam: Appears calm and comfortable  Respiratory system: diffuse expiratory wheezing. Respiratory effort normal. Cardiovascular system: normal S1 & S2 heard. No JVD, murmurs, rubs, gallops or clicks. No pedal edema. Gastrointestinal system: Abdomen is nondistended, soft and nontender. No organomegaly or masses felt. Normal bowel sounds heard. Central nervous system: Alert and oriented. No focal neurological deficits. Extremities: Symmetric 5 x 5 power. Skin: No rashes, lesions or ulcers Psychiatry: Judgement and insight appear normal. Mood & affect appropriate.   Data Reviewed: I have personally reviewed following  labs and imaging studies  CBC: Recent Labs  Lab 11/22/20 2215  WBC 9.4  NEUTROABS 8.0*  HGB 10.0*  HCT 33.2*  MCV 90.7  PLT 243    Basic Metabolic Panel: Recent Labs  Lab 11/22/20 2215 11/23/20 0536  NA 135  --   K 2.8*  --   CL 98  --   CO2 27  --   GLUCOSE 121*  --   BUN 6  --   CREATININE 0.69  --   CALCIUM 7.5*  --   MG  --  2.8*  PHOS  --  2.4*    GFR: Estimated Creatinine Clearance: 78.2 mL/min (by C-G formula based on SCr of 0.69 mg/dL).  Liver Function Tests: Recent Labs  Lab 11/22/20 2215  AST 31  ALT 28  ALKPHOS 130*  BILITOT 0.5  PROT 6.3*  ALBUMIN 3.0*    CBG: No results for input(s): GLUCAP in  the last 168 hours.  Recent Results (from the past 240 hour(s))  Resp Panel by RT-PCR (Flu A&B, Covid) Nasopharyngeal Swab     Status: None   Collection Time: 11/22/20 10:15 PM   Specimen: Nasopharyngeal Swab; Nasopharyngeal(NP) swabs in vial transport medium  Result Value Ref Range Status   SARS Coronavirus 2 by RT PCR NEGATIVE NEGATIVE Final    Comment: (NOTE) SARS-CoV-2 target nucleic acids are NOT DETECTED.  The SARS-CoV-2 RNA is generally detectable in upper respiratory specimens during the acute phase of infection. The lowest concentration of SARS-CoV-2 viral copies this assay can detect is 138 copies/mL. A negative result does not preclude SARS-Cov-2 infection and should not be used as the sole basis for treatment or other patient management decisions. A negative result may occur with  improper specimen collection/handling, submission of specimen other than nasopharyngeal swab, presence of viral mutation(s) within the areas targeted by this assay, and inadequate number of viral copies(<138 copies/mL). A negative result must be combined with clinical observations, patient history, and epidemiological information. The expected result is Negative.  Fact Sheet for Patients:  BloggerCourse.com  Fact Sheet for Healthcare Providers:  SeriousBroker.it  This test is no t yet approved or cleared by the [place] FDA and  has been authorized for detection and/or diagnosis of SARS-CoV-2 by FDA under an Emergency Use Authorization (EUA). This EUA will remain  in effect (meaning this test can be used) for the duration of the COVID-19 declaration under Section 564(b)(1) of the Act, 21 U.S.C.section 360bbb-3(b)(1), unless the authorization is terminated  or revoked sooner.       Influenza A by PCR NEGATIVE NEGATIVE Final   Influenza B by PCR NEGATIVE NEGATIVE Final    Comment: (NOTE) The Xpert Xpress SARS-CoV-2/FLU/RSV plus assay  is intended as an aid in the diagnosis of influenza from Nasopharyngeal swab specimens and should not be used as a sole basis for treatment. Nasal washings and aspirates are unacceptable for Xpert Xpress SARS-CoV-2/FLU/RSV testing.  Fact Sheet for Patients: BloggerCourse.com  Fact Sheet for Healthcare Providers: SeriousBroker.it  This test is not yet approved or cleared by the [place] FDA and has been authorized for detection and/or diagnosis of SARS-CoV-2 by FDA under an Emergency Use Authorization (EUA). This EUA will remain in effect (meaning this test can be used) for the duration of the COVID-19 declaration under Section 564(b)(1) of the Act, 21 U.S.C. section 360bbb-3(b)(1), unless the authorization is terminated or revoked.  Performed at [facility], [address], [place], [place] [address]   Resp Panel by  RT-PCR (Flu A&B, Covid) Nasopharyngeal Swab     Status: None   Collection Time: 11/23/20  8:54 AM   Specimen: Nasopharyngeal Swab; Nasopharyngeal(NP) swabs in vial transport medium  Result Value Ref Range Status   SARS Coronavirus 2 by RT PCR NEGATIVE NEGATIVE Final    Comment: (NOTE) SARS-CoV-2 target nucleic acids are NOT DETECTED.  The SARS-CoV-2 RNA is generally detectable in upper respiratory specimens during the acute phase of infection. The lowest concentration of SARS-CoV-2 viral copies this assay can detect is 138 copies/mL. A negative result does not preclude SARS-Cov-2 infection and should not be used as the sole basis for treatment or other patient management decisions. A negative result may occur with  improper specimen collection/handling, submission of specimen other than nasopharyngeal swab, presence of viral mutation(s) within the areas targeted by this assay, and inadequate number of viral copies(<138 copies/mL). A negative result must be combined with clinical observations, patient history,  and epidemiological information. The expected result is Negative.  Fact Sheet for Patients:  BloggerCourse.comhttps://www.fda.gov/media/152166/download  Fact Sheet for Healthcare Providers:  SeriousBroker.ithttps://www.fda.gov/media/152162/download  This test is no t yet approved or cleared by the [place] FDA and  has been authorized for detection and/or diagnosis of SARS-CoV-2 by FDA under an Emergency Use Authorization (EUA). This EUA will remain  in effect (meaning this test can be used) for the duration of the COVID-19 declaration under Section 564(b)(1) of the Act, 21 U.S.C.section 360bbb-3(b)(1), unless the authorization is terminated  or revoked sooner.       Influenza A by PCR NEGATIVE NEGATIVE Final   Influenza B by PCR NEGATIVE NEGATIVE Final    Comment: (NOTE) The Xpert Xpress SARS-CoV-2/FLU/RSV plus assay is intended as an aid in the diagnosis of influenza from Nasopharyngeal swab specimens and should not be used as a sole basis for treatment. Nasal washings and aspirates are unacceptable for Xpert Xpress SARS-CoV-2/FLU/RSV testing.  Fact Sheet for Patients: BloggerCourse.comhttps://www.fda.gov/media/152166/download  Fact Sheet for Healthcare Providers: SeriousBroker.ithttps://www.fda.gov/media/152162/download  This test is not yet approved or cleared by the [place] FDA and has been authorized for detection and/or diagnosis of SARS-CoV-2 by FDA under an Emergency Use Authorization (EUA). This EUA will remain in effect (meaning this test can be used) for the duration of the COVID-19 declaration under Section 564(b)(1) of the Act, 21 U.S.C. section 360bbb-3(b)(1), unless the authorization is terminated or revoked.  Performed at [facility], [address] [place], [place] [address]      Radiology Studies: CT ANGIO CHEST PE W OR WO CONTRAST  Result Date: 11/23/2020 CLINICAL DATA:  Shortness of breath and left-sided chest pain with decreased oxygen saturation EXAM: CT ANGIOGRAPHY CHEST WITH CONTRAST TECHNIQUE:  Multidetector CT imaging of the chest was performed using the standard protocol during bolus administration of intravenous contrast. Multiplanar CT image reconstructions and MIPs were obtained to evaluate the vascular anatomy. CONTRAST:  75mL OMNIPAQUE IOHEXOL 350 MG/ML SOLN COMPARISON:  Chest x-ray from the previous day. FINDINGS: Cardiovascular: Thoracic aorta shows minimal atherosclerotic calcifications. No aneurysmal dilatation or dissection is noted. No cardiac enlargement is seen. The coronary arteries demonstrate mild calcification. Pulmonary artery is well visualized within normal branching pattern. No findings to suggest pulmonary emboli are noted. Mediastinum/Nodes: Thoracic inlet is within normal limits. Scattered hilar and subcarinal adenopathy is noted likely reactive in nature. The esophagus is within normal limits. Lungs/Pleura: Consolidation is noted in the right lower lobe as well as scattered predominately peripheral airspace opacities within both lungs. No sizable effusion is noted. No  sizable parenchymal nodules are noted. Upper Abdomen: No acute abnormality noted. Musculoskeletal: No acute bony abnormality is noted Review of the MIP images confirms the above findings. IMPRESSION: Right lower lobe consolidation with patchy ground-glass airspace opacities bilaterally consistent with multifocal pneumonia. Correlate with COVID-19 testing. No evidence of pulmonary emboli. Aortic Atherosclerosis (ICD10-I70.0). Electronically Signed   By: Alcide Clever M.D.   On: 11/23/2020 09:09   DG Chest Portable 1 View  Result Date: 11/22/2020 CLINICAL DATA:  Shortness of breath, COPD. EXAM: PORTABLE CHEST 1 VIEW COMPARISON:  Chest x-ray 11/12/2019, CT chest 02/07/2020 FINDINGS: The heart size and mediastinal contours are within normal limits. Hyperinflation of the lungs consistent with emphysematous changes. Interval increase in interstitial markings diffusely but more prominent within the lower lobes. Hazy  patchy bilateral lower lobe hazy airspace opacities. No pleural effusion. No pneumothorax. No acute osseous abnormality. IMPRESSION: Increased interstitial markings and hazy airspace opacities more prominent within the lower lobes. Findings likely represent infection/inflammation. COVID-19 infection not excluded. Followup PA and lateral chest X-ray is recommended in 3-4 weeks following therapy to ensure resolution and exclude underlying malignancy. Electronically Signed   By: Tish Frederickson M.D.   On: 11/22/2020 21:40   Scheduled Meds: . citalopram  40 mg Oral Daily  . enoxaparin (LOVENOX) injection  40 mg Subcutaneous Q24H  . ipratropium-albuterol  3 mL Nebulization Q6H  . methylPREDNISolone (SOLU-MEDROL) injection  40 mg Intravenous Q6H   Followed by  . [START ON 11/24/2020] predniSONE  40 mg Oral Q breakfast  . nicotine  21 mg Transdermal Daily  . pantoprazole  40 mg Oral Daily  . potassium chloride  10 mEq Oral BID  . pregabalin  200 mg Oral QHS   Continuous Infusions: . 0.9 % NaCl with KCl 40 mEq / L 100 mL/hr at 11/23/20 0431  . ceFEPime (MAXIPIME) IV 2 g (11/23/20 1143)  . doxycycline (VIBRAMYCIN) IV Stopped (11/23/20 0435)     LOS: 0 days   Critical Care Procedure Note Authorized and Performed by: Maryln Manuel MD  Total Critical Care time:  35 minutes  Due to a high probability of clinically significant, life threatening deterioration, the patient required my highest level of preparedness to intervene emergently and I personally spent this critical care time directly and personally managing the patient.  This critical care time included obtaining a history; examining the patient, pulse oximetry; ordering and review of studies; arranging urgent treatment with development of a management plan; evaluation of patient's response of treatment; frequent reassessment; and discussions with other providers.  This critical care time was performed to assess and manage the high probability of  imminent and life threatening deterioration that could result in multi-organ failure.  It was exclusive of separately billable procedures and treating other patients and teaching time.   Standley Dakins, MD How to contact the [facility] Attending or Consulting provider 7A - 7P or covering provider during after hours 7P -7A, for this patient?  1. Check the care team in [facility] and look for a) attending/consulting [facility] provider listed and b) the [facility] team listed 2. Log into [email or web address] and use [place]'s universal password to access. If you do not have the password, please contact the hospital operator. 3. Locate the [facility] provider you are looking for under [facility] and page to a number that you can be directly reached. 4. If you still have difficulty reaching the provider, please page the [facility] (Director on Call) for the Hospitalists listed on amion for assistance.  11/23/2020,  12:09 PM   '

## 2020-11-23 NOTE — Evaluation (Signed)
Physical Therapy Evaluation Patient Details Name: Debra [name] MRN: [id number] DOB: Oct 30, 1966 Today's Date: 11/23/2020   History of Present Illness  Debra [name] is a 53 y.o. female with medical history significant of anxiety, panic attacks, depression, osteoarthritis, chronic lower back pain, COPD/active smoker, GERD, history of hiatal hernia, history of bleeding ulcers, hypokalemia, history of pneumonia who is coming to the emergency department with complaints of fever and progressively worse dyspnea for the past 3 days.  All other associated symptoms are occasionally productive of whitish sputum cough, fatigue, malaise, wheezing and headache.  No travel history or sick contacts.  No significant relief by medications at home.  She has doubled her O2 requirement from 3 LPM to 6 LPM with some improvement.  She mentions that she is supposed to use her oxygen at night, but for the past 3 days she has been using it during daytime as well.  She denies hemoptysis, chest pain, palpitations, dizziness, diaphoresis, PND, orthopnea or recent pitting edema of the lower extremities.  She denies abdominal pain, nausea, vomiting, diarrhea, constipation, melena or hematochezia.  No dysuria, frequency or hematuria.  No polyuria, polydipsia, polyphagia or blurred vision.    Clinical Impression  Patient functioning at baseline for functional mobility and gait.  Patient is ambulating in room and transferring to commode without problem.  Plan:  Patient discharged from physical therapy to care of nursing for ambulation daily as tolerated for length of stay.     Follow Up Recommendations Supervision - Intermittent    Equipment Recommendations  None recommended by PT    Recommendations for Other Services       Precautions / Restrictions Precautions Precautions: None Restrictions Weight Bearing Restrictions: No      Mobility  Bed Mobility Overal bed mobility: Modified Independent                   Transfers Overall transfer level: Modified independent                  Ambulation/Gait Ambulation/Gait assistance: Modified independent (Device/Increase time) Gait Distance (Feet): 10 Feet Assistive device: None Gait Pattern/deviations: WFL(Within Functional Limits) Gait velocity: decreased   General Gait Details: grossly WFL, demonstrates good return for taking steps at bedside without loss of balance  Stairs            Wheelchair Mobility    Modified Rankin (Stroke Patients Only)       Balance Overall balance assessment: No apparent balance deficits (not formally assessed)                                           Pertinent Vitals/Pain Pain Assessment: No/denies pain    Home Living Family/patient expects to be discharged to:: Private residence Living Arrangements: Alone Available Help at Discharge: Family;Available PRN/intermittently Type of Home: Mobile home Home Access: Level entry     Home Layout: One level Home Equipment: Walker - 2 wheels      Prior Function Level of Independence: Independent         Comments: Tourist information centre manager, drives     Higher education careers adviser        Extremity/Trunk Assessment   Upper Extremity Assessment Upper Extremity Assessment: Overall WFL for tasks assessed    Lower Extremity Assessment Lower Extremity Assessment: Overall WFL for tasks assessed    Cervical / Trunk Assessment Cervical / Trunk Assessment: Normal  Communication  Communication: No difficulties  Cognition Arousal/Alertness: Awake/alert Behavior During Therapy: WFL for tasks assessed/performed Overall Cognitive Status: Within Functional Limits for tasks assessed                                        General Comments      Exercises     Assessment/Plan    PT Assessment Patent does not need any further PT services  PT Problem List         PT Treatment Interventions      PT Goals (Current  goals can be found in the Care Plan section)  Acute Rehab PT Goals Patient Stated Goal: return home PT Goal Formulation: With patient Time For Goal Achievement: 11/23/20 Potential to Achieve Goals: Good    Frequency     Barriers to discharge        Co-evaluation               AM-PAC PT "6 Clicks" Mobility  Outcome Measure Help needed turning from your back to your side while in a flat bed without using bedrails?: None Help needed moving from lying on your back to sitting on the side of a flat bed without using bedrails?: None Help needed moving to and from a bed to a chair (including a wheelchair)?: None Help needed standing up from a chair using your arms (e.g., wheelchair or bedside chair)?: None Help needed to walk in hospital room?: None Help needed climbing 3-5 steps with a railing? : None 6 Click Score: 24    End of Session   Activity Tolerance: Patient tolerated treatment well Patient left: in bed;with call bell/phone within reach Nurse Communication: Mobility status PT Visit Diagnosis: Unsteadiness on feet (R26.81);Other abnormalities of gait and mobility (R26.89);Muscle weakness (generalized) (M62.81)    Time: 5176-1607 PT Time Calculation (min) (ACUTE ONLY): 20 min   Charges:   PT Evaluation $PT Eval Moderate Complexity: 1 Mod PT Treatments $Therapeutic Activity: 8-22 mins        2:31 PM, 11/23/20 Ocie Bob, MPT Physical Therapist with [facility] [phone] office [phone] mobile phone

## 2020-11-23 NOTE — ED Notes (Signed)
Oxygen increased to 10lnc hf.

## 2020-11-23 NOTE — ED Notes (Signed)
Pt sats dropped to 80s, Increased oxygen to 10lnc, sats increased to upper 80s, increased oxygen to 14lnc, sats increased to lower 90s.

## 2020-11-23 NOTE — ED Notes (Signed)
Pt is inpatient status, vital signs will be completed as ordered for inpatient.

## 2020-11-23 NOTE — ED Notes (Signed)
Oxygen weaned by rt to 8lhfnc.

## 2020-11-24 DIAGNOSIS — J9621 Acute and chronic respiratory failure with hypoxia: Secondary | ICD-10-CM | POA: Diagnosis not present

## 2020-11-24 DIAGNOSIS — J9601 Acute respiratory failure with hypoxia: Secondary | ICD-10-CM | POA: Diagnosis not present

## 2020-11-24 DIAGNOSIS — E785 Hyperlipidemia, unspecified: Secondary | ICD-10-CM

## 2020-11-24 DIAGNOSIS — J189 Pneumonia, unspecified organism: Secondary | ICD-10-CM | POA: Diagnosis not present

## 2020-11-24 DIAGNOSIS — Z720 Tobacco use: Secondary | ICD-10-CM

## 2020-11-24 DIAGNOSIS — J441 Chronic obstructive pulmonary disease with (acute) exacerbation: Secondary | ICD-10-CM | POA: Diagnosis not present

## 2020-11-24 LAB — STREP PNEUMONIAE URINARY ANTIGEN: Strep Pneumo Urinary Antigen: NEGATIVE

## 2020-11-24 LAB — CBC WITH DIFFERENTIAL/PLATELET
Abs Immature Granulocytes: 0.06 10*3/uL (ref 0.00–0.07)
Basophils Absolute: 0 10*3/uL (ref 0.0–0.1)
Basophils Relative: 0 %
Eosinophils Absolute: 0 10*3/uL (ref 0.0–0.5)
Eosinophils Relative: 0 %
HCT: 29.8 % — ABNORMAL LOW (ref 36.0–46.0)
Hemoglobin: 9 g/dL — ABNORMAL LOW (ref 12.0–15.0)
Immature Granulocytes: 1 %
Lymphocytes Relative: 9 %
Lymphs Abs: 0.8 10*3/uL (ref 0.7–4.0)
MCH: 27.6 pg (ref 26.0–34.0)
MCHC: 30.2 g/dL (ref 30.0–36.0)
MCV: 91.4 fL (ref 80.0–100.0)
Monocytes Absolute: 0.3 10*3/uL (ref 0.1–1.0)
Monocytes Relative: 4 %
Neutro Abs: 6.9 10*3/uL (ref 1.7–7.7)
Neutrophils Relative %: 86 %
Platelets: 235 10*3/uL (ref 150–400)
RBC: 3.26 MIL/uL — ABNORMAL LOW (ref 3.87–5.11)
RDW: 17.6 % — ABNORMAL HIGH (ref 11.5–15.5)
WBC: 8.1 10*3/uL (ref 4.0–10.5)
nRBC: 0 % (ref 0.0–0.2)

## 2020-11-24 LAB — COMPREHENSIVE METABOLIC PANEL
ALT: 34 U/L (ref 0–44)
AST: 48 U/L — ABNORMAL HIGH (ref 15–41)
Albumin: 2.8 g/dL — ABNORMAL LOW (ref 3.5–5.0)
Alkaline Phosphatase: 124 U/L (ref 38–126)
Anion gap: 10 (ref 5–15)
BUN: 7 mg/dL (ref 6–20)
CO2: 26 mmol/L (ref 22–32)
Calcium: 7.7 mg/dL — ABNORMAL LOW (ref 8.9–10.3)
Chloride: 103 mmol/L (ref 98–111)
Creatinine, Ser: 0.46 mg/dL (ref 0.44–1.00)
GFR, Estimated: >60 mL/min (ref >60)
Glucose, Bld: 133 mg/dL — ABNORMAL HIGH (ref 70–99)
Potassium: 3.9 mmol/L (ref 3.5–5.1)
Sodium: 139 mmol/L (ref 135–145)
Total Bilirubin: 0.6 mg/dL (ref 0.3–1.2)
Total Protein: 5.9 g/dL — ABNORMAL LOW (ref 6.5–8.1)

## 2020-11-24 LAB — MAGNESIUM: Magnesium: 2.2 mg/dL (ref 1.7–2.4)

## 2020-11-24 MED ORDER — ZOLPIDEM TARTRATE 5 MG PO TABS
5.0000 mg | ORAL_TABLET | Freq: Every evening | ORAL | Status: DC | PRN
  Administered 2020-11-24 – 2020-11-30 (×5): 5 mg via ORAL
  Filled 2020-11-24 (×5): qty 1

## 2020-11-24 MED ORDER — OXYCODONE HCL 5 MG PO TABS
5.0000 mg | ORAL_TABLET | ORAL | Status: AC | PRN
Start: 2020-11-24 — End: 2020-11-24
  Administered 2020-11-24 (×2): 5 mg via ORAL
  Filled 2020-11-24 (×2): qty 1

## 2020-11-24 MED ORDER — GUAIFENESIN ER 600 MG PO TB12
1200.0000 mg | ORAL_TABLET | Freq: Two times a day (BID) | ORAL | Status: DC
  Administered 2020-11-24 – 2020-12-04 (×22): 1200 mg via ORAL
  Filled 2020-11-24 (×22): qty 2

## 2020-11-24 MED ORDER — CHLORHEXIDINE GLUCONATE CLOTH 2 % EX PADS
6.0000 | MEDICATED_PAD | Freq: Every day | CUTANEOUS | Status: DC
  Administered 2020-11-24 – 2020-12-03 (×10): 6 via TOPICAL

## 2020-11-24 MED ORDER — OXYCODONE-ACETAMINOPHEN 5-325 MG PO TABS
1.5000 | ORAL_TABLET | Freq: Three times a day (TID) | ORAL | Status: DC | PRN
  Administered 2020-11-24 – 2020-12-05 (×32): 1.5 via ORAL
  Filled 2020-11-24 (×32): qty 2

## 2020-11-24 NOTE — ED Notes (Signed)
Pt in bed, pt arouses easily to verbal stim, pt denies pain at iv site, meal tray given, pt states that she just wants to rest.

## 2020-11-24 NOTE — Progress Notes (Signed)
OT Cancellation Note  Patient Details Name: Debra [name] MRN: [id number] DOB: 1966/08/15   Cancelled Treatment:    Reason Eval/Treat Not Completed: OT screened, no needs identified, will sign off. Pt performing ADLs and mobility tasks at mod independent level. No further OT services required at this time.  Ezra Sites, OTR/L  [phone] 11/24/2020, 7:38 AM

## 2020-11-24 NOTE — Progress Notes (Signed)
PROGRESS NOTE   Debra [name]  WUJ:[id number] DOB: 1966/03/08 DOA: 11/22/2020 PCP: [name], MD   Chief Complaint  Patient presents with   Shortness of Breath    Brief Admission History:  54 y/o female smoker with oxygen dependent COPD normal on 3L/m [place] at home reports progressive fever, cough, shortness of breath, sputum production and  Increasing oxygen requirement at home over last 3 days.  She was admitted with pneumonia, acute on chronic respiratory failure with hypoxia and has tested negative for covid 19 and influenza A/B.   Assessment & Plan:   Principal Problem:   Acute respiratory failure with hypoxia (HCC) Active Problems:   GERD (gastroesophageal reflux disease)   Hypokalemia   Mild protein malnutrition (HCC)   Normocytic anemia   Hyperlipidemia   COPD with acute exacerbation (HCC)   Tobacco use   CAP (community acquired pneumonia)   Acute on chronic respiratory failure with hypoxia (HCC)   1. Acute respiratory failure with hypoxia - secondary to a dense RLL pneumonia - continue cefepime, continue doxycycline, continue supportive measures and steroids, bronchodilators and follow cultures, strep pneumo and legionella testing is pending.  Pt had been demanding levofloxacin but with her history of QT prolongation did not agree with starting.  Pt was counseled about this and verbalized understanding.  2. COPD with acute exacerbation - mgmt as above. Unfortunately she has been refusing doses of steroids, etc.  I added mucinex 1200 mg BID.  3. Tobacco - nicotine patch and cessation counseling given.  4. Hypokalemia - repleted.  5. Hyperlipidemia - pt refuses to take statin at this time.   DVT prophylaxis: enoxaparin  Code Status:  Full  Family Communication: plan carefully discussed with patient at length at bedside, queries were answered and patient verbalized understanding Disposition: Home when medically stabilized  Status is: Inpatient   The patient will  require care spanning > 2 midnights and should be moved to inpatient because: IV treatments appropriate due to intensity of illness or inability to take PO and Inpatient level of care appropriate due to severity of illness  Dispo: The patient is from: Home              Anticipated d/c is to: Home              Anticipated d/c date is: 3 days              Patient currently is not medically stable to d/c. Consultants:     Procedures:     Antimicrobials:  Cefepime 12/26 >> Doxycycline 12/25 >>   Subjective: Pt continues to refuse treatments and have unreasonable demands.  I have asked her to please allow the medical staff to treat her with goal of getting her better.     Objective: Vitals:   11/24/20 0815 11/24/20 0830 11/24/20 1000 11/24/20 1137  BP:   132/80 116/63  Pulse: 78 85 78 84  Resp:   18 20  Temp:    98.1 F (36.7 C)  TempSrc:    Oral  SpO2: 97% 100% 97% 94%  Weight:      Height:        Intake/Output Summary (Last 24 hours) at 11/24/2020 1149 Last data filed at 11/24/2020 1044 Gross per 24 hour  Intake 737.28 ml  Output --  Net 737.28 ml   Filed Weights   11/22/20 2137  Weight: 72.1 kg   Examination:  General exam: Appears calm and comfortable  Respiratory system: diffuse expiratory wheezing.  Respiratory effort normal. Cardiovascular system: normal S1 & S2 heard. No JVD, murmurs, rubs, gallops or clicks. No pedal edema. Gastrointestinal system: Abdomen is nondistended, soft and nontender. No organomegaly or masses felt. Normal bowel sounds heard. Central nervous system: Alert and oriented. No focal neurological deficits. Extremities: Symmetric 5 x 5 power. Skin: No rashes, lesions or ulcers Psychiatry: Judgement and insight appear normal. Mood & affect appropriate.   Data Reviewed: I have personally reviewed following labs and imaging studies  CBC: Recent Labs  Lab 11/22/20 2215 11/24/20 0701  WBC 9.4 8.1  NEUTROABS 8.0* 6.9  HGB 10.0* 9.0*   HCT 33.2* 29.8*  MCV 90.7 91.4  PLT 243 235    Basic Metabolic Panel: Recent Labs  Lab 11/22/20 2215 11/23/20 0536 11/24/20 0701  NA 135  --  139  K 2.8*  --  3.9  CL 98  --  103  CO2 27  --  26  GLUCOSE 121*  --  133*  BUN 6  --  7  CREATININE 0.69  --  0.46  CALCIUM 7.5*  --  7.7*  MG  --  2.8* 2.2  PHOS  --  2.4*  --     GFR: Estimated Creatinine Clearance: 78.2 mL/min (by C-G formula based on SCr of 0.46 mg/dL).  Liver Function Tests: Recent Labs  Lab 11/22/20 2215 11/24/20 0701  AST 31 48*  ALT 28 34  ALKPHOS 130* 124  BILITOT 0.5 0.6  PROT 6.3* 5.9*  ALBUMIN 3.0* 2.8*    CBG: No results for input(s): GLUCAP in the last 168 hours.  Recent Results (from the past 240 hour(s))  Resp Panel by RT-PCR (Flu A&B, Covid) Nasopharyngeal Swab     Status: None   Collection Time: 11/22/20 10:15 PM   Specimen: Nasopharyngeal Swab; Nasopharyngeal(NP) swabs in vial transport medium  Result Value Ref Range Status   SARS Coronavirus 2 by RT PCR NEGATIVE NEGATIVE Final    Comment: (NOTE) SARS-CoV-2 target nucleic acids are NOT DETECTED.  The SARS-CoV-2 RNA is generally detectable in upper respiratory specimens during the acute phase of infection. The lowest concentration of SARS-CoV-2 viral copies this assay can detect is 138 copies/mL. A negative result does not preclude SARS-Cov-2 infection and should not be used as the sole basis for treatment or other patient management decisions. A negative result may occur with  improper specimen collection/handling, submission of specimen other than nasopharyngeal swab, presence of viral mutation(s) within the areas targeted by this assay, and inadequate number of viral copies(<138 copies/mL). A negative result must be combined with clinical observations, patient history, and epidemiological information. The expected result is Negative.  Fact Sheet for Patients:  BloggerCourse.com  Fact Sheet for  Healthcare Providers:  SeriousBroker.it  This test is no t yet approved or cleared by the [place] FDA and  has been authorized for detection and/or diagnosis of SARS-CoV-2 by FDA under an Emergency Use Authorization (EUA). This EUA will remain  in effect (meaning this test can be used) for the duration of the COVID-19 declaration under Section 564(b)(1) of the Act, 21 U.S.C.section 360bbb-3(b)(1), unless the authorization is terminated  or revoked sooner.       Influenza A by PCR NEGATIVE NEGATIVE Final   Influenza B by PCR NEGATIVE NEGATIVE Final    Comment: (NOTE) The Xpert Xpress SARS-CoV-2/FLU/RSV plus assay is intended as an aid in the diagnosis of influenza from Nasopharyngeal swab specimens and should not be used as a sole basis for treatment. Nasal  washings and aspirates are unacceptable for Xpert Xpress SARS-CoV-2/FLU/RSV testing.  Fact Sheet for Patients: BloggerCourse.comhttps://www.fda.gov/media/152166/download  Fact Sheet for Healthcare Providers: SeriousBroker.ithttps://www.fda.gov/media/152162/download  This test is not yet approved or cleared by the [place] FDA and has been authorized for detection and/or diagnosis of SARS-CoV-2 by FDA under an Emergency Use Authorization (EUA). This EUA will remain in effect (meaning this test can be used) for the duration of the COVID-19 declaration under Section 564(b)(1) of the Act, 21 U.S.C. section 360bbb-3(b)(1), unless the authorization is terminated or revoked.  Performed at [facility], [address] [place], [place] [address]   Blood Culture (routine x 2)     Status: None (Preliminary result)   Collection Time: 11/22/20 11:06 PM   Specimen: BLOOD RIGHT ARM  Result Value Ref Range Status   Specimen Description BLOOD RIGHT ARM  Final   Special Requests   Final    BOTTLES DRAWN AEROBIC AND ANAEROBIC Blood Culture adequate volume   Culture   Final    NO GROWTH < 24 HOURS Performed at [facility], [address] [place], [place] [address]    Report Status PENDING  Incomplete  Blood Culture (routine x 2)     Status: None (Preliminary result)   Collection Time: 11/22/20 11:20 PM   Specimen: Left Antecubital; Blood  Result Value Ref Range Status   Specimen Description LEFT ANTECUBITAL  Final   Special Requests   Final    BOTTLES DRAWN AEROBIC AND ANAEROBIC Blood Culture adequate volume   Culture   Final    NO GROWTH < 24 HOURS Performed at [facility], [address] [place], [place] [address]    Report Status PENDING  Incomplete  Resp Panel by RT-PCR (Flu A&B, Covid) Nasopharyngeal Swab     Status: None   Collection Time: 11/23/20  8:54 AM   Specimen: Nasopharyngeal Swab; Nasopharyngeal(NP) swabs in vial transport medium  Result Value Ref Range Status   SARS Coronavirus 2 by RT PCR NEGATIVE NEGATIVE Final    Comment: (NOTE) SARS-CoV-2 target nucleic acids are NOT DETECTED.  The SARS-CoV-2 RNA is generally detectable in upper respiratory specimens during the acute phase of infection. The lowest concentration of SARS-CoV-2 viral copies this assay can detect is 138 copies/mL. A negative result does not preclude SARS-Cov-2 infection and should not be used as the sole basis for treatment or other patient management decisions. A negative result may occur with  improper specimen collection/handling, submission of specimen other than nasopharyngeal swab, presence of viral mutation(s) within the areas targeted by this assay, and inadequate number of viral copies(<138 copies/mL). A negative result must be combined with clinical observations, patient history, and epidemiological information. The expected result is Negative.  Fact Sheet for Patients:  BloggerCourse.comhttps://www.fda.gov/media/152166/download  Fact Sheet for Healthcare Providers:  SeriousBroker.ithttps://www.fda.gov/media/152162/download  This test is no t yet approved or cleared by the [place] FDA and  has been authorized for detection and/or  diagnosis of SARS-CoV-2 by FDA under an Emergency Use Authorization (EUA). This EUA will remain  in effect (meaning this test can be used) for the duration of the COVID-19 declaration under Section 564(b)(1) of the Act, 21 U.S.C.section 360bbb-3(b)(1), unless the authorization is terminated  or revoked sooner.       Influenza A by PCR NEGATIVE NEGATIVE Final   Influenza B by PCR NEGATIVE NEGATIVE Final    Comment: (NOTE) The Xpert Xpress SARS-CoV-2/FLU/RSV plus assay is intended as an aid in the diagnosis of influenza from Nasopharyngeal swab specimens  and should not be used as a sole basis for treatment. Nasal washings and aspirates are unacceptable for Xpert Xpress SARS-CoV-2/FLU/RSV testing.  Fact Sheet for Patients: BloggerCourse.com  Fact Sheet for Healthcare Providers: SeriousBroker.it  This test is not yet approved or cleared by the [place] FDA and has been authorized for detection and/or diagnosis of SARS-CoV-2 by FDA under an Emergency Use Authorization (EUA). This EUA will remain in effect (meaning this test can be used) for the duration of the COVID-19 declaration under Section 564(b)(1) of the Act, 21 U.S.C. section 360bbb-3(b)(1), unless the authorization is terminated or revoked.  Performed at [facility], [address] [place], [place] [address]      Radiology Studies: CT ANGIO CHEST PE W OR WO CONTRAST  Result Date: 11/23/2020 CLINICAL DATA:  Shortness of breath and left-sided chest pain with decreased oxygen saturation EXAM: CT ANGIOGRAPHY CHEST WITH CONTRAST TECHNIQUE: Multidetector CT imaging of the chest was performed using the standard protocol during bolus administration of intravenous contrast. Multiplanar CT image reconstructions and MIPs were obtained to evaluate the vascular anatomy. CONTRAST:  36mL OMNIPAQUE IOHEXOL 350 MG/ML SOLN COMPARISON:  Chest x-ray from the previous day. FINDINGS:  Cardiovascular: Thoracic aorta shows minimal atherosclerotic calcifications. No aneurysmal dilatation or dissection is noted. No cardiac enlargement is seen. The coronary arteries demonstrate mild calcification. Pulmonary artery is well visualized within normal branching pattern. No findings to suggest pulmonary emboli are noted. Mediastinum/Nodes: Thoracic inlet is within normal limits. Scattered hilar and subcarinal adenopathy is noted likely reactive in nature. The esophagus is within normal limits. Lungs/Pleura: Consolidation is noted in the right lower lobe as well as scattered predominately peripheral airspace opacities within both lungs. No sizable effusion is noted. No sizable parenchymal nodules are noted. Upper Abdomen: No acute abnormality noted. Musculoskeletal: No acute bony abnormality is noted Review of the MIP images confirms the above findings. IMPRESSION: Right lower lobe consolidation with patchy ground-glass airspace opacities bilaterally consistent with multifocal pneumonia. Correlate with COVID-19 testing. No evidence of pulmonary emboli. Aortic Atherosclerosis (ICD10-I70.0). Electronically Signed   By: Alcide Clever M.D.   On: 11/23/2020 09:09   DG Chest Portable 1 View  Result Date: 11/22/2020 CLINICAL DATA:  Shortness of breath, COPD. EXAM: PORTABLE CHEST 1 VIEW COMPARISON:  Chest x-ray 11/12/2019, CT chest 02/07/2020 FINDINGS: The heart size and mediastinal contours are within normal limits. Hyperinflation of the lungs consistent with emphysematous changes. Interval increase in interstitial markings diffusely but more prominent within the lower lobes. Hazy patchy bilateral lower lobe hazy airspace opacities. No pleural effusion. No pneumothorax. No acute osseous abnormality. IMPRESSION: Increased interstitial markings and hazy airspace opacities more prominent within the lower lobes. Findings likely represent infection/inflammation. COVID-19 infection not excluded. Followup PA and  lateral chest X-ray is recommended in 3-4 weeks following therapy to ensure resolution and exclude underlying malignancy. Electronically Signed   By: Tish Frederickson M.D.   On: 11/22/2020 21:40   Scheduled Meds:  citalopram  40 mg Oral Daily   enoxaparin (LOVENOX) injection  40 mg Subcutaneous Q24H   guaiFENesin  1,200 mg Oral BID   ipratropium-albuterol  3 mL Nebulization Q6H   nicotine  21 mg Transdermal Daily   pantoprazole  40 mg Oral Daily   potassium chloride  10 mEq Oral BID   predniSONE  40 mg Oral Q breakfast   pregabalin  200 mg Oral QHS   Continuous Infusions:  ceFEPime (MAXIPIME) IV Stopped (11/24/20 1044)   doxycycline (VIBRAMYCIN) IV 100 mg (11/24/20 1134)  LOS: 1 day   Critical Care Procedure Note Authorized and Performed by: Maryln Manuel MD  Total Critical Care time:  40 minutes  Due to a high probability of clinically significant, life threatening deterioration, the patient required my highest level of preparedness to intervene emergently and I personally spent this critical care time directly and personally managing the patient.  This critical care time included obtaining a history; examining the patient, pulse oximetry; ordering and review of studies; arranging urgent treatment with development of a management plan; evaluation of patient's response of treatment; frequent reassessment; and discussions with other providers.  This critical care time was performed to assess and manage the high probability of imminent and life threatening deterioration that could result in multi-organ failure.  It was exclusive of separately billable procedures and treating other patients and teaching time.   Standley Dakins, MD How to contact the [facility] Attending or Consulting provider 7A - 7P or covering provider during after hours 7P -7A, for this patient?  1. Check the care team in [facility] and look for a) attending/consulting [facility] provider listed and b) the [facility] team listed 2. Log  into [email or web address] and use [place]'s universal password to access. If you do not have the password, please contact the hospital operator. 3. Locate the [facility] provider you are looking for under [facility] and page to a number that you can be directly reached. 4. If you still have difficulty reaching the provider, please page the [facility] (Director on Call) for the Hospitalists listed on amion for assistance.  11/24/2020, 11:49 AM   '

## 2020-11-24 NOTE — ED Notes (Signed)
Cigarette case is in pt's purse in belongings bag on stretcher with pt's shirt,

## 2020-11-24 NOTE — Progress Notes (Signed)
Patient refused nebulizer treatment at 1408 stating "the doctor said I could use my Spiriva and Dulera from home." BBS noted to be diminished with faint expiratory wheeze and her RA SpO2 was 73%. 10L HFNC was applied.

## 2020-11-24 NOTE — ED Notes (Signed)
Pt denies pain at iv site, requests toiletries, toiletries given, pt reports decreased pain, pt states that she is just trying to sleep.  Pt remains on O2 sat monitor, and high flow.

## 2020-11-24 NOTE — Progress Notes (Addendum)
Patients oxygen decreased from 12 to 10 , Saturation 99 , Patient complaining about IV and being nauseated and sob. Also complained of needing pain rx. Nursing notified.

## 2020-11-24 NOTE — ED Notes (Signed)
Pt in bed with eyes closed, pt arouses easily to verbal stim, pt states that she d/c her abx because it was beeping, pt got aprox of abx doxy, reminded pt that she has a call bell on her bed rail and can call for any needs, pt verbalized understanding.

## 2020-11-25 ENCOUNTER — Inpatient Hospital Stay (HOSPITAL_COMMUNITY): Payer: Medicaid Other

## 2020-11-25 DIAGNOSIS — J9601 Acute respiratory failure with hypoxia: Secondary | ICD-10-CM | POA: Diagnosis not present

## 2020-11-25 DIAGNOSIS — J9621 Acute and chronic respiratory failure with hypoxia: Secondary | ICD-10-CM | POA: Diagnosis not present

## 2020-11-25 DIAGNOSIS — J441 Chronic obstructive pulmonary disease with (acute) exacerbation: Secondary | ICD-10-CM | POA: Diagnosis not present

## 2020-11-25 DIAGNOSIS — J189 Pneumonia, unspecified organism: Secondary | ICD-10-CM | POA: Diagnosis not present

## 2020-11-25 LAB — CBC WITH DIFFERENTIAL/PLATELET
Abs Immature Granulocytes: 0.2 10*3/uL — ABNORMAL HIGH (ref 0.00–0.07)
Basophils Absolute: 0 10*3/uL (ref 0.0–0.1)
Basophils Relative: 1 %
Eosinophils Absolute: 0 10*3/uL (ref 0.0–0.5)
Eosinophils Relative: 0 %
HCT: 32.3 % — ABNORMAL LOW (ref 36.0–46.0)
Hemoglobin: 9.4 g/dL — ABNORMAL LOW (ref 12.0–15.0)
Immature Granulocytes: 3 %
Lymphocytes Relative: 15 %
Lymphs Abs: 1.1 10*3/uL (ref 0.7–4.0)
MCH: 27.6 pg (ref 26.0–34.0)
MCHC: 29.1 g/dL — ABNORMAL LOW (ref 30.0–36.0)
MCV: 94.7 fL (ref 80.0–100.0)
Monocytes Absolute: 0.5 10*3/uL (ref 0.1–1.0)
Monocytes Relative: 7 %
Neutro Abs: 5.5 10*3/uL (ref 1.7–7.7)
Neutrophils Relative %: 74 %
Platelets: 228 10*3/uL (ref 150–400)
RBC: 3.41 MIL/uL — ABNORMAL LOW (ref 3.87–5.11)
RDW: 17.9 % — ABNORMAL HIGH (ref 11.5–15.5)
WBC: 7.3 10*3/uL (ref 4.0–10.5)
nRBC: 0 % (ref 0.0–0.2)

## 2020-11-25 LAB — COMPREHENSIVE METABOLIC PANEL
ALT: 31 U/L (ref 0–44)
AST: 35 U/L (ref 15–41)
Albumin: 2.6 g/dL — ABNORMAL LOW (ref 3.5–5.0)
Alkaline Phosphatase: 107 U/L (ref 38–126)
Anion gap: 10 (ref 5–15)
BUN: 9 mg/dL (ref 6–20)
CO2: 28 mmol/L (ref 22–32)
Calcium: 7.8 mg/dL — ABNORMAL LOW (ref 8.9–10.3)
Chloride: 103 mmol/L (ref 98–111)
Creatinine, Ser: 0.42 mg/dL — ABNORMAL LOW (ref 0.44–1.00)
GFR, Estimated: >60 mL/min (ref >60)
Glucose, Bld: 106 mg/dL — ABNORMAL HIGH (ref 70–99)
Potassium: 3.8 mmol/L (ref 3.5–5.1)
Sodium: 141 mmol/L (ref 135–145)
Total Bilirubin: 0.4 mg/dL (ref 0.3–1.2)
Total Protein: 5.7 g/dL — ABNORMAL LOW (ref 6.5–8.1)

## 2020-11-25 LAB — MRSA PCR SCREENING: MRSA by PCR: NEGATIVE

## 2020-11-25 LAB — LEGIONELLA PNEUMOPHILA SEROGP 1 UR AG: L. pneumophila Serogp 1 Ur Ag: NEGATIVE

## 2020-11-25 LAB — MAGNESIUM: Magnesium: 2.4 mg/dL (ref 1.7–2.4)

## 2020-11-25 MED ORDER — ADULT MULTIVITAMIN W/MINERALS CH
1.0000 | ORAL_TABLET | Freq: Every day | ORAL | Status: DC
  Administered 2020-11-25 – 2020-12-04 (×10): 1 via ORAL
  Filled 2020-11-25 (×10): qty 1

## 2020-11-25 MED ORDER — ENSURE ENLIVE PO LIQD
237.0000 mL | Freq: Two times a day (BID) | ORAL | Status: DC
  Administered 2020-11-25 – 2020-11-26 (×3): 237 mL via ORAL

## 2020-11-25 NOTE — Progress Notes (Addendum)
PROGRESS NOTE   Debra [name]  NWG:[id number] DOB: 03/18/1966 DOA: 11/22/2020 PCP: [name], MD   Chief Complaint  Patient presents with  . Shortness of Breath    Brief Admission History:  54 y/o female smoker with oxygen dependent COPD normal on 3L/m [place] at home reports progressive fever, cough, shortness of breath, sputum production and  Increasing oxygen requirement at home over last 3 days.  She was admitted with pneumonia, acute on chronic respiratory failure with hypoxia and has tested negative for covid 19 and influenza A/B.   Assessment & Plan:   Principal Problem:   Acute respiratory failure with hypoxia (HCC) Active Problems:   GERD (gastroesophageal reflux disease)   Hypokalemia   Mild protein malnutrition (HCC)   Normocytic anemia   Hyperlipidemia   COPD with acute exacerbation (HCC)   Tobacco use   CAP (community acquired pneumonia)   Acute on chronic respiratory failure with hypoxia (HCC)   1. Acute respiratory failure with hypoxia - secondary to a dense RLL pneumonia and bilateral multifocal pneumonia - continue cefepime, continue doxycycline, continue supportive measures and steroids, bronchodilators and follow cultures, strep pneumo negative and legionella testing is pending.  Pt had been demanding levofloxacin but with her history of QT prolongation did not agree with starting.  Pt was counseled about this and verbalized understanding.  Pt counseled about the dangers and risks of refusing nebulizer treatments.  2. COPD with acute exacerbation - mgmt as above. Unfortunately she has been refusing nebs, doses of steroids, etc.  Continue mucinex 1200 mg BID.  3. Tobacco - nicotine patch and cessation counseling given.  4. Hypokalemia - repleted.  5. Hyperlipidemia - pt refuses to take statin at this time.   DVT prophylaxis: enoxaparin  Code Status:  Full  Family Communication: plan carefully discussed with patient at length at bedside, queries were answered  and patient verbalized understanding Disposition: Home when medically stabilized  Status is: Inpatient   The patient will require care spanning > 2 midnights and should be moved to inpatient because: IV treatments appropriate due to intensity of illness or inability to take PO and Inpatient level of care appropriate due to severity of illness  Dispo: The patient is from: Home              Anticipated d/c is to: Home              Anticipated d/c date is: 2 days              Patient currently is not medically stable to d/c. Consultants:     Procedures:     Antimicrobials:  Cefepime 12/26 >> Doxycycline 12/25 >>   Subjective: Pt reports having a bad night with shortness of breath after she had refused her nebulizer treatments for most of day yesterday.  Cough remains unproductive.    Objective: Vitals:   11/25/20 0600 11/25/20 0700 11/25/20 0718 11/25/20 0802  BP: (!) 95/52 (!) 92/54  (!) 99/47  Pulse: 71 70 72 83  Resp: 19 19 16  (!) 21  Temp:   98.3 F (36.8 C)   TempSrc:   Oral   SpO2: 98% 99% 100% 98%  Weight:      Height:        Intake/Output Summary (Last 24 hours) at 11/25/2020 0914 Last data filed at 11/25/2020 0746 Gross per 24 hour  Intake 687.45 ml  Output --  Net 687.45 ml   Filed Weights   11/22/20 2137 11/24/20 1820 11/25/20  0500  Weight: 72.1 kg 75 kg 75 kg   Examination:  General exam: Appears calm and comfortable  Respiratory system: diffuse expiratory wheezing. Respiratory effort normal. Cardiovascular system: normal S1 & S2 heard. No JVD, murmurs, rubs, gallops or clicks. No pedal edema. Gastrointestinal system: Abdomen is nondistended, soft and nontender. No organomegaly or masses felt. Normal bowel sounds heard. Central nervous system: Alert and oriented. No focal neurological deficits. Extremities: Symmetric 5 x 5 power. Skin: No rashes, lesions or ulcers Psychiatry: Judgement and insight appear normal. Mood & affect appropriate.   Data  Reviewed: I have personally reviewed following labs and imaging studies  CBC: Recent Labs  Lab 11/22/20 2215 11/24/20 0701 11/25/20 0418  WBC 9.4 8.1 7.3  NEUTROABS 8.0* 6.9 5.5  HGB 10.0* 9.0* 9.4*  HCT 33.2* 29.8* 32.3*  MCV 90.7 91.4 94.7  PLT 243 235 228    Basic Metabolic Panel: Recent Labs  Lab 11/22/20 2215 11/23/20 0536 11/24/20 0701 11/25/20 0418  NA 135  --  139 141  K 2.8*  --  3.9 3.8  CL 98  --  103 103  CO2 27  --  26 28  GLUCOSE 121*  --  133* 106*  BUN 6  --  7 9  CREATININE 0.69  --  0.46 0.42*  CALCIUM 7.5*  --  7.7* 7.8*  MG  --  2.8* 2.2 2.4  PHOS  --  2.4*  --   --     GFR: Estimated Creatinine Clearance: 85 mL/min (A) (by C-G formula based on SCr of 0.42 mg/dL (L)).  Liver Function Tests: Recent Labs  Lab 11/22/20 2215 11/24/20 0701 11/25/20 0418  AST 31 48* 35  ALT 28 34 31  ALKPHOS 130* 124 107  BILITOT 0.5 0.6 0.4  PROT 6.3* 5.9* 5.7*  ALBUMIN 3.0* 2.8* 2.6*    CBG: No results for input(s): GLUCAP in the last 168 hours.  Recent Results (from the past 240 hour(s))  Resp Panel by RT-PCR (Flu A&B, Covid) Nasopharyngeal Swab     Status: None   Collection Time: 11/22/20 10:15 PM   Specimen: Nasopharyngeal Swab; Nasopharyngeal(NP) swabs in vial transport medium  Result Value Ref Range Status   SARS Coronavirus 2 by RT PCR NEGATIVE NEGATIVE Final    Comment: (NOTE) SARS-CoV-2 target nucleic acids are NOT DETECTED.  The SARS-CoV-2 RNA is generally detectable in upper respiratory specimens during the acute phase of infection. The lowest concentration of SARS-CoV-2 viral copies this assay can detect is 138 copies/mL. A negative result does not preclude SARS-Cov-2 infection and should not be used as the sole basis for treatment or other patient management decisions. A negative result may occur with  improper specimen collection/handling, submission of specimen other than nasopharyngeal swab, presence of viral mutation(s) within  the areas targeted by this assay, and inadequate number of viral copies(<138 copies/mL). A negative result must be combined with clinical observations, patient history, and epidemiological information. The expected result is Negative.  Fact Sheet for Patients:  BloggerCourse.comhttps://www.fda.gov/media/152166/download  Fact Sheet for Healthcare Providers:  SeriousBroker.ithttps://www.fda.gov/media/152162/download  This test is no t yet approved or cleared by the [place] FDA and  has been authorized for detection and/or diagnosis of SARS-CoV-2 by FDA under an Emergency Use Authorization (EUA). This EUA will remain  in effect (meaning this test can be used) for the duration of the COVID-19 declaration under Section 564(b)(1) of the Act, 21 U.S.C.section 360bbb-3(b)(1), unless the authorization is terminated  or revoked sooner.  Influenza A by PCR NEGATIVE NEGATIVE Final   Influenza B by PCR NEGATIVE NEGATIVE Final    Comment: (NOTE) The Xpert Xpress SARS-CoV-2/FLU/RSV plus assay is intended as an aid in the diagnosis of influenza from Nasopharyngeal swab specimens and should not be used as a sole basis for treatment. Nasal washings and aspirates are unacceptable for Xpert Xpress SARS-CoV-2/FLU/RSV testing.  Fact Sheet for Patients: BloggerCourse.com  Fact Sheet for Healthcare Providers: SeriousBroker.it  This test is not yet approved or cleared by the [place] FDA and has been authorized for detection and/or diagnosis of SARS-CoV-2 by FDA under an Emergency Use Authorization (EUA). This EUA will remain in effect (meaning this test can be used) for the duration of the COVID-19 declaration under Section 564(b)(1) of the Act, 21 U.S.C. section 360bbb-3(b)(1), unless the authorization is terminated or revoked.  Performed at [facility], [address] [place], [place] [address]   Blood Culture (routine x 2)     Status: None (Preliminary  result)   Collection Time: 11/22/20 11:06 PM   Specimen: BLOOD RIGHT ARM  Result Value Ref Range Status   Specimen Description BLOOD RIGHT ARM  Final   Special Requests   Final    BOTTLES DRAWN AEROBIC AND ANAEROBIC Blood Culture adequate volume   Culture   Final    NO GROWTH 3 DAYS Performed at [facility], [address] [place], [place] [address]    Report Status PENDING  Incomplete  Blood Culture (routine x 2)     Status: None (Preliminary result)   Collection Time: 11/22/20 11:20 PM   Specimen: Left Antecubital; Blood  Result Value Ref Range Status   Specimen Description LEFT ANTECUBITAL  Final   Special Requests   Final    BOTTLES DRAWN AEROBIC AND ANAEROBIC Blood Culture adequate volume   Culture   Final    NO GROWTH 3 DAYS Performed at [facility], [address] [place], [place] [address]    Report Status PENDING  Incomplete  Resp Panel by RT-PCR (Flu A&B, Covid) Nasopharyngeal Swab     Status: None   Collection Time: 11/23/20  8:54 AM   Specimen: Nasopharyngeal Swab; Nasopharyngeal(NP) swabs in vial transport medium  Result Value Ref Range Status   SARS Coronavirus 2 by RT PCR NEGATIVE NEGATIVE Final    Comment: (NOTE) SARS-CoV-2 target nucleic acids are NOT DETECTED.  The SARS-CoV-2 RNA is generally detectable in upper respiratory specimens during the acute phase of infection. The lowest concentration of SARS-CoV-2 viral copies this assay can detect is 138 copies/mL. A negative result does not preclude SARS-Cov-2 infection and should not be used as the sole basis for treatment or other patient management decisions. A negative result may occur with  improper specimen collection/handling, submission of specimen other than nasopharyngeal swab, presence of viral mutation(s) within the areas targeted by this assay, and inadequate number of viral copies(<138 copies/mL). A negative result must be combined with clinical observations, patient history, and  epidemiological information. The expected result is Negative.  Fact Sheet for Patients:  BloggerCourse.com  Fact Sheet for Healthcare Providers:  SeriousBroker.it  This test is no t yet approved or cleared by the [place] FDA and  has been authorized for detection and/or diagnosis of SARS-CoV-2 by FDA under an Emergency Use Authorization (EUA). This EUA will remain  in effect (meaning this test can be used) for the duration of the COVID-19 declaration under Section 564(b)(1) of the Act, 21 U.S.C.section 360bbb-3(b)(1), unless the authorization is terminated  or revoked sooner.       Influenza A by PCR NEGATIVE NEGATIVE Final   Influenza B by PCR NEGATIVE NEGATIVE Final    Comment: (NOTE) The Xpert Xpress SARS-CoV-2/FLU/RSV plus assay is intended as an aid in the diagnosis of influenza from Nasopharyngeal swab specimens and should not be used as a sole basis for treatment. Nasal washings and aspirates are unacceptable for Xpert Xpress SARS-CoV-2/FLU/RSV testing.  Fact Sheet for Patients: BloggerCourse.com  Fact Sheet for Healthcare Providers: SeriousBroker.it  This test is not yet approved or cleared by the [place] FDA and has been authorized for detection and/or diagnosis of SARS-CoV-2 by FDA under an Emergency Use Authorization (EUA). This EUA will remain in effect (meaning this test can be used) for the duration of the COVID-19 declaration under Section 564(b)(1) of the Act, 21 U.S.C. section 360bbb-3(b)(1), unless the authorization is terminated or revoked.  Performed at [facility], [address], [place], [place] [address]      Radiology Studies: No results found. Scheduled Meds: . Chlorhexidine Gluconate Cloth  6 each Topical Daily  . citalopram  40 mg Oral Daily  . enoxaparin (LOVENOX) injection  40 mg Subcutaneous Q24H  . guaiFENesin  1,200 mg Oral  BID  . ipratropium-albuterol  3 mL Nebulization Q6H  . nicotine  21 mg Transdermal Daily  . pantoprazole  40 mg Oral Daily  . potassium chloride  10 mEq Oral BID  . predniSONE  40 mg Oral Q breakfast  . pregabalin  200 mg Oral QHS   Continuous Infusions: . ceFEPime (MAXIPIME) IV 2 g (11/25/20 0839)  . doxycycline (VIBRAMYCIN) IV Stopped (11/25/20 0052)    LOS: 2 days   Critical Care Procedure Note Authorized and Performed by: Maryln Manuel MD  Total Critical Care time:  33 minutes  Due to a high probability of clinically significant, life threatening deterioration, the patient required my highest level of preparedness to intervene emergently and I personally spent this critical care time directly and personally managing the patient.  This critical care time included obtaining a history; examining the patient, pulse oximetry; ordering and review of studies; arranging urgent treatment with development of a management plan; evaluation of patient's response of treatment; frequent reassessment; and discussions with other providers.  This critical care time was performed to assess and manage the high probability of imminent and life threatening deterioration that could result in multi-organ failure.  It was exclusive of separately billable procedures and treating other patients and teaching time.   Standley Dakins, MD How to contact the [facility] Attending or Consulting provider 7A - 7P or covering provider during after hours 7P -7A, for this patient?  1. Check the care team in [facility] and look for a) attending/consulting [facility] provider listed and b) the [facility] team listed 2. Log into [email or web address] and use [place]'s universal password to access. If you do not have the password, please contact the hospital operator. 3. Locate the [facility] provider you are looking for under [facility] and page to a number that you can be directly reached. 4. If you still have difficulty reaching the provider, please page the  [facility] (Director on Call) for the Hospitalists listed on amion for assistance.  11/25/2020, 9:14 AM   '

## 2020-11-25 NOTE — Progress Notes (Signed)
Initial Nutrition Assessment  DOCUMENTATION CODES:   Not applicable  INTERVENTION: Ensure Enlive po BID, each supplement provides 350 kcal and 20 grams of protein  MVI with minerals po daily  NUTRITION DIAGNOSIS:   Increased nutrient needs related to acute illness,chronic illness (acute respiratory failure secondary to pneumonia; COPD) as evidenced by estimated needs.    GOAL:   Patient will meet greater than or equal to 90% of their needs    MONITOR:   Labs,I & O's,Supplement acceptance,PO intake,Weight trends  REASON FOR ASSESSMENT:   Consult Assessment of nutrition requirement/status  ASSESSMENT:   54 year old female admitted with acute respiratory failure with hypoxia secondary to pneumonia. Past medical history significant of COPD on 3 L oxygen at home, GERD, HLD, hiatal hernia, tobacco abuse, chronic back pain, arthritis, depression and anxiety.  RD working remotely.  Patient reports having a bad night with shortness of breath after refusing nebulizer treatments most of yesterday. Nursing reports patient continuously complaining she cannot breathe today, O2 sats in high 90s on 10 L HFNC.   No documented meals at this time for review. Per chart, patient requesting to rest when lunch tray delivered yesterday. Suspect decreased po intake given ongoing shortness of breath. Will order Ensure supplements as well as daily MVI to help her meet her needs.   I/Os: +2219.8 ml since admit  Per chart weights have trended down ~26 lbs (13.8%) over the last 11 months; insignificant.  Medications reviewed and include: Celexa, Mucinex, Duoneb, Protonix, Klor-con, Prednisone, Lyrica, Maxipime, Doxycycline  Labs: Cr 0.42 (L), Hgb 9.4 (L), HCT 32.3 (L)   NUTRITION - FOCUSED PHYSICAL EXAM: Unable to complete at this time   Diet Order:   Diet Order            Diet Heart Room service appropriate? Yes; Fluid consistency: Thin  Diet effective now                 EDUCATION  NEEDS:   No education needs have been identified at this time  Skin:  Skin Assessment: Skin Integrity Issues: Skin Integrity Issues:: Other (Comment) Other: ecchymosis; left hip  Last BM:  12/26  Height:   Ht Readings from Last 1 Encounters:  11/24/20 5\' 7"  (1.702 m)    Weight:   Wt Readings from Last 1 Encounters:  11/25/20 75 kg    BMI:  Body mass index is 25.9 kg/m.  Estimated Nutritional Needs:   Kcal:  1900-2100  Protein:  100-120  Fluid:  > 1.9 L   11/27/20, RD, LDN Clinical Nutrition After Hours/Weekend Pager # in Amion

## 2020-11-25 NOTE — Progress Notes (Signed)
Patient was found out of bed by NT. NT and this RN went into patient's room and educated her not to get out of bed. Patient stated she couldn't breathe and had to use the bathroom. Patient had purewick on to use without having to get up. NT got patient up to the chair with the chair alarm and instructed patient not to get up unless staff was in the room to assist patient and that if patient needed to get up then to use the call bell. As soon as Charity fundraiser and NT left patient's room, patient got up out of the chair by herself and chair alarm went off. Patient was instructed again not to get up. Patient continuously says she cannot breathe. [place] sats are in the higher 90s on 10L HFNC. Respiratory on call if needed. Otherwise, patient's sats are stable.

## 2020-11-26 DIAGNOSIS — J9601 Acute respiratory failure with hypoxia: Secondary | ICD-10-CM | POA: Diagnosis not present

## 2020-11-26 DIAGNOSIS — J441 Chronic obstructive pulmonary disease with (acute) exacerbation: Secondary | ICD-10-CM | POA: Diagnosis not present

## 2020-11-26 DIAGNOSIS — J181 Lobar pneumonia, unspecified organism: Principal | ICD-10-CM

## 2020-11-26 DIAGNOSIS — E876 Hypokalemia: Secondary | ICD-10-CM | POA: Diagnosis not present

## 2020-11-26 LAB — CBC WITH DIFFERENTIAL/PLATELET
Abs Immature Granulocytes: 0.18 10*3/uL — ABNORMAL HIGH (ref 0.00–0.07)
Basophils Absolute: 0 10*3/uL (ref 0.0–0.1)
Basophils Relative: 0 %
Eosinophils Absolute: 0 10*3/uL (ref 0.0–0.5)
Eosinophils Relative: 0 %
HCT: 31.6 % — ABNORMAL LOW (ref 36.0–46.0)
Hemoglobin: 9.4 g/dL — ABNORMAL LOW (ref 12.0–15.0)
Immature Granulocytes: 4 %
Lymphocytes Relative: 30 %
Lymphs Abs: 1.5 10*3/uL (ref 0.7–4.0)
MCH: 27.8 pg (ref 26.0–34.0)
MCHC: 29.7 g/dL — ABNORMAL LOW (ref 30.0–36.0)
MCV: 93.5 fL (ref 80.0–100.0)
Monocytes Absolute: 0.7 10*3/uL (ref 0.1–1.0)
Monocytes Relative: 15 %
Neutro Abs: 2.6 10*3/uL (ref 1.7–7.7)
Neutrophils Relative %: 51 %
Platelets: 239 10*3/uL (ref 150–400)
RBC: 3.38 MIL/uL — ABNORMAL LOW (ref 3.87–5.11)
RDW: 17.7 % — ABNORMAL HIGH (ref 11.5–15.5)
WBC: 5 10*3/uL (ref 4.0–10.5)
nRBC: 0 % (ref 0.0–0.2)

## 2020-11-26 LAB — COMPREHENSIVE METABOLIC PANEL
ALT: 28 U/L (ref 0–44)
AST: 34 U/L (ref 15–41)
Albumin: 2.6 g/dL — ABNORMAL LOW (ref 3.5–5.0)
Alkaline Phosphatase: 106 U/L (ref 38–126)
Anion gap: 9 (ref 5–15)
BUN: 9 mg/dL (ref 6–20)
CO2: 29 mmol/L (ref 22–32)
Calcium: 7.9 mg/dL — ABNORMAL LOW (ref 8.9–10.3)
Chloride: 102 mmol/L (ref 98–111)
Creatinine, Ser: 0.44 mg/dL (ref 0.44–1.00)
GFR, Estimated: >60 mL/min (ref >60)
Glucose, Bld: 87 mg/dL (ref 70–99)
Potassium: 3.5 mmol/L (ref 3.5–5.1)
Sodium: 140 mmol/L (ref 135–145)
Total Bilirubin: 0.6 mg/dL (ref 0.3–1.2)
Total Protein: 5.8 g/dL — ABNORMAL LOW (ref 6.5–8.1)

## 2020-11-26 LAB — MAGNESIUM: Magnesium: 2.3 mg/dL (ref 1.7–2.4)

## 2020-11-26 MED ORDER — METHYLPREDNISOLONE SODIUM SUCC 40 MG IJ SOLR
40.0000 mg | Freq: Two times a day (BID) | INTRAMUSCULAR | Status: AC
  Administered 2020-11-26 – 2020-12-02 (×13): 40 mg via INTRAVENOUS
  Filled 2020-11-26 (×13): qty 1

## 2020-11-26 MED ORDER — BUDESONIDE 0.5 MG/2ML IN SUSP
0.5000 mg | Freq: Two times a day (BID) | RESPIRATORY_TRACT | Status: DC
  Administered 2020-11-26 – 2020-12-04 (×14): 0.5 mg via RESPIRATORY_TRACT
  Filled 2020-11-26 (×18): qty 2

## 2020-11-26 MED ORDER — DOXYCYCLINE HYCLATE 100 MG PO TABS
100.0000 mg | ORAL_TABLET | Freq: Two times a day (BID) | ORAL | Status: AC
  Administered 2020-11-27 – 2020-12-01 (×11): 100 mg via ORAL
  Filled 2020-11-26 (×10): qty 1

## 2020-11-26 MED ORDER — SALINE SPRAY 0.65 % NA SOLN
1.0000 | NASAL | Status: DC | PRN
  Administered 2020-11-26: 05:00:00 1 via NASAL
  Filled 2020-11-26 (×2): qty 44

## 2020-11-26 NOTE — Progress Notes (Signed)
PROGRESS NOTE  Debra [name] ZOX:[id number] DOB: 1966-03-16 DOA: 11/22/2020 PCP: [name], MD  Brief History:  54 y.o. female with medical history significant of anxiety, panic attacks, depression, osteoarthritis, chronic lower back pain, COPD/active smoker, GERD, history of hiatal hernia, history of bleeding ulcers, hypokalemia, history of pneumonia who is coming to the emergency department with complaints of fever and progressively worse dyspnea for the past 3 days.  All other associated symptoms are occasionally productive of whitish sputum cough, fatigue, malaise, wheezing and headache.  No travel history or sick contacts.  No significant relief by medications at home.  She has doubled her O2 requirement from 3 LPM to 6 LPM with some improvement.  She mentions that she is supposed to use her oxygen at night, but for the past 3 days she has been using it during daytime as well.  ED Course: Initial vital signs were temperature 101.5 F, pulse 118, respirations 22, BP 104/86 mmHg and O2 sat 87% on 6 LPM via nasal cannula.  The patient is currently on 10 LPM via HFNC.  He received acetaminophen at 1000 mg p.o. x1, a 1000 mL NS bolus, 8 mg of dexamethasone IVP x1 and doxycycline 100 mg IVPB every 12 hours was started.  Labwork: Coronavirus 2 and influenza PCR was negative.  CBC showed a white count of 9.4 with 85% neutrophils, hemoglobin 10.0 g/dL and platelets 811.  Lactic acid was normal.  CMP showed normal sodium, chloride and CO2, but potassium level was 2.9 mmol/L.  Glucose 121 and calcium 7.5 mg/dL.  Renal function was normal.  Total protein 6.3, albumin 3.0 g/dL.  Alkaline phosphatase 130 units/L.  AST, ALT and total bilirubin were normal.  Assessment/Plan: Acute on chronic Respiratory Failure with Hypoxia -due to pneumonia and COPD exacerbation -chronically on 3L at home at night -12/26 CTA chest--no PE; RLL consolidation with bilateral patchy GGO -currently on 6L -wean  oxygen for saturation >90%  Lobar Pnuemonia -continue cefepime and doxy -repeat PCT in am  COPD exacerbation -continue duonebs -add pulmicort -restart IV solumedrol -continue mucinex -IS, flutter valve  Anxiety -continue home dose klonopin 5 mg q 6 hrs prn anxiety -PMP Aware reviewed -continue celexa  Chronic pain syndrome -continue home dose percocet 7.5/325 and pregabaline  Hypokalemia -replete -check mag  Hyperlipidemia -noncompliant with statin  Tobacco abuse -cessation discussed -nicoderm patch      Status is: Inpatient  Remains inpatient appropriate because:IV treatments appropriate due to intensity of illness or inability to take PO   Dispo: The patient is from: Home              Anticipated d/c is BJ:YNWG              Anticipated d/c date is: 11/28/20              Patient currently Not medically stable.  More sob today        Family Communication:  no Family at bedside  Consultants:  none  Code Status:  FULL /  DVT Prophylaxis:  [place] Heparin / [place] Lovenox   Procedures: As Listed in Progress Note Above  Antibiotics: Cefepime 12/26>> Doxy 12/26>>    Subjective: Patient complains of more sob today, particularly with exertion.  Denies f/c, cp, n/v/d, abd pain  Objective: Vitals:   11/26/20 0900 11/26/20 1000 11/26/20 1100 11/26/20 1137  BP: 135/63 119/65 119/72   Pulse: 93 73 70   Resp: (!) 27 15  13   Temp:    97.8 F (36.6 C)  TempSrc:    Oral  SpO2: 94% 97% 96%   Weight:      Height:        Intake/Output Summary (Last 24 hours) at 11/26/2020 1155 Last data filed at 11/26/2020 0300 Gross per 24 hour  Intake 419.52 ml  Output 900 ml  Net -480.48 ml   Weight change:  Exam:   General:  Pt is alert, follows commands appropriately, not in acute distress  HEENT: No icterus, No thrush, No neck mass, [place]/AT  Cardiovascular: RRR, S1/S2, no rubs, no gallops  Respiratory: bilateral rales.  Minimal basilar wheeze  Abdomen:  Soft/+BS, non tender, non distended, no guarding  Extremities: No edema, No lymphangitis, No petechiae, No rashes, no synovitis   Data Reviewed: I have personally reviewed following labs and imaging studies Basic Metabolic Panel: Recent Labs  Lab 11/22/20 2215 11/23/20 0536 11/24/20 0701 11/25/20 0418 11/26/20 0235  NA 135  --  139 141 140  K 2.8*  --  3.9 3.8 3.5  CL 98  --  103 103 102  CO2 27  --  26 28 29   GLUCOSE 121*  --  133* 106* 87  BUN 6  --  7 9 9   CREATININE 0.69  --  0.46 0.42* 0.44  CALCIUM 7.5*  --  7.7* 7.8* 7.9*  MG  --  2.8* 2.2 2.4 2.3  PHOS  --  2.4*  --   --   --    Liver Function Tests: Recent Labs  Lab 11/22/20 2215 11/24/20 0701 11/25/20 0418 11/26/20 0235  AST 31 48* 35 34  ALT 28 34 31 28  ALKPHOS 130* 124 107 106  BILITOT 0.5 0.6 0.4 0.6  PROT 6.3* 5.9* 5.7* 5.8*  ALBUMIN 3.0* 2.8* 2.6* 2.6*   No results for input(s): LIPASE, AMYLASE in the last 168 hours. No results for input(s): AMMONIA in the last 168 hours. Coagulation Profile: No results for input(s): INR, PROTIME in the last 168 hours. CBC: Recent Labs  Lab 11/22/20 2215 11/24/20 0701 11/25/20 0418 11/26/20 0235  WBC 9.4 8.1 7.3 5.0  NEUTROABS 8.0* 6.9 5.5 2.6  HGB 10.0* 9.0* 9.4* 9.4*  HCT 33.2* 29.8* 32.3* 31.6*  MCV 90.7 91.4 94.7 93.5  PLT 243 235 228 239   Cardiac Enzymes: No results for input(s): CKTOTAL, CKMB, CKMBINDEX, TROPONINI in the last 168 hours. BNP: Invalid input(s): POCBNP CBG: No results for input(s): GLUCAP in the last 168 hours. HbA1C: No results for input(s): HGBA1C in the last 72 hours. Urine analysis:    Component Value Date/Time   COLORURINE YELLOW 07/12/2017 1455   APPEARANCEUR CLEAR 07/12/2017 1455   LABSPEC 1.005 07/12/2017 1455   PHURINE 8.0 07/12/2017 1455   GLUCOSEU NEGATIVE 07/12/2017 1455   HGBUR NEGATIVE 07/12/2017 1455   BILIRUBINUR NEGATIVE 07/12/2017 1455   KETONESUR 5 (A) 07/12/2017 1455   PROTEINUR NEGATIVE 07/12/2017  1455   NITRITE NEGATIVE 07/12/2017 1455   LEUKOCYTESUR NEGATIVE 07/12/2017 1455   Sepsis Labs: @LABRCNTIP (procalcitonin:4,lacticidven:4) ) Recent Results (from the past 240 hour(s))  Resp Panel by RT-PCR (Flu A&B, Covid) Nasopharyngeal Swab     Status: None   Collection Time: 11/22/20 10:15 PM   Specimen: Nasopharyngeal Swab; Nasopharyngeal(NP) swabs in vial transport medium  Result Value Ref Range Status   SARS Coronavirus 2 by RT PCR NEGATIVE NEGATIVE Final    Comment: (NOTE) SARS-CoV-2 target nucleic acids are NOT DETECTED.  The SARS-CoV-2 RNA is  generally detectable in upper respiratory specimens during the acute phase of infection. The lowest concentration of SARS-CoV-2 viral copies this assay can detect is 138 copies/mL. A negative result does not preclude SARS-Cov-2 infection and should not be used as the sole basis for treatment or other patient management decisions. A negative result may occur with  improper specimen collection/handling, submission of specimen other than nasopharyngeal swab, presence of viral mutation(s) within the areas targeted by this assay, and inadequate number of viral copies(<138 copies/mL). A negative result must be combined with clinical observations, patient history, and epidemiological information. The expected result is Negative.  Fact Sheet for Patients:  BloggerCourse.com  Fact Sheet for Healthcare Providers:  SeriousBroker.it  This test is no t yet approved or cleared by the [place] FDA and  has been authorized for detection and/or diagnosis of SARS-CoV-2 by FDA under an Emergency Use Authorization (EUA). This EUA will remain  in effect (meaning this test can be used) for the duration of the COVID-19 declaration under Section 564(b)(1) of the Act, 21 U.S.C.section 360bbb-3(b)(1), unless the authorization is terminated  or revoked sooner.       Influenza A by PCR NEGATIVE  NEGATIVE Final   Influenza B by PCR NEGATIVE NEGATIVE Final    Comment: (NOTE) The Xpert Xpress SARS-CoV-2/FLU/RSV plus assay is intended as an aid in the diagnosis of influenza from Nasopharyngeal swab specimens and should not be used as a sole basis for treatment. Nasal washings and aspirates are unacceptable for Xpert Xpress SARS-CoV-2/FLU/RSV testing.  Fact Sheet for Patients: BloggerCourse.com  Fact Sheet for Healthcare Providers: SeriousBroker.it  This test is not yet approved or cleared by the [place] FDA and has been authorized for detection and/or diagnosis of SARS-CoV-2 by FDA under an Emergency Use Authorization (EUA). This EUA will remain in effect (meaning this test can be used) for the duration of the COVID-19 declaration under Section 564(b)(1) of the Act, 21 U.S.C. section 360bbb-3(b)(1), unless the authorization is terminated or revoked.  Performed at [facility], [address] [place], [place] [address]   Blood Culture (routine x 2)     Status: None (Preliminary result)   Collection Time: 11/22/20 11:06 PM   Specimen: BLOOD RIGHT ARM  Result Value Ref Range Status   Specimen Description BLOOD RIGHT ARM  Final   Special Requests   Final    BOTTLES DRAWN AEROBIC AND ANAEROBIC Blood Culture adequate volume   Culture   Final    NO GROWTH 3 DAYS Performed at [facility], [address], [place], [place] [address]    Report Status PENDING  Incomplete  Blood Culture (routine x 2)     Status: None (Preliminary result)   Collection Time: 11/22/20 11:20 PM   Specimen: Left Antecubital; Blood  Result Value Ref Range Status   Specimen Description LEFT ANTECUBITAL  Final   Special Requests   Final    BOTTLES DRAWN AEROBIC AND ANAEROBIC Blood Culture adequate volume   Culture   Final    NO GROWTH 3 DAYS Performed at [facility], [address] [place], [place] [address]    Report Status PENDING  Incomplete   Resp Panel by RT-PCR (Flu A&B, Covid) Nasopharyngeal Swab     Status: None   Collection Time: 11/23/20  8:54 AM   Specimen: Nasopharyngeal Swab; Nasopharyngeal(NP) swabs in vial transport medium  Result Value Ref Range Status   SARS Coronavirus 2 by RT PCR NEGATIVE NEGATIVE Final    Comment: (NOTE) SARS-CoV-2 target  nucleic acids are NOT DETECTED.  The SARS-CoV-2 RNA is generally detectable in upper respiratory specimens during the acute phase of infection. The lowest concentration of SARS-CoV-2 viral copies this assay can detect is 138 copies/mL. A negative result does not preclude SARS-Cov-2 infection and should not be used as the sole basis for treatment or other patient management decisions. A negative result may occur with  improper specimen collection/handling, submission of specimen other than nasopharyngeal swab, presence of viral mutation(s) within the areas targeted by this assay, and inadequate number of viral copies(<138 copies/mL). A negative result must be combined with clinical observations, patient history, and epidemiological information. The expected result is Negative.  Fact Sheet for Patients:  BloggerCourse.com  Fact Sheet for Healthcare Providers:  SeriousBroker.it  This test is no t yet approved or cleared by the [place] FDA and  has been authorized for detection and/or diagnosis of SARS-CoV-2 by FDA under an Emergency Use Authorization (EUA). This EUA will remain  in effect (meaning this test can be used) for the duration of the COVID-19 declaration under Section 564(b)(1) of the Act, 21 U.S.C.section 360bbb-3(b)(1), unless the authorization is terminated  or revoked sooner.       Influenza A by PCR NEGATIVE NEGATIVE Final   Influenza B by PCR NEGATIVE NEGATIVE Final    Comment: (NOTE) The Xpert Xpress SARS-CoV-2/FLU/RSV plus assay is intended as an aid in the diagnosis of influenza from  Nasopharyngeal swab specimens and should not be used as a sole basis for treatment. Nasal washings and aspirates are unacceptable for Xpert Xpress SARS-CoV-2/FLU/RSV testing.  Fact Sheet for Patients: BloggerCourse.com  Fact Sheet for Healthcare Providers: SeriousBroker.it  This test is not yet approved or cleared by the [place] FDA and has been authorized for detection and/or diagnosis of SARS-CoV-2 by FDA under an Emergency Use Authorization (EUA). This EUA will remain in effect (meaning this test can be used) for the duration of the COVID-19 declaration under Section 564(b)(1) of the Act, 21 U.S.C. section 360bbb-3(b)(1), unless the authorization is terminated or revoked.  Performed at [facility], [address], [place], [place] [address]   MRSA PCR Screening     Status: None   Collection Time: 11/23/20  9:22 AM   Specimen: Nasal Mucosa; Nasopharyngeal  Result Value Ref Range Status   MRSA by PCR NEGATIVE NEGATIVE Final    Comment:        The GeneXpert MRSA Assay (FDA approved for NASAL specimens only), is one component of a comprehensive MRSA colonization surveillance program. It is not intended to diagnose MRSA infection nor to guide or monitor treatment for MRSA infections. Performed at [facility], [address], [place], [place] [address]      Scheduled Meds: . Chlorhexidine Gluconate Cloth  6 each Topical Daily  . citalopram  40 mg Oral Daily  . enoxaparin (LOVENOX) injection  40 mg Subcutaneous Q24H  . feeding supplement  237 mL Oral BID BM  . guaiFENesin  1,200 mg Oral BID  . ipratropium-albuterol  3 mL Nebulization Q6H  . multivitamin with minerals  1 tablet Oral Daily  . nicotine  21 mg Transdermal Daily  . pantoprazole  40 mg Oral Daily  . potassium chloride  10 mEq Oral BID  . predniSONE  40 mg Oral Q breakfast  . pregabalin  200 mg Oral QHS   Continuous Infusions: . ceFEPime (MAXIPIME) IV  2 g (11/26/20 0809)  . doxycycline (VIBRAMYCIN) IV 100 mg (11/26/20 1022)    Procedures/Studies:  CT ANGIO CHEST PE W OR WO CONTRAST  Result Date: 11/23/2020 CLINICAL DATA:  Shortness of breath and left-sided chest pain with decreased oxygen saturation EXAM: CT ANGIOGRAPHY CHEST WITH CONTRAST TECHNIQUE: Multidetector CT imaging of the chest was performed using the standard protocol during bolus administration of intravenous contrast. Multiplanar CT image reconstructions and MIPs were obtained to evaluate the vascular anatomy. CONTRAST:  83mL OMNIPAQUE IOHEXOL 350 MG/ML SOLN COMPARISON:  Chest x-ray from the previous day. FINDINGS: Cardiovascular: Thoracic aorta shows minimal atherosclerotic calcifications. No aneurysmal dilatation or dissection is noted. No cardiac enlargement is seen. The coronary arteries demonstrate mild calcification. Pulmonary artery is well visualized within normal branching pattern. No findings to suggest pulmonary emboli are noted. Mediastinum/Nodes: Thoracic inlet is within normal limits. Scattered hilar and subcarinal adenopathy is noted likely reactive in nature. The esophagus is within normal limits. Lungs/Pleura: Consolidation is noted in the right lower lobe as well as scattered predominately peripheral airspace opacities within both lungs. No sizable effusion is noted. No sizable parenchymal nodules are noted. Upper Abdomen: No acute abnormality noted. Musculoskeletal: No acute bony abnormality is noted Review of the MIP images confirms the above findings. IMPRESSION: Right lower lobe consolidation with patchy ground-glass airspace opacities bilaterally consistent with multifocal pneumonia. Correlate with COVID-19 testing. No evidence of pulmonary emboli. Aortic Atherosclerosis (ICD10-I70.0). Electronically Signed   By: Alcide Clever M.D.   On: 11/23/2020 09:09   DG CHEST PORT 1 VIEW  Result Date: 11/25/2020 CLINICAL DATA:  Shortness of breath.  History of COPD. EXAM:  PORTABLE CHEST 1 VIEW COMPARISON:  Single-view of the chest 11/22/2020. PA and lateral chest 08/30/2018. CT chest 11/23/2020. FINDINGS: The lungs are emphysematous. Patchy bilateral airspace disease most compatible with pneumonia has progressed with new opacities seen in the upper lung zones bilaterally. No pneumothorax or pleural fluid. Heart size is normal. Aortic atherosclerosis. IMPRESSION: Patchy bilateral airspace disease compatible with plain film pneumonia has worsened since the most recent plain film comparison. Aortic Atherosclerosis (ICD10-I70.0) and Emphysema (ICD10-J43.9). Electronically Signed   By: Drusilla Kanner M.D.   On: 11/25/2020 10:23   DG Chest Portable 1 View  Result Date: 11/22/2020 CLINICAL DATA:  Shortness of breath, COPD. EXAM: PORTABLE CHEST 1 VIEW COMPARISON:  Chest x-ray 11/12/2019, CT chest 02/07/2020 FINDINGS: The heart size and mediastinal contours are within normal limits. Hyperinflation of the lungs consistent with emphysematous changes. Interval increase in interstitial markings diffusely but more prominent within the lower lobes. Hazy patchy bilateral lower lobe hazy airspace opacities. No pleural effusion. No pneumothorax. No acute osseous abnormality. IMPRESSION: Increased interstitial markings and hazy airspace opacities more prominent within the lower lobes. Findings likely represent infection/inflammation. COVID-19 infection not excluded. Followup PA and lateral chest X-ray is recommended in 3-4 weeks following therapy to ensure resolution and exclude underlying malignancy. Electronically Signed   By: Tish Frederickson M.D.   On: 11/22/2020 21:40    Catarina Hartshorn, DO  [facility]  If 7PM-7AM, please contact night-coverage [email or web address] Password [email or web address] 11/26/2020, 11:55 AM   LOS: 3 days

## 2020-11-26 NOTE — Progress Notes (Signed)
Patient continuously works herself up into an anxiety attack saying she cannot breathe. Dr Tat is aware and PRN valium given to patient. Patient is educated on how to breathe in through her nose and out through her mouth. Oxygen sats are greater than 90%. Patient's oxygen has been brought down to 4 Liters without patient knowing due to anxiety about lack of oxygen and inability to breathe. Primary RN and this RN at bedside consoling patient.

## 2020-11-26 NOTE — Progress Notes (Signed)
Pt is now on heated high flow by RT.

## 2020-11-27 ENCOUNTER — Inpatient Hospital Stay (HOSPITAL_COMMUNITY): Payer: Medicaid Other

## 2020-11-27 DIAGNOSIS — J189 Pneumonia, unspecified organism: Secondary | ICD-10-CM | POA: Diagnosis not present

## 2020-11-27 DIAGNOSIS — J9601 Acute respiratory failure with hypoxia: Secondary | ICD-10-CM

## 2020-11-27 DIAGNOSIS — J441 Chronic obstructive pulmonary disease with (acute) exacerbation: Secondary | ICD-10-CM | POA: Diagnosis not present

## 2020-11-27 LAB — BLOOD GAS, ARTERIAL
Acid-Base Excess: 4.5 mmol/L — ABNORMAL HIGH (ref 0.0–2.0)
Bicarbonate: 28.7 mmol/L — ABNORMAL HIGH (ref 20.0–28.0)
FIO2: 80
O2 Saturation: 96.1 %
Patient temperature: 36.7
pCO2 arterial: 35.8 mmHg (ref 32.0–48.0)
pH, Arterial: 7.501 — ABNORMAL HIGH (ref 7.350–7.450)
pO2, Arterial: 74.3 mmHg — ABNORMAL LOW (ref 83.0–108.0)

## 2020-11-27 LAB — CBC WITH DIFFERENTIAL/PLATELET
Abs Immature Granulocytes: 0.29 10*3/uL — ABNORMAL HIGH (ref 0.00–0.07)
Basophils Absolute: 0 10*3/uL (ref 0.0–0.1)
Basophils Relative: 0 %
Eosinophils Absolute: 0 10*3/uL (ref 0.0–0.5)
Eosinophils Relative: 0 %
HCT: 30.1 % — ABNORMAL LOW (ref 36.0–46.0)
Hemoglobin: 9.1 g/dL — ABNORMAL LOW (ref 12.0–15.0)
Immature Granulocytes: 5 %
Lymphocytes Relative: 18 %
Lymphs Abs: 1 10*3/uL (ref 0.7–4.0)
MCH: 27.6 pg (ref 26.0–34.0)
MCHC: 30.2 g/dL (ref 30.0–36.0)
MCV: 91.2 fL (ref 80.0–100.0)
Monocytes Absolute: 0.5 10*3/uL (ref 0.1–1.0)
Monocytes Relative: 9 %
Neutro Abs: 3.6 10*3/uL (ref 1.7–7.7)
Neutrophils Relative %: 68 %
Platelets: 331 10*3/uL (ref 150–400)
RBC: 3.3 MIL/uL — ABNORMAL LOW (ref 3.87–5.11)
RDW: 17.4 % — ABNORMAL HIGH (ref 11.5–15.5)
WBC: 5.3 10*3/uL (ref 4.0–10.5)
nRBC: 0 % (ref 0.0–0.2)

## 2020-11-27 LAB — CULTURE, BLOOD (ROUTINE X 2)
Culture: NO GROWTH
Culture: NO GROWTH
Special Requests: ADEQUATE
Special Requests: ADEQUATE

## 2020-11-27 LAB — MAGNESIUM: Magnesium: 2.2 mg/dL (ref 1.7–2.4)

## 2020-11-27 LAB — COMPREHENSIVE METABOLIC PANEL
ALT: 27 U/L (ref 0–44)
AST: 24 U/L (ref 15–41)
Albumin: 2.6 g/dL — ABNORMAL LOW (ref 3.5–5.0)
Alkaline Phosphatase: 91 U/L (ref 38–126)
Anion gap: 12 (ref 5–15)
BUN: 12 mg/dL (ref 6–20)
CO2: 28 mmol/L (ref 22–32)
Calcium: 8.1 mg/dL — ABNORMAL LOW (ref 8.9–10.3)
Chloride: 101 mmol/L (ref 98–111)
Creatinine, Ser: 0.39 mg/dL — ABNORMAL LOW (ref 0.44–1.00)
GFR, Estimated: >60 mL/min (ref >60)
Glucose, Bld: 123 mg/dL — ABNORMAL HIGH (ref 70–99)
Potassium: 4.3 mmol/L (ref 3.5–5.1)
Sodium: 141 mmol/L (ref 135–145)
Total Bilirubin: 0.7 mg/dL (ref 0.3–1.2)
Total Protein: 5.8 g/dL — ABNORMAL LOW (ref 6.5–8.1)

## 2020-11-27 LAB — PROCALCITONIN: Procalcitonin: 0.17 ng/mL

## 2020-11-27 MED ORDER — REVEFENACIN 175 MCG/3ML IN SOLN
175.0000 ug | Freq: Every day | RESPIRATORY_TRACT | Status: DC
  Administered 2020-11-28 – 2020-12-03 (×5): 175 ug via RESPIRATORY_TRACT
  Filled 2020-11-27 (×18): qty 3

## 2020-11-27 NOTE — Evaluation (Signed)
Physical Therapy Evaluation Patient Details Name: Sayla Golonka MRN: [id number] DOB: December 18, 1965 Today's Date: 11/27/2020   History of Present Illness  Erika Slaby is a 54 y.o. female with medical history significant of anxiety, panic attacks, depression, osteoarthritis, chronic lower back pain, COPD/active smoker, GERD, history of hiatal hernia, history of bleeding ulcers, hypokalemia, history of pneumonia who is coming to the emergency department with complaints of fever and progressively worse dyspnea for the past 3 days.  All other associated symptoms are occasionally productive of whitish sputum cough, fatigue, malaise, wheezing and headache.  No travel history or sick contacts.  No significant relief by medications at home.  She has doubled her O2 requirement from 3 LPM to 6 LPM with some improvement.  She mentions that she is supposed to use her oxygen at night, but for the past 3 days she has been using it during daytime as well.  She denies hemoptysis, chest pain, palpitations, dizziness, diaphoresis, PND, orthopnea or recent pitting edema of the lower extremities.  She denies abdominal pain, nausea, vomiting, diarrhea, constipation, melena or hematochezia.  No dysuria, frequency or hematuria.  No polyuria, polydipsia, polyphagia or blurred vision.    Clinical Impression  Patient very anxious and limited mostly due to c/o of having panic attack due to difficulty breathing, has to lean on nearby objects for support when taking steps and limited to a ambulation at bedside with SpO2 dropping from 91% to 84% with exertion while on 6 LPM O2.  Patient tolerated sitting up in chair after therapy while being assisted by RN to redo IV insertion.  Patient will benefit from continued physical therapy in hospital and recommended venue below to increase strength, balance, endurance for safe ADLs and gait.     Follow Up Recommendations Home health PT;Supervision - Intermittent    Equipment  Recommendations  None recommended by PT    Recommendations for Other Services       Precautions / Restrictions Precautions Precautions: Fall Restrictions Weight Bearing Restrictions: No      Mobility  Bed Mobility Overal bed mobility: Modified Independent                  Transfers Overall transfer level: Modified independent                  Ambulation/Gait Ambulation/Gait assistance: Min guard;Min assist Gait Distance (Feet): 15 Feet Assistive device: 1 person hand held assist;None Gait Pattern/deviations: Decreased step length - left;Decreased stance time - right;Decreased stride length Gait velocity: decreased   General Gait Details: has to lean on bed rail and nearby objects for support mostly due to c/o fatigue and difficulty breathing while on 6 LPM O2 with SpO2 at 87-89%  Stairs            Wheelchair Mobility    Modified Rankin (Stroke Patients Only)       Balance Overall balance assessment: Needs assistance Sitting-balance support: Feet supported;No upper extremity supported Sitting balance-Leahy Scale: Good Sitting balance - Comments: seated at EOB   Standing balance support: During functional activity;No upper extremity supported Standing balance-Leahy Scale: Poor Standing balance comment: fair/poor without AD                             Pertinent Vitals/Pain Pain Assessment: No/denies pain    Home Living Family/patient expects to be discharged to:: Private residence Living Arrangements: Alone Available Help at Discharge: Family;Available PRN/intermittently Type of Home: Mobile home Home  Access: Level entry     Home Layout: One level Home Equipment: Environmental consultant - 2 wheels      Prior Function Level of Independence: Independent         Comments: Tourist information centre manager, drives     Hand Dominance   Dominant Hand: Right    Extremity/Trunk Assessment   Upper Extremity Assessment Upper Extremity Assessment:  Overall WFL for tasks assessed    Lower Extremity Assessment Lower Extremity Assessment: Generalized weakness    Cervical / Trunk Assessment Cervical / Trunk Assessment: Normal  Communication   Communication: No difficulties  Cognition Arousal/Alertness: Awake/alert Behavior During Therapy: WFL for tasks assessed/performed Overall Cognitive Status: Within Functional Limits for tasks assessed                                        General Comments      Exercises     Assessment/Plan    PT Assessment Patient needs continued PT services  PT Problem List Decreased strength;Decreased activity tolerance;Decreased balance;Decreased mobility;Cardiopulmonary status limiting activity       PT Treatment Interventions Balance training;DME instruction;Gait training;Stair training;Functional mobility training;Therapeutic activities;Therapeutic exercise;Patient/family education    PT Goals (Current goals can be found in the Care Plan section)  Acute Rehab PT Goals Patient Stated Goal: return home PT Goal Formulation: With patient Time For Goal Achievement: 12/05/20 Potential to Achieve Goals: Good    Frequency Min 3X/week   Barriers to discharge        Co-evaluation               AM-PAC PT "6 Clicks" Mobility  Outcome Measure Help needed turning from your back to your side while in a flat bed without using bedrails?: None Help needed moving from lying on your back to sitting on the side of a flat bed without using bedrails?: None Help needed moving to and from a bed to a chair (including a wheelchair)?: A Little Help needed standing up from a chair using your arms (e.g., wheelchair or bedside chair)?: None Help needed to walk in hospital room?: A Lot Help needed climbing 3-5 steps with a railing? : A Lot 6 Click Score: 19    End of Session Equipment Utilized During Treatment: Oxygen Activity Tolerance: Patient tolerated treatment well;Patient limited  by fatigue Patient left: in chair;with call bell/phone within reach;with nursing/sitter in room Nurse Communication: Mobility status PT Visit Diagnosis: Unsteadiness on feet (R26.81);Other abnormalities of gait and mobility (R26.89);Muscle weakness (generalized) (M62.81)    Time: 6967-8938 PT Time Calculation (min) (ACUTE ONLY): 28 min   Charges:   PT Evaluation $PT Eval Moderate Complexity: 1 Mod PT Treatments $Therapeutic Activity: 23-37 mins        11:30 AM, 11/27/20 Ocie Bob, MPT Physical Therapist with [facility] 336 [phone] office [phone] mobile phone

## 2020-11-27 NOTE — Progress Notes (Signed)
Dr. Arbutus Leas to bedside to discuss patient status and need to decrease amount of oxygen. Patient exhibited agitation and anxiety in regards to her breathing and oxygen needs. Dr. Sherene Sires consulted and to patient's room to discuss condition as well. Chest x-ray ordered. Patient continues to display high levels of anxiety and panic stating "I can't breathe, someone help me". She continues to tell us to intubate her and that she "can't do this anymore". Patient continuously asks for PRN Valium and Percocet despite being educated on purpose and need for those medications. Patient's son called and was updated. Son stated that the patient takes Valium and Percocet excessively at home and stated "good luck with trying to ger her to understand". The patient's mother was updated this morning as well and both were receptive of the above information. Patient currently resting in chair on 6 liters [place] with oxygen saturations ranging in the upper 80s and higher.

## 2020-11-27 NOTE — Progress Notes (Signed)
PROGRESS NOTE  Debra [name] ZOX:[id number] DOB: [birth year] DOA: 11/22/2020 PCP: [name], MD    Brief History:  54 y.o.femalewith medical history significant ofanxiety, panic attacks, depression, osteoarthritis, chronic lower back pain, COPD/active smoker, GERD, history of hiatal hernia, history of bleeding ulcers, hypokalemia, history of pneumonia who is coming to the emergency department with complaints of fever and progressively worse dyspnea for the past 3 days.All other associated symptoms are occasionally productive of whitish sputum cough, fatigue, malaise, wheezing and headache. No travel history or sick contacts. No significant relief by medications at home. She has doubledher O2 requirement from 3 LPM to 6 LPM with some improvement. She mentions that she is supposed to use her oxygen at night, but for the past 3 days she has been using it during daytime as well.  ED Course:Initial vital signs were temperature101.5 F, pulse 118, respirations 22, BP 104/86 mmHg and O2 sat 87% on 6 LPM via nasal cannula. The patient is currently on 10 LPM via HFNC. He received acetaminophen at 1000 mg p.o. x1, a 1000 mL NS bolus, 8 mg of dexamethasone IVP x1 and doxycycline 100 mg IVPB every 12 hours was started.  Labwork: Coronavirus 2 and influenza PCR was negative. CBC showed a white count of 9.4 with 85% neutrophils, hemoglobin 10.0 g/dL and platelets 811. Lactic acid was normal. CMP showed normal sodium, chloride and CO2, but potassium level was 2.9 mmol/L. Glucose 121 and calcium 7.5 mg/dL. Renal function was normal. Total protein 6.3, albumin 3.0 g/dL. Alkaline phosphatase 130 units/L. AST, ALT and total bilirubin were normal.  Assessment/Plan: Acute on chronic Respiratory Failure with Hypoxia -due to pneumonia and COPD exacerbation -chronically on 3L at home at night -12/26 CTA chest--no PE; RLL consolidation with bilateral patchy GGO -has regressed,  needing HHFNC up to 25L>>back to 15L -wean oxygen for saturation >90% -pulmonary consult appreciated--discussed with Dr. Sherene Sires -may need prn BiPAP -12/30 personally reviewed CXR--improving opacities, no edema  Lobar Pnuemonia -continue cefepime and doxy -repeat PCT--0.17  COPD exacerbation -continue duonebs>>revefenacin nebs per pulm -add pulmicort -restarted IV solumedrol -continue mucinex -IS, flutter valve  Anxiety  -continue home dose klonopin 5 mg q 6 hrs prn anxiety -PMP Aware reviewed -continue celexa  Chronic pain syndrome -continue home dose percocet 7.5/325 and pregabaline  Hypokalemia -replete -check mag--2.2  Hyperlipidemia -noncompliant with statin  Tobacco abuse -cessation discussed -nicoderm patch      Status is: Inpatient  Remains inpatient appropriate because:IV treatments appropriate due to intensity of illness or inability to take PO   Dispo: The patient is from: Home  Anticipated d/c is [facility]  Anticipated d/c date is: 3 days  Patient currently Not medically     Subjective: Patient complains of sob with minimal activity.  Not much better.  Denies f/c cp, n/v/d, abd pain  Objective: Vitals:   11/27/20 0900 11/27/20 1000 11/27/20 1100 11/27/20 1200  BP:   134/82 123/78  Pulse: 94 79 85 91  Resp: (!) 23 (!) 21 (!) 23 (!) 26  Temp:      TempSrc:      SpO2: 99%  100% 100%  Weight:      Height:        Intake/Output Summary (Last 24 hours) at 11/27/2020 1312 Last data filed at 11/27/2020 0602 Gross per 24 hour  Intake 500 ml  Output --  Net 500 ml   Weight change:  Exam:   General:  Pt is alert,  follows commands appropriately, not in acute distress  HEENT: No icterus, No thrush, No neck mass, [place]/AT  Cardiovascular: RRR, S1/S2, no rubs, no gallops  Respiratory: bilateral rales.  Bilateral rhonchi  Abdomen: Soft/+BS, non tender, non distended, no  guarding  Extremities: No edema, No lymphangitis, No petechiae, No rashes, no synovitis   Data Reviewed: I have personally reviewed following labs and imaging studies Basic Metabolic Panel: Recent Labs  Lab 11/22/20 2215 11/23/20 0536 11/24/20 0701 11/25/20 0418 11/26/20 0235 11/27/20 0525  NA 135  --  139 141 140 141  K 2.8*  --  3.9 3.8 3.5 4.3  CL 98  --  103 103 102 101  CO2 27  --  26 28 29 28   GLUCOSE 121*  --  133* 106* 87 123*  BUN 6  --  7 9 9 12   CREATININE 0.69  --  0.46 0.42* 0.44 0.39*  CALCIUM 7.5*  --  7.7* 7.8* 7.9* 8.1*  MG  --  2.8* 2.2 2.4 2.3 2.2  PHOS  --  2.4*  --   --   --   --    Liver Function Tests: Recent Labs  Lab 11/22/20 2215 11/24/20 0701 11/25/20 0418 11/26/20 0235 11/27/20 0525  AST 31 48* 35 34 24  ALT 28 34 31 28 27   ALKPHOS 130* 124 107 106 91  BILITOT 0.5 0.6 0.4 0.6 0.7  PROT 6.3* 5.9* 5.7* 5.8* 5.8*  ALBUMIN 3.0* 2.8* 2.6* 2.6* 2.6*   No results for input(s): LIPASE, AMYLASE in the last 168 hours. No results for input(s): AMMONIA in the last 168 hours. Coagulation Profile: No results for input(s): INR, PROTIME in the last 168 hours. CBC: Recent Labs  Lab 11/22/20 2215 11/24/20 0701 11/25/20 0418 11/26/20 0235 11/27/20 0525  WBC 9.4 8.1 7.3 5.0 5.3  NEUTROABS 8.0* 6.9 5.5 2.6 3.6  HGB 10.0* 9.0* 9.4* 9.4* 9.1*  HCT 33.2* 29.8* 32.3* 31.6* 30.1*  MCV 90.7 91.4 94.7 93.5 91.2  PLT 243 235 228 239 331   Cardiac Enzymes: No results for input(s): CKTOTAL, CKMB, CKMBINDEX, TROPONINI in the last 168 hours. BNP: Invalid input(s): POCBNP CBG: No results for input(s): GLUCAP in the last 168 hours. HbA1C: No results for input(s): HGBA1C in the last 72 hours. Urine analysis:    Component Value Date/Time   COLORURINE YELLOW 07/12/2017 1455   APPEARANCEUR CLEAR 07/12/2017 1455   LABSPEC 1.005 07/12/2017 1455   PHURINE 8.0 07/12/2017 1455   GLUCOSEU NEGATIVE 07/12/2017 1455   HGBUR NEGATIVE 07/12/2017 1455    BILIRUBINUR NEGATIVE 07/12/2017 1455   KETONESUR 5 (A) 07/12/2017 1455   PROTEINUR NEGATIVE 07/12/2017 1455   NITRITE NEGATIVE 07/12/2017 1455   LEUKOCYTESUR NEGATIVE 07/12/2017 1455   Sepsis Labs: @LABRCNTIP (procalcitonin:4,lacticidven:4) ) Recent Results (from the past 240 hour(s))  Resp Panel by RT-PCR (Flu A&B, Covid) Nasopharyngeal Swab     Status: None   Collection Time: 11/22/20 10:15 PM   Specimen: Nasopharyngeal Swab; Nasopharyngeal(NP) swabs in vial transport medium  Result Value Ref Range Status   SARS Coronavirus 2 by RT PCR NEGATIVE NEGATIVE Final    Comment: (NOTE) SARS-CoV-2 target nucleic acids are NOT DETECTED.  The SARS-CoV-2 RNA is generally detectable in upper respiratory specimens during the acute phase of infection. The lowest concentration of SARS-CoV-2 viral copies this assay can detect is 138 copies/mL. A negative result does not preclude SARS-Cov-2 infection and should not be used as the sole basis for treatment or other patient management decisions. A negative  result may occur with  improper specimen collection/handling, submission of specimen other than nasopharyngeal swab, presence of viral mutation(s) within the areas targeted by this assay, and inadequate number of viral copies(<138 copies/mL). A negative result must be combined with clinical observations, patient history, and epidemiological information. The expected result is Negative.  Fact Sheet for Patients:  BloggerCourse.comhttps://www.fda.gov/media/152166/download  Fact Sheet for Healthcare Providers:  SeriousBroker.ithttps://www.fda.gov/media/152162/download  This test is no t yet approved or cleared by the [place] FDA and  has been authorized for detection and/or diagnosis of SARS-CoV-2 by FDA under an Emergency Use Authorization (EUA). This EUA will remain  in effect (meaning this test can be used) for the duration of the COVID-19 declaration under Section 564(b)(1) of the Act, 21 U.S.C.section 360bbb-3(b)(1),  unless the authorization is terminated  or revoked sooner.       Influenza A by PCR NEGATIVE NEGATIVE Final   Influenza B by PCR NEGATIVE NEGATIVE Final    Comment: (NOTE) The Xpert Xpress SARS-CoV-2/FLU/RSV plus assay is intended as an aid in the diagnosis of influenza from Nasopharyngeal swab specimens and should not be used as a sole basis for treatment. Nasal washings and aspirates are unacceptable for Xpert Xpress SARS-CoV-2/FLU/RSV testing.  Fact Sheet for Patients: BloggerCourse.comhttps://www.fda.gov/media/152166/download  Fact Sheet for Healthcare Providers: SeriousBroker.ithttps://www.fda.gov/media/152162/download  This test is not yet approved or cleared by the [place] FDA and has been authorized for detection and/or diagnosis of SARS-CoV-2 by FDA under an Emergency Use Authorization (EUA). This EUA will remain in effect (meaning this test can be used) for the duration of the COVID-19 declaration under Section 564(b)(1) of the Act, 21 U.S.C. section 360bbb-3(b)(1), unless the authorization is terminated or revoked.  Performed at [facility], [address] [place], [place] [address]   Blood Culture (routine x 2)     Status: None (Preliminary result)   Collection Time: 11/22/20 11:06 PM   Specimen: BLOOD RIGHT ARM  Result Value Ref Range Status   Specimen Description BLOOD RIGHT ARM  Final   Special Requests   Final    BOTTLES DRAWN AEROBIC AND ANAEROBIC Blood Culture adequate volume   Culture   Final    NO GROWTH 3 DAYS Performed at [facility], [address] [place], [place] [address]    Report Status PENDING  Incomplete  Blood Culture (routine x 2)     Status: None (Preliminary result)   Collection Time: 11/22/20 11:20 PM   Specimen: Left Antecubital; Blood  Result Value Ref Range Status   Specimen Description LEFT ANTECUBITAL  Final   Special Requests   Final    BOTTLES DRAWN AEROBIC AND ANAEROBIC Blood Culture adequate volume   Culture   Final    NO GROWTH 3 DAYS Performed  at [facility], [address] [place], [place] [address]    Report Status PENDING  Incomplete  Resp Panel by RT-PCR (Flu A&B, Covid) Nasopharyngeal Swab     Status: None   Collection Time: 11/23/20  8:54 AM   Specimen: Nasopharyngeal Swab; Nasopharyngeal(NP) swabs in vial transport medium  Result Value Ref Range Status   SARS Coronavirus 2 by RT PCR NEGATIVE NEGATIVE Final    Comment: (NOTE) SARS-CoV-2 target nucleic acids are NOT DETECTED.  The SARS-CoV-2 RNA is generally detectable in upper respiratory specimens during the acute phase of infection. The lowest concentration of SARS-CoV-2 viral copies this assay can detect is 138 copies/mL. A negative result does not preclude SARS-Cov-2 infection and should not be used as the sole  basis for treatment or other patient management decisions. A negative result may occur with  improper specimen collection/handling, submission of specimen other than nasopharyngeal swab, presence of viral mutation(s) within the areas targeted by this assay, and inadequate number of viral copies(<138 copies/mL). A negative result must be combined with clinical observations, patient history, and epidemiological information. The expected result is Negative.  Fact Sheet for Patients:  BloggerCourse.com  Fact Sheet for Healthcare Providers:  SeriousBroker.it  This test is no t yet approved or cleared by the [place] FDA and  has been authorized for detection and/or diagnosis of SARS-CoV-2 by FDA under an Emergency Use Authorization (EUA). This EUA will remain  in effect (meaning this test can be used) for the duration of the COVID-19 declaration under Section 564(b)(1) of the Act, 21 U.S.C.section 360bbb-3(b)(1), unless the authorization is terminated  or revoked sooner.       Influenza A by PCR NEGATIVE NEGATIVE Final   Influenza B by PCR NEGATIVE NEGATIVE Final    Comment: (NOTE) The Xpert  Xpress SARS-CoV-2/FLU/RSV plus assay is intended as an aid in the diagnosis of influenza from Nasopharyngeal swab specimens and should not be used as a sole basis for treatment. Nasal washings and aspirates are unacceptable for Xpert Xpress SARS-CoV-2/FLU/RSV testing.  Fact Sheet for Patients: BloggerCourse.com  Fact Sheet for Healthcare Providers: SeriousBroker.it  This test is not yet approved or cleared by the [place] FDA and has been authorized for detection and/or diagnosis of SARS-CoV-2 by FDA under an Emergency Use Authorization (EUA). This EUA will remain in effect (meaning this test can be used) for the duration of the COVID-19 declaration under Section 564(b)(1) of the Act, 21 U.S.C. section 360bbb-3(b)(1), unless the authorization is terminated or revoked.  Performed at [facility], [address], [place], [place] [address]   MRSA PCR Screening     Status: None   Collection Time: 11/23/20  9:22 AM   Specimen: Nasal Mucosa; Nasopharyngeal  Result Value Ref Range Status   MRSA by PCR NEGATIVE NEGATIVE Final    Comment:        The GeneXpert MRSA Assay (FDA approved for NASAL specimens only), is one component of a comprehensive MRSA colonization surveillance program. It is not intended to diagnose MRSA infection nor to guide or monitor treatment for MRSA infections. Performed at [facility], [address] [place], [place] [address]      Scheduled Meds: . budesonide (PULMICORT) nebulizer solution  0.5 mg Nebulization BID  . Chlorhexidine Gluconate Cloth  6 each Topical Daily  . citalopram  40 mg Oral Daily  . doxycycline  100 mg Oral Q12H  . enoxaparin (LOVENOX) injection  40 mg Subcutaneous Q24H  . feeding supplement  237 mL Oral BID BM  . guaiFENesin  1,200 mg Oral BID  . ipratropium-albuterol  3 mL Nebulization Q6H  . methylPREDNISolone (SOLU-MEDROL) injection  40 mg Intravenous Q12H  . multivitamin  with minerals  1 tablet Oral Daily  . nicotine  21 mg Transdermal Daily  . pantoprazole  40 mg Oral Daily  . potassium chloride  10 mEq Oral BID  . pregabalin  200 mg Oral QHS   Continuous Infusions: . ceFEPime (MAXIPIME) IV 2 g (11/27/20 1006)    Procedures/Studies: CT ANGIO CHEST PE W OR WO CONTRAST  Result Date: 11/23/2020 CLINICAL DATA:  Shortness of breath and left-sided chest pain with decreased oxygen saturation EXAM: CT ANGIOGRAPHY CHEST WITH CONTRAST TECHNIQUE: Multidetector CT imaging of the chest was performed  using the standard protocol during bolus administration of intravenous contrast. Multiplanar CT image reconstructions and MIPs were obtained to evaluate the vascular anatomy. CONTRAST:  61mL OMNIPAQUE IOHEXOL 350 MG/ML SOLN COMPARISON:  Chest x-ray from the previous day. FINDINGS: Cardiovascular: Thoracic aorta shows minimal atherosclerotic calcifications. No aneurysmal dilatation or dissection is noted. No cardiac enlargement is seen. The coronary arteries demonstrate mild calcification. Pulmonary artery is well visualized within normal branching pattern. No findings to suggest pulmonary emboli are noted. Mediastinum/Nodes: Thoracic inlet is within normal limits. Scattered hilar and subcarinal adenopathy is noted likely reactive in nature. The esophagus is within normal limits. Lungs/Pleura: Consolidation is noted in the right lower lobe as well as scattered predominately peripheral airspace opacities within both lungs. No sizable effusion is noted. No sizable parenchymal nodules are noted. Upper Abdomen: No acute abnormality noted. Musculoskeletal: No acute bony abnormality is noted Review of the MIP images confirms the above findings. IMPRESSION: Right lower lobe consolidation with patchy ground-glass airspace opacities bilaterally consistent with multifocal pneumonia. Correlate with COVID-19 testing. No evidence of pulmonary emboli. Aortic Atherosclerosis (ICD10-I70.0).  Electronically Signed   By: Alcide Clever M.D.   On: 11/23/2020 09:09   DG CHEST PORT 1 VIEW  Result Date: 11/25/2020 CLINICAL DATA:  Shortness of breath.  History of COPD. EXAM: PORTABLE CHEST 1 VIEW COMPARISON:  Single-view of the chest 11/22/2020. PA and lateral chest 08/30/2018. CT chest 11/23/2020. FINDINGS: The lungs are emphysematous. Patchy bilateral airspace disease most compatible with pneumonia has progressed with new opacities seen in the upper lung zones bilaterally. No pneumothorax or pleural fluid. Heart size is normal. Aortic atherosclerosis. IMPRESSION: Patchy bilateral airspace disease compatible with plain film pneumonia has worsened since the most recent plain film comparison. Aortic Atherosclerosis (ICD10-I70.0) and Emphysema (ICD10-J43.9). Electronically Signed   By: Drusilla Kanner M.D.   On: 11/25/2020 10:23   DG Chest Portable 1 View  Result Date: 11/22/2020 CLINICAL DATA:  Shortness of breath, COPD. EXAM: PORTABLE CHEST 1 VIEW COMPARISON:  Chest x-ray 11/12/2019, CT chest 02/07/2020 FINDINGS: The heart size and mediastinal contours are within normal limits. Hyperinflation of the lungs consistent with emphysematous changes. Interval increase in interstitial markings diffusely but more prominent within the lower lobes. Hazy patchy bilateral lower lobe hazy airspace opacities. No pleural effusion. No pneumothorax. No acute osseous abnormality. IMPRESSION: Increased interstitial markings and hazy airspace opacities more prominent within the lower lobes. Findings likely represent infection/inflammation. COVID-19 infection not excluded. Followup PA and lateral chest X-ray is recommended in 3-4 weeks following therapy to ensure resolution and exclude underlying malignancy. Electronically Signed   By: Tish Frederickson M.D.   On: 11/22/2020 21:40    Catarina Hartshorn, DO  [facility]  If 7PM-7AM, please contact night-coverage [email or web address] Password [email or web address] 11/27/2020, 1:12 PM   LOS:  4 days

## 2020-11-27 NOTE — Progress Notes (Signed)
Patient was very anxious and would not follow any attempts to deescalate her anxiety or use any other Oxygen supplying devices. Notified physician and upon collaboration acquired order for HHF O2  Patient had also been administered normal meds and then resp meds by RT Patient is now able to sleep on current O2 and has no other complaints All other concerns addressed

## 2020-11-27 NOTE — Plan of Care (Signed)
°  Problem: Acute Rehab PT Goals(only PT should resolve) Goal: Pt Will Go Supine/Side To Sit Outcome: Progressing Flowsheets (Taken 11/27/2020 1133) Pt will go Supine/Side to Sit: Independently Goal: Patient Will Transfer Sit To/From Stand Outcome: Progressing Flowsheets (Taken 11/27/2020 1133) Patient will transfer sit to/from stand: with modified independence Goal: Pt Will Transfer Bed To Chair/Chair To Bed Outcome: Progressing Flowsheets (Taken 11/27/2020 1133) Pt will Transfer Bed to Chair/Chair to Bed: with modified independence Goal: Pt Will Ambulate Outcome: Progressing Flowsheets (Taken 11/27/2020 1133) Pt will Ambulate:  50 feet  with supervision  with modified independence  with rolling walker   11:33 AM, 11/27/20 Ocie Bob, MPT Physical Therapist with [facility] [phone] office [phone] mobile phone

## 2020-11-27 NOTE — Progress Notes (Signed)
Patient transferred to chair with Fayrene Fearing, PT. To and from bedside commode with no difficulty. Patient's room was cleaned and all linens changed. Bath given by [place], NT. Patient resting in chair with minimal assistance requests. Still experiencing high anxiety and panic attacks where patient states "I can't breathe". Oxygen saturations are consistent in the upper 90's. Currently on 25 Liters, 60% HFNC. Working with respiratory to wean off oxygen needs.

## 2020-11-27 NOTE — Consult Note (Addendum)
NAME:  Debra [name], MRN:  [id number], DOB:  March 01, 1966, LOS: 4 ADMISSION DATE:  11/22/2020, CONSULTATION DATE:  12/30 REFERRING MD:  Tat[facility], CHIEF COMPLAINT:  resp distress    Brief History:  49 yowf active smoker GOLD III criteria followed by [name] with lung nodule on dulera 200/ spriiva with acut worsening sob/ cough/ congestion x one week PTA and PCCM service asked to see by Dr Tat wit no response to aecop since admit 12/25   History of Present Illness:  54 y.o. female with medical history significant of anxiety, panic attacks, depression, osteoarthritis, chronic lower back pain, COPD/active smoker, GERD, history of hiatal hernia, history of bleeding ulcers, hypokalemia, history of pneumonia who came to emergency department with complaints of fever and progressively worse dyspneax 3 days PTA.   All other associated symptoms are occasionally productive of whitish sputum cough, fatigue, malaise, wheezing and headache.  No travel history or sick contacts.  No significant relief by medications at home.  She has doubled her O2 requirement from 3 LPM to 6 LPM with some improvement   ED Course: Initial vital signs were temperature 101.5 F, pulse 118, respirations 22, BP 104/86 mmHg and O2 sat 87% on 6 LPM via nasal cannula.     She received acetaminophen at 1000 mg p.o. x1, a 1000 mL NS bolus, 8 mg of dexamethasone IVP x1 and doxycycline 100 mg IVPB every 12 hours was started.  Labwork: Coronavirus 2 and influenza PCR was negative.  CBC showed a white count of 9.4 with 85% neutrophils, hemoglobin 10.0 g/dL and platelets 387.  Lactic acid was normal.  CMP showed normal sodium, chloride and CO2, but potassium level was 2.9 mmol/L.  Glucose 121 and calcium 7.5 mg/dL.  Renal function was normal.  Total protein 6.3, albumin 3.0 g/dL.  Alkaline phosphatase 130 units/L.  AST, ALT and total bilirubin were normal.  Past Medical History:    Anxiety  Arthritis Chronic lower back pain COPD (chronic  obstructive pulmonary disease) (HCC)  PFT's   08/30/18 FEV1 1.45 (49 % ) ratio 0.46  p 0 % improvement from saba p ? prior to study with DLCO  11.47 (42%) corrects to 2.13 (42%)  for alv volume and FV curve classic concavity   Depression GERD (gastroesophageal reflux disease)  History of bleeding ulcers (1994)   Significant Hospital Events:    Consults:  PCCM 12/30  Procedures:    Significant Diagnostic Tests:  CTa 11/23/20 Right lower lobe consolidation with patchy ground-glass airspace opacities bilaterally consistent with multifocal pneumonia. Correlate with COVID-19 testing. No evidence of pulmonary emboli.  Micro Data:  Resp viral panel PCR 12/25  > neg flu/ neg covid 19 BC x 2  12/25 > neg  MRSA PCR  12/26 > neg  Resp viral panel PCR 12/26  > neg flu/ neg covid 19 Urine strep 12/26 neg Urine legionella 12/ 26 neg   Antimicrobials:  Doxy  12/25 >>> Cefepime 12/26 >>>  Scheduled Meds:  budesonide (PULMICORT) nebulizer solution  0.5 mg Nebulization BID   Chlorhexidine Gluconate Cloth  6 each Topical Daily   citalopram  40 mg Oral Daily   doxycycline  100 mg Oral Q12H   enoxaparin (LOVENOX) injection  40 mg Subcutaneous Q24H   feeding supplement  237 mL Oral BID BM   guaiFENesin  1,200 mg Oral BID   ipratropium-albuterol  3 mL Nebulization Q6H   methylPREDNISolone (SOLU-MEDROL) injection  40 mg Intravenous Q12H   multivitamin with minerals  1 tablet  Oral Daily   nicotine  21 mg Transdermal Daily   pantoprazole  40 mg Oral Daily   potassium chloride  10 mEq Oral BID   pregabalin  200 mg Oral QHS   Continuous Infusions:  ceFEPime (MAXIPIME) IV 2 g (11/27/20 1006)   PRN Meds:.acetaminophen **OR** acetaminophen, albuterol, diazepam, naLOXone (NARCAN)  injection, nystatin cream, ondansetron **OR** ondansetron (ZOFRAN) IV, oxyCODONE-acetaminophen, sodium chloride, zolpidem     Interim History / Subjective:  Sob at rest, very congested sounding cough  / hopeless helpless affect/attitude   Objective   Blood pressure 123/78, pulse 92, temperature 98.1 F (36.7 C), temperature source Oral, resp. rate (!) 28, height 5\' 7"  (1.702 m), weight 75 kg, SpO2 95 %.    FiO2 (%):  [60 %-100 %] 60 %   Intake/Output Summary (Last 24 hours) at 11/27/2020 1349 Last data filed at 11/27/2020 0602 Gross per 24 hour  Intake 500 ml  Output --  Net 500 ml   Filed Weights   11/22/20 2137 11/24/20 1820 11/25/20 0500  Weight: 72.1 kg 75 kg 75 kg    Examination: Tmax 98.1  Pt alert but tired appearing, sitting in bed bending over female table No jvd Oropharynx clear,  mucosa nl Neck supple Lungs withvery distant  exp > insp rhonchi bilaterally RRR no s3 or or sign murmur Abd obese with limited excursion  Extr warm with no edema or clubbing noted Neuro  Sensorium alert,  no apparent motor deficits      I personally reviewed images and agree with radiology impression as follows:  CXR:  12/30   Improving aeration at the lung bases bilaterally.  Resolved Hospital Problem list      Assessment & Plan:  1) CAP in pt with severe copd with acute on chronic resp failure  - PCT trending down on empirical rx as per abx flow sheet  >> no change rx  2) GOLD III COPD with very poor reserve/ still smoking PTA - high risk intubation and unlikely to come off with request she not be maint on vent long term  >>> avoid ET/ add bipap prn  >>> she has been on formorterol at home so should be able to tolerate it here > add brovana / yupelri to budesonide.   3) Acute hypoxemic resp failure >> goal is sats > 90% / bipap prn - could use combination of low dose precedex and bipap in this setting   Best practice (evaluated daily)  Diet: per [facility] Pain/Anxiety/Delirium protocol (if indicated): per [facility] VAP protocol (if indicated): n/a DVT prophylaxis: per [facility]  GI prophylaxis: per [facility]  Glucose control: per [facility]  Mobility: out of bed as much as  possible Disposition:ICU - desire not prolonged vent  "what's the poin of that if I'm not gonna get better"      Labs   CBC: Recent Labs  Lab 11/22/20 2215 11/24/20 0701 11/25/20 0418 11/26/20 0235 11/27/20 0525  WBC 9.4 8.1 7.3 5.0 5.3  NEUTROABS 8.0* 6.9 5.5 2.6 3.6  HGB 10.0* 9.0* 9.4* 9.4* 9.1*  HCT 33.2* 29.8* 32.3* 31.6* 30.1*  MCV 90.7 91.4 94.7 93.5 91.2  PLT 243 235 228 239 331    Basic Metabolic Panel: Recent Labs  Lab 11/22/20 2215 11/23/20 0536 11/24/20 0701 11/25/20 0418 11/26/20 0235 11/27/20 0525  NA 135  --  139 141 140 141  K 2.8*  --  3.9 3.8 3.5 4.3  CL 98  --  103 103 102 101  CO2  27  --  26 28 29 28   GLUCOSE 121*  --  133* 106* 87 123*  BUN 6  --  7 9 9 12   CREATININE 0.69  --  0.46 0.42* 0.44 0.39*  CALCIUM 7.5*  --  7.7* 7.8* 7.9* 8.1*  MG  --  2.8* 2.2 2.4 2.3 2.2  PHOS  --  2.4*  --   --   --   --    GFR: Estimated Creatinine Clearance: 85 mL/min (A) (by C-G formula based on SCr of 0.39 mg/dL (L)). Recent Labs  Lab 11/22/20 2215 11/22/20 2307 11/24/20 0701 11/25/20 0418 11/26/20 0235 11/27/20 0525  PROCALCITON 4.45  --   --   --   --  0.17  WBC 9.4  --  8.1 7.3 5.0 5.3  LATICACIDVEN 1.5 0.9  --   --   --   --     Liver Function Tests: Recent Labs  Lab 11/22/20 2215 11/24/20 0701 11/25/20 0418 11/26/20 0235 11/27/20 0525  AST 31 48* 35 34 24  ALT 28 34 31 28 27   ALKPHOS 130* 124 107 106 91  BILITOT 0.5 0.6 0.4 0.6 0.7  PROT 6.3* 5.9* 5.7* 5.8* 5.8*  ALBUMIN 3.0* 2.8* 2.6* 2.6* 2.6*   No results for input(s): LIPASE, AMYLASE in the last 168 hours. No results for input(s): AMMONIA in the last 168 hours.  ABG    Component Value Date/Time   TCO2 30 07/25/2020 1525     Coagulation Profile: No results for input(s): INR, PROTIME in the last 168 hours.  Cardiac Enzymes: No results for input(s): CKTOTAL, CKMB, CKMBINDEX, TROPONINI in the last 168 hours.  HbA1C: No results found for: HGBA1C  CBG: No results for  input(s): GLUCAP in the last 168 hours.     Past Medical History:  She,  has a past medical history of Anxiety, Arthritis, Chronic lower back pain, Complication of anesthesia, COPD (chronic obstructive pulmonary disease) (HCC), Depression, Dyspnea, GERD (gastroesophageal reflux disease), History of bleeding ulcers (1994), History of hiatal hernia, History of low potassium, and Pneumonia (2013).   Surgical History:   Past Surgical History:  Procedure Laterality Date   ANTERIOR CERVICAL DECOMP/DISCECTOMY FUSION N/A 05/18/2017   Procedure: ACDF - C6-C7;  Surgeon: Coletta Memosabbell, Kyle, MD;  Location: [facility] OR;  Service: Neurosurgery;  Laterality: N/A;   ANTERIOR LUMBAR FUSION  2013   "L-6"   ESOPHAGOGASTRODUODENOSCOPY N/A 07/14/2017   Procedure: ESOPHAGOGASTRODUODENOSCOPY (EGD);  Surgeon: Meryl DareStark, Malcolm T, MD;  [facility];  Service: Endoscopy;  Laterality: N/A;   POSTERIOR LUMBAR FUSION  06/07/2018   L5-S1   TONSILLECTOMY     UPPER GASTROINTESTINAL ENDOSCOPY  1994   VAGINAL HYSTERECTOMY  1997     Social History:   reports that she has been smoking cigarettes. She has a 17.50 pack-year smoking history. She has never used smokeless tobacco. She reports current alcohol use. She reports previous drug use.   Family History:  Her family history includes COPD in her mother; Heart attack in her paternal grandfather and paternal uncle; Heart disease in her father; Hypertension in her mother; Ulcers in her father.   Allergies Allergies  Allergen Reactions   Cefaclor Diarrhea and Rash    hives   Sertraline     Suicidal thoughts, insomnia, nightmares   Statins Dermatitis    Multiple ecchymosis areas on all extremities.   Amoxicillin-Pot Clavulanate Diarrhea    Has patient had a PCN reaction causing immediate rash, facial/tongue/throat swelling, SOB or lightheadedness  with hypotension: No Has patient had a PCN reaction causing severe rash involving mucus membranes or skin necrosis:  No Has patient had a PCN reaction that required hospitalization: No Has patient had a PCN reaction occurring within the last 10 years: Yes If all of the above answers are "NO", then may proceed with Cephalosporin use.    Codeine Nausea And Vomiting    If pt takes a lot of this med   Latex Itching and Swelling    redness   Levaquin [Levofloxacin] Swelling    Can take up to 500 mg a day   Nsaids     Bleeding ulcers   Ciprofloxacin Nausea Only and Rash    Room was spinning     Home Medications  Prior to Admission medications   Medication Sig Start Date End Date Taking? Authorizing Provider  citalopram (CELEXA) 40 MG tablet Take 40 mg by mouth daily.    Yes [provider]  diazepam (VALIUM) 5 MG tablet Take 1 tablet (5 mg total) by mouth every 6 (six) hours as needed for muscle spasms. 07/27/20  Yes Bergman, Lindie Spruce D, NP  DULERA 200-5 MCG/ACT AERO Inhale 2 puffs into the lungs 2 (two) times daily. 11/06/20  Yes [provider]  omeprazole (PRILOSEC) 40 MG capsule Take 40 mg by mouth daily.    Yes [provider]  oxyCODONE-acetaminophen (PERCOCET) 7.5-325 MG tablet Take 1 tablet by mouth 3 (three) times daily as needed. 11/16/20  Yes [provider]  polyethylene glycol (MIRALAX / GLYCOLAX) 17 g packet Take 17 g by mouth daily as needed for mild constipation.   Yes [provider]  potassium chloride (KLOR-CON) 10 MEQ tablet Take 10 mEq by mouth in the morning and at bedtime. 06/09/20  Yes [provider]  pregabalin (LYRICA) 100 MG capsule Take 200 mg by mouth at bedtime.    Yes [provider]  PROAIR HFA 108 [phone] Base) MCG/ACT inhaler Inhale 2 puffs into the lungs every 4 (four) hours as needed for wheezing or shortness of breath.  06/19/20  Yes [provider]  Tiotropium Bromide Monohydrate (SPIRIVA RESPIMAT) 2.5 MCG/ACT AERS Inhale 2 puffs into the lungs daily. 03/19/20  Yes Icard, Rachel Bo, DO       The  patient is critically ill with multiple organ systems failure and requires high complexity decision making for assessment and support, frequent evaluation and titration of therapies, application of advanced monitoring technologies and extensive interpretation of multiple databases. Critical Care Time devoted to patient care services described in this note is 40 minutes.    Sandrea Hughs, MD Pulmonary and Critical Care Medicine [facility] Cell [phone]   After 7:00 pm call Elink  [phone]

## 2020-11-28 DIAGNOSIS — E441 Mild protein-calorie malnutrition: Secondary | ICD-10-CM | POA: Diagnosis not present

## 2020-11-28 DIAGNOSIS — J189 Pneumonia, unspecified organism: Secondary | ICD-10-CM | POA: Diagnosis not present

## 2020-11-28 DIAGNOSIS — E876 Hypokalemia: Secondary | ICD-10-CM | POA: Diagnosis not present

## 2020-11-28 DIAGNOSIS — J9621 Acute and chronic respiratory failure with hypoxia: Secondary | ICD-10-CM

## 2020-11-28 DIAGNOSIS — J181 Lobar pneumonia, unspecified organism: Secondary | ICD-10-CM | POA: Diagnosis not present

## 2020-11-28 DIAGNOSIS — J441 Chronic obstructive pulmonary disease with (acute) exacerbation: Secondary | ICD-10-CM | POA: Diagnosis not present

## 2020-11-28 LAB — COMPREHENSIVE METABOLIC PANEL
ALT: 26 U/L (ref 0–44)
AST: 17 U/L (ref 15–41)
Albumin: 3 g/dL — ABNORMAL LOW (ref 3.5–5.0)
Alkaline Phosphatase: 97 U/L (ref 38–126)
Anion gap: 10 (ref 5–15)
BUN: 16 mg/dL (ref 6–20)
CO2: 29 mmol/L (ref 22–32)
Calcium: 8.5 mg/dL — ABNORMAL LOW (ref 8.9–10.3)
Chloride: 101 mmol/L (ref 98–111)
Creatinine, Ser: 0.39 mg/dL — ABNORMAL LOW (ref 0.44–1.00)
GFR, Estimated: >60 mL/min (ref >60)
Glucose, Bld: 92 mg/dL (ref 70–99)
Potassium: 3.4 mmol/L — ABNORMAL LOW (ref 3.5–5.1)
Sodium: 140 mmol/L (ref 135–145)
Total Bilirubin: 0.6 mg/dL (ref 0.3–1.2)
Total Protein: 6.3 g/dL — ABNORMAL LOW (ref 6.5–8.1)

## 2020-11-28 LAB — CBC WITH DIFFERENTIAL/PLATELET
Abs Immature Granulocytes: 0.44 10*3/uL — ABNORMAL HIGH (ref 0.00–0.07)
Basophils Absolute: 0 10*3/uL (ref 0.0–0.1)
Basophils Relative: 0 %
Eosinophils Absolute: 0 10*3/uL (ref 0.0–0.5)
Eosinophils Relative: 0 %
HCT: 34.3 % — ABNORMAL LOW (ref 36.0–46.0)
Hemoglobin: 10.3 g/dL — ABNORMAL LOW (ref 12.0–15.0)
Immature Granulocytes: 5 %
Lymphocytes Relative: 31 %
Lymphs Abs: 2.7 10*3/uL (ref 0.7–4.0)
MCH: 27.2 pg (ref 26.0–34.0)
MCHC: 30 g/dL (ref 30.0–36.0)
MCV: 90.5 fL (ref 80.0–100.0)
Monocytes Absolute: 1.2 10*3/uL — ABNORMAL HIGH (ref 0.1–1.0)
Monocytes Relative: 14 %
Neutro Abs: 4.3 10*3/uL (ref 1.7–7.7)
Neutrophils Relative %: 50 %
Platelets: 473 10*3/uL — ABNORMAL HIGH (ref 150–400)
RBC: 3.79 MIL/uL — ABNORMAL LOW (ref 3.87–5.11)
RDW: 17.5 % — ABNORMAL HIGH (ref 11.5–15.5)
WBC: 8.6 10*3/uL (ref 4.0–10.5)
nRBC: 0 % (ref 0.0–0.2)

## 2020-11-28 LAB — MAGNESIUM: Magnesium: 2.2 mg/dL (ref 1.7–2.4)

## 2020-11-28 MED ORDER — OXYMETAZOLINE HCL 0.05 % NA SOLN
1.0000 | Freq: Two times a day (BID) | NASAL | Status: DC
  Administered 2020-11-28 – 2020-12-04 (×12): 1 via NASAL
  Filled 2020-11-28: qty 30
  Filled 2020-11-28: qty 15
  Filled 2020-11-28: qty 30

## 2020-11-28 MED ORDER — AYR SALINE NASAL NA GEL
1.0000 "application " | NASAL | Status: DC | PRN
  Filled 2020-11-28: qty 14.1

## 2020-11-28 MED ORDER — ARFORMOTEROL TARTRATE 15 MCG/2ML IN NEBU
15.0000 ug | INHALATION_SOLUTION | Freq: Two times a day (BID) | RESPIRATORY_TRACT | Status: DC
  Administered 2020-11-28 – 2020-12-04 (×14): 15 ug via RESPIRATORY_TRACT
  Filled 2020-11-28 (×15): qty 2

## 2020-11-28 NOTE — Progress Notes (Signed)
Pt called out stating that "she can't breathe, and to help me." Went in patient's room and O2 sat's were 77%. Pt had pulled off her breathing tx therefore not receiving any oxygen at all. Instructed pt that it is very important for her to complete these breathing treatments, and to not pull off any kind of mask until staff arrives as that's when her oxygen will drop and she will continue to feel anxious.   Educated her on the use of bi-pap as she has that order she can use PRN , but that she is not going to get intubated as she is asking as the doctor's do not see that is the best option for her. Will continue to monitor and educate as needed.

## 2020-11-28 NOTE — Progress Notes (Signed)
PROGRESS NOTE  Debra [name]:[id number]:[id number] DOB: 11/28/1966 DOA: 11/22/2020 PCP: Smith RobertKikel, Stephen, MD  Brief History: 54 y.o.femalewith medical history significant ofanxiety, panic attacks, depression, osteoarthritis, chronic lower back pain, COPD/active smoker, GERD, history of hiatal hernia, history of bleeding ulcers, hypokalemia, history of pneumonia who is coming to the emergency department with complaints of fever and progressively worse dyspnea for the past 3 days.All other associated symptoms are occasionally productive of whitish sputum cough, fatigue, malaise, wheezing and headache. No travel history or sick contacts. No significant relief by medications at home. She has doubledher O2 requirement from 3 LPM to 6 LPM with some improvement. She mentions that she is supposed to use her oxygen at night, but for the past 3 days she has been using it during daytime as well.  ED Course:Initial vital signs were temperature101.5 F, pulse 118, respirations 22, BP 104/86 mmHg and O2 sat 87% on 6 LPM via nasal cannula. The patient is currently on 10 LPM via HFNC. He received acetaminophen at 1000 mg p.o. x1, a 1000 mL NS bolus, 8 mg of dexamethasone IVP x1 and doxycycline 100 mg IVPB every 12 hours was started.  Labwork: Coronavirus 2 and influenza PCR was negative. CBC showed a white count of 9.4 with 85% neutrophils, hemoglobin 10.0 g/dL and platelets [id number]. Lactic acid was normal. CMP showed normal sodium, chloride and CO2, but potassium level was 2.9 mmol/L. Glucose 121 and calcium 7.5 mg/dL. Renal function was normal. Total protein 6.3, albumin 3.0 g/dL. Alkaline phosphatase 130 units/L. AST, ALT and total bilirubin were normal.  Assessment/Plan: Acute on chronic Respiratory Failure with Hypoxia -due to pneumonia and COPD exacerbation -chronically on 3L at home at night -12/26 CTA chest--no PE; RLL consolidation with bilateral patchy GGO -patient continues to  have intermittent episodes of desaturation into 70-80s. -wean oxygen for saturation >90% -pulmonary consult appreciated--discussed with Dr. Sherene SiresWert -may need prn BiPAP -12/30 personally reviewed CXR--improving opacities, no edema  Lobar Pnuemonia -continue cefepime and doxy -repeat PCT--0.17  COPD exacerbation -continue duonebs>>revefenacin nebs per pulm -continue pulmicort -added brovana -restarted IV solumedrol -continue mucinex -IS, flutter valve  Anxiety  -continue home dose klonopin 5 mg q 6 hrs prn anxiety -PMP Aware reviewed -continue celexa  Chronic pain syndrome -continue home dose percocet 7.5/325 and pregabalin  Hypokalemia -replete -check mag--2.2  Hyperlipidemia -noncompliant with statin  Tobacco abuse -cessation discussed -nicoderm patch      Status is: Inpatient  Remains inpatient appropriate because:IV treatments appropriate due to intensity of illness or inability to take PO   Dispo: The patient is from:Home Anticipated d/c is LK:GMWNto:Home Anticipated d/c date is: 3 days Patient currently Not medically    Family Communication:  no Family at bedside  Consultants:  none  Code Status:  FULL   DVT Prophylaxis:   [place] Lovenox   Procedures: As Listed in Progress Note Above  Antibiotics: Cefepime 12/26>> Doxy 12/26>>     Subjective: Patient states her breathing is not much better.  She becomes sob with minimal exertion. She complain of nonproductive cough, no hemoptysis.  Denies f/c, cp, n/v/d, abd pain  Objective: Vitals:   11/28/20 1200 11/28/20 1300 11/28/20 1345 11/28/20 1400  BP: 132/64 139/71  135/86  Pulse: 92 88 93 98  Resp: (!) 24 (!) 21 18 (!) 23  Temp:      TempSrc:      SpO2: 100% 100% 97% 97%  Weight:      Height:  No intake or output data in the 24 hours ending 11/28/20 1534 Weight change:  Exam:   General:  Pt is alert, follows commands  appropriately, not in acute distress  HEENT: No icterus, No thrush, No neck mass, [place]/AT  Cardiovascular: RRR, S1/S2, no rubs, no gallops  Respiratory: bilateral rales.  Minimal bibasilar wheeze  Abdomen: Soft/+BS, non tender, non distended, no guarding  Extremities: No edema, No lymphangitis, No petechiae, No rashes, no synovitis   Data Reviewed: I have personally reviewed following labs and imaging studies Basic Metabolic Panel: Recent Labs  Lab 11/23/20 0536 11/24/20 0701 11/25/20 0418 11/26/20 0235 11/27/20 0525 11/28/20 0624  NA  --  139 141 140 141 140  K  --  3.9 3.8 3.5 4.3 3.4*  CL  --  103 103 102 101 101  CO2  --  26 28 29 28 29   GLUCOSE  --  133* 106* 87 123* 92  BUN  --  7 9 9 12 16   CREATININE  --  0.46 0.42* 0.44 0.39* 0.39*  CALCIUM  --  7.7* 7.8* 7.9* 8.1* 8.5*  MG 2.8* 2.2 2.4 2.3 2.2 2.2  PHOS 2.4*  --   --   --   --   --    Liver Function Tests: Recent Labs  Lab 11/24/20 0701 11/25/20 0418 11/26/20 0235 11/27/20 0525 11/28/20 0624  AST 48* 35 34 24 17  ALT 34 31 28 27 26   ALKPHOS 124 107 106 91 97  BILITOT 0.6 0.4 0.6 0.7 0.6  PROT 5.9* 5.7* 5.8* 5.8* 6.3*  ALBUMIN 2.8* 2.6* 2.6* 2.6* 3.0*   No results for input(s): LIPASE, AMYLASE in the last 168 hours. No results for input(s): AMMONIA in the last 168 hours. Coagulation Profile: No results for input(s): INR, PROTIME in the last 168 hours. CBC: Recent Labs  Lab 11/24/20 0701 11/25/20 0418 11/26/20 0235 11/27/20 0525 11/28/20 0624  WBC 8.1 7.3 5.0 5.3 8.6  NEUTROABS 6.9 5.5 2.6 3.6 4.3  HGB 9.0* 9.4* 9.4* 9.1* 10.3*  HCT 29.8* 32.3* 31.6* 30.1* 34.3*  MCV 91.4 94.7 93.5 91.2 90.5  PLT 235 228 239 331 473*   Cardiac Enzymes: No results for input(s): CKTOTAL, CKMB, CKMBINDEX, TROPONINI in the last 168 hours. BNP: Invalid input(s): POCBNP CBG: No results for input(s): GLUCAP in the last 168 hours. HbA1C: No results for input(s): HGBA1C in the last 72 hours. Urine analysis:     Component Value Date/Time   COLORURINE YELLOW 07/12/2017 1455   APPEARANCEUR CLEAR 07/12/2017 1455   LABSPEC 1.005 07/12/2017 1455   PHURINE 8.0 07/12/2017 1455   GLUCOSEU NEGATIVE 07/12/2017 1455   HGBUR NEGATIVE 07/12/2017 1455   BILIRUBINUR NEGATIVE 07/12/2017 1455   KETONESUR 5 (A) 07/12/2017 1455   PROTEINUR NEGATIVE 07/12/2017 1455   NITRITE NEGATIVE 07/12/2017 1455   LEUKOCYTESUR NEGATIVE 07/12/2017 1455   Sepsis Labs: @LABRCNTIP (procalcitonin:4,lacticidven:4) ) Recent Results (from the past 240 hour(s))  Resp Panel by RT-PCR (Flu A&B, Covid) Nasopharyngeal Swab     Status: None   Collection Time: 11/22/20 10:15 PM   Specimen: Nasopharyngeal Swab; Nasopharyngeal(NP) swabs in vial transport medium  Result Value Ref Range Status   SARS Coronavirus 2 by RT PCR NEGATIVE NEGATIVE Final    Comment: (NOTE) SARS-CoV-2 target nucleic acids are NOT DETECTED.  The SARS-CoV-2 RNA is generally detectable in upper respiratory specimens during the acute phase of infection. The lowest concentration of SARS-CoV-2 viral copies this assay can detect is 138 copies/mL. A negative result does  not preclude SARS-Cov-2 infection and should not be used as the sole basis for treatment or other patient management decisions. A negative result may occur with  improper specimen collection/handling, submission of specimen other than nasopharyngeal swab, presence of viral mutation(s) within the areas targeted by this assay, and inadequate number of viral copies(<138 copies/mL). A negative result must be combined with clinical observations, patient history, and epidemiological information. The expected result is Negative.  Fact Sheet for Patients:  BloggerCourse.com  Fact Sheet for Healthcare Providers:  SeriousBroker.it  This test is no t yet approved or cleared by the [place] FDA and  has been authorized for detection and/or diagnosis of  SARS-CoV-2 by FDA under an Emergency Use Authorization (EUA). This EUA will remain  in effect (meaning this test can be used) for the duration of the COVID-19 declaration under Section 564(b)(1) of the Act, 21 U.S.C.section 360bbb-3(b)(1), unless the authorization is terminated  or revoked sooner.       Influenza A by PCR NEGATIVE NEGATIVE Final   Influenza B by PCR NEGATIVE NEGATIVE Final    Comment: (NOTE) The Xpert Xpress SARS-CoV-2/FLU/RSV plus assay is intended as an aid in the diagnosis of influenza from Nasopharyngeal swab specimens and should not be used as a sole basis for treatment. Nasal washings and aspirates are unacceptable for Xpert Xpress SARS-CoV-2/FLU/RSV testing.  Fact Sheet for Patients: BloggerCourse.com  Fact Sheet for Healthcare Providers: SeriousBroker.it  This test is not yet approved or cleared by the [place] FDA and has been authorized for detection and/or diagnosis of SARS-CoV-2 by FDA under an Emergency Use Authorization (EUA). This EUA will remain in effect (meaning this test can be used) for the duration of the COVID-19 declaration under Section 564(b)(1) of the Act, 21 U.S.C. section 360bbb-3(b)(1), unless the authorization is terminated or revoked.  Performed at [facility], [address] [place], [place] [address]   Blood Culture (routine x 2)     Status: None   Collection Time: 11/22/20 11:06 PM   Specimen: BLOOD RIGHT ARM  Result Value Ref Range Status   Specimen Description BLOOD RIGHT ARM  Final   Special Requests   Final    BOTTLES DRAWN AEROBIC AND ANAEROBIC Blood Culture adequate volume   Culture   Final    NO GROWTH 5 DAYS Performed at [facility], [address], [place], [place] [address]    Report Status 11/27/2020 FINAL  Final  Blood Culture (routine x 2)     Status: None   Collection Time: 11/22/20 11:20 PM   Specimen: Left Antecubital; Blood  Result Value Ref Range  Status   Specimen Description LEFT ANTECUBITAL  Final   Special Requests   Final    BOTTLES DRAWN AEROBIC AND ANAEROBIC Blood Culture adequate volume   Culture   Final    NO GROWTH 5 DAYS Performed at [facility], [address] [place], [place] [address]    Report Status 11/27/2020 FINAL  Final  Resp Panel by RT-PCR (Flu A&B, Covid) Nasopharyngeal Swab     Status: None   Collection Time: 11/23/20  8:54 AM   Specimen: Nasopharyngeal Swab; Nasopharyngeal(NP) swabs in vial transport medium  Result Value Ref Range Status   SARS Coronavirus 2 by RT PCR NEGATIVE NEGATIVE Final    Comment: (NOTE) SARS-CoV-2 target nucleic acids are NOT DETECTED.  The SARS-CoV-2 RNA is generally detectable in upper respiratory specimens during the acute phase of infection. The lowest concentration of SARS-CoV-2 viral copies this assay can  detect is 138 copies/mL. A negative result does not preclude SARS-Cov-2 infection and should not be used as the sole basis for treatment or other patient management decisions. A negative result may occur with  improper specimen collection/handling, submission of specimen other than nasopharyngeal swab, presence of viral mutation(s) within the areas targeted by this assay, and inadequate number of viral copies(<138 copies/mL). A negative result must be combined with clinical observations, patient history, and epidemiological information. The expected result is Negative.  Fact Sheet for Patients:  BloggerCourse.com  Fact Sheet for Healthcare Providers:  SeriousBroker.it  This test is no t yet approved or cleared by the [place] FDA and  has been authorized for detection and/or diagnosis of SARS-CoV-2 by FDA under an Emergency Use Authorization (EUA). This EUA will remain  in effect (meaning this test can be used) for the duration of the COVID-19 declaration under Section 564(b)(1) of the Act, 21 U.S.C.section  360bbb-3(b)(1), unless the authorization is terminated  or revoked sooner.       Influenza A by PCR NEGATIVE NEGATIVE Final   Influenza B by PCR NEGATIVE NEGATIVE Final    Comment: (NOTE) The Xpert Xpress SARS-CoV-2/FLU/RSV plus assay is intended as an aid in the diagnosis of influenza from Nasopharyngeal swab specimens and should not be used as a sole basis for treatment. Nasal washings and aspirates are unacceptable for Xpert Xpress SARS-CoV-2/FLU/RSV testing.  Fact Sheet for Patients: BloggerCourse.com  Fact Sheet for Healthcare Providers: SeriousBroker.it  This test is not yet approved or cleared by the [place] FDA and has been authorized for detection and/or diagnosis of SARS-CoV-2 by FDA under an Emergency Use Authorization (EUA). This EUA will remain in effect (meaning this test can be used) for the duration of the COVID-19 declaration under Section 564(b)(1) of the Act, 21 U.S.C. section 360bbb-3(b)(1), unless the authorization is terminated or revoked.  Performed at [facility], [address] [place], [place] [address]   MRSA PCR Screening     Status: None   Collection Time: 11/23/20  9:22 AM   Specimen: Nasal Mucosa; Nasopharyngeal  Result Value Ref Range Status   MRSA by PCR NEGATIVE NEGATIVE Final    Comment:        The GeneXpert MRSA Assay (FDA approved for NASAL specimens only), is one component of a comprehensive MRSA colonization surveillance program. It is not intended to diagnose MRSA infection nor to guide or monitor treatment for MRSA infections. Performed at [facility], [address], [place], [place] [address]      Scheduled Meds: . arformoterol  15 mcg Nebulization BID  . budesonide (PULMICORT) nebulizer solution  0.5 mg Nebulization BID  . Chlorhexidine Gluconate Cloth  6 each Topical Daily  . citalopram  40 mg Oral Daily  . doxycycline  100 mg Oral Q12H  . enoxaparin (LOVENOX)  injection  40 mg Subcutaneous Q24H  . feeding supplement  237 mL Oral BID BM  . guaiFENesin  1,200 mg Oral BID  . methylPREDNISolone (SOLU-MEDROL) injection  40 mg Intravenous Q12H  . multivitamin with minerals  1 tablet Oral Daily  . nicotine  21 mg Transdermal Daily  . pantoprazole  40 mg Oral Daily  . potassium chloride  10 mEq Oral BID  . pregabalin  200 mg Oral QHS  . revefenacin  175 mcg Nebulization Daily   Continuous Infusions:  Procedures/Studies: CT ANGIO CHEST PE W OR WO CONTRAST  Result Date: 11/23/2020 CLINICAL DATA:  Shortness of breath and left-sided chest pain with  decreased oxygen saturation EXAM: CT ANGIOGRAPHY CHEST WITH CONTRAST TECHNIQUE: Multidetector CT imaging of the chest was performed using the standard protocol during bolus administration of intravenous contrast. Multiplanar CT image reconstructions and MIPs were obtained to evaluate the vascular anatomy. CONTRAST:  69mL OMNIPAQUE IOHEXOL 350 MG/ML SOLN COMPARISON:  Chest x-ray from the previous day. FINDINGS: Cardiovascular: Thoracic aorta shows minimal atherosclerotic calcifications. No aneurysmal dilatation or dissection is noted. No cardiac enlargement is seen. The coronary arteries demonstrate mild calcification. Pulmonary artery is well visualized within normal branching pattern. No findings to suggest pulmonary emboli are noted. Mediastinum/Nodes: Thoracic inlet is within normal limits. Scattered hilar and subcarinal adenopathy is noted likely reactive in nature. The esophagus is within normal limits. Lungs/Pleura: Consolidation is noted in the right lower lobe as well as scattered predominately peripheral airspace opacities within both lungs. No sizable effusion is noted. No sizable parenchymal nodules are noted. Upper Abdomen: No acute abnormality noted. Musculoskeletal: No acute bony abnormality is noted Review of the MIP images confirms the above findings. IMPRESSION: Right lower lobe consolidation with patchy  ground-glass airspace opacities bilaterally consistent with multifocal pneumonia. Correlate with COVID-19 testing. No evidence of pulmonary emboli. Aortic Atherosclerosis (ICD10-I70.0). Electronically Signed   By: Alcide Clever M.D.   On: 11/23/2020 09:09   DG Chest Port 1 View  Result Date: 11/27/2020 CLINICAL DATA:  Hypoxemia.  Acute respiratory failure. EXAM: PORTABLE CHEST 1 VIEW COMPARISON:  One-view chest x-ray 11/25/2020 FINDINGS: Heart size is normal. Aeration at the lung bases is improving. Interstitial coarsening is again noted. IMPRESSION: Improving aeration at the lung bases bilaterally. Electronically Signed   By: Marin Roberts M.D.   On: 11/27/2020 16:03   DG CHEST PORT 1 VIEW  Result Date: 11/25/2020 CLINICAL DATA:  Shortness of breath.  History of COPD. EXAM: PORTABLE CHEST 1 VIEW COMPARISON:  Single-view of the chest 11/22/2020. PA and lateral chest 08/30/2018. CT chest 11/23/2020. FINDINGS: The lungs are emphysematous. Patchy bilateral airspace disease most compatible with pneumonia has progressed with new opacities seen in the upper lung zones bilaterally. No pneumothorax or pleural fluid. Heart size is normal. Aortic atherosclerosis. IMPRESSION: Patchy bilateral airspace disease compatible with plain film pneumonia has worsened since the most recent plain film comparison. Aortic Atherosclerosis (ICD10-I70.0) and Emphysema (ICD10-J43.9). Electronically Signed   By: Drusilla Kanner M.D.   On: 11/25/2020 10:23   DG Chest Portable 1 View  Result Date: 11/22/2020 CLINICAL DATA:  Shortness of breath, COPD. EXAM: PORTABLE CHEST 1 VIEW COMPARISON:  Chest x-ray 11/12/2019, CT chest 02/07/2020 FINDINGS: The heart size and mediastinal contours are within normal limits. Hyperinflation of the lungs consistent with emphysematous changes. Interval increase in interstitial markings diffusely but more prominent within the lower lobes. Hazy patchy bilateral lower lobe hazy airspace opacities.  No pleural effusion. No pneumothorax. No acute osseous abnormality. IMPRESSION: Increased interstitial markings and hazy airspace opacities more prominent within the lower lobes. Findings likely represent infection/inflammation. COVID-19 infection not excluded. Followup PA and lateral chest X-ray is recommended in 3-4 weeks following therapy to ensure resolution and exclude underlying malignancy. Electronically Signed   By: Tish Frederickson M.D.   On: 11/22/2020 21:40    Catarina Hartshorn, DO  [facility]  If 7PM-7AM, please contact night-coverage [email or web address] Password [email or web address] 11/28/2020, 3:34 PM   LOS: 5 days

## 2020-11-28 NOTE — Progress Notes (Signed)
NAME:  Debra [name], MRN:  [id number], DOB:  03/22/1966, LOS: 5 ADMISSION DATE:  11/22/2020, CONSULTATION DATE:  12/30 REFERRING MD:  Tat/triad, CHIEF COMPLAINT:  resp distress    Brief History:  36 yowf active smoker GOLD III criteria followed by [name] with lung nodule on dulera 200/ spriiva with acut worsening sob/ cough/ congestion x one week PTA and PCCM service asked to see by Dr Tat wit no response to aecop since admit 12/25   History of Present Illness:  54 y.o. female with medical history significant of anxiety, panic attacks, depression, osteoarthritis, chronic lower back pain, COPD/active smoker, GERD, history of hiatal hernia, history of bleeding ulcers, hypokalemia, history of pneumonia who came to emergency department with complaints of fever and progressively worse dyspneax 3 days PTA.   All other associated symptoms are occasionally productive of whitish sputum cough, fatigue, malaise, wheezing and headache.  No travel history or sick contacts.  No significant relief by medications at home.  She has doubled her O2 requirement from 3 LPM to 6 LPM with some improvement   ED Course: Initial vital signs were temperature 101.5 F, pulse 118, respirations 22, BP 104/86 mmHg and O2 sat 87% on 6 LPM via nasal cannula.     She received acetaminophen at 1000 mg p.o. x1, a 1000 mL NS bolus, 8 mg of dexamethasone IVP x1 and doxycycline 100 mg IVPB every 12 hours was started.  Labwork: Coronavirus 2 and influenza PCR was negative.  CBC showed a white count of 9.4 with 85% neutrophils, hemoglobin 10.0 g/dL and platelets 294.  Lactic acid was normal.  CMP showed normal sodium, chloride and CO2, but potassium level was 2.9 mmol/L.  Glucose 121 and calcium 7.5 mg/dL.  Renal function was normal.  Total protein 6.3, albumin 3.0 g/dL.  Alkaline phosphatase 130 units/L.  AST, ALT and total bilirubin were normal.  Past Medical History:    Anxiety  Arthritis Chronic lower back pain COPD (chronic  obstructive pulmonary disease) (HCC)  PFT's   08/30/18 FEV1 1.45 (49 % ) ratio 0.46  p 0 % improvement from saba p ? prior to study with DLCO  11.47 (42%) corrects to 2.13 (42%)  for alv volume and FV curve classic concavity   Depression GERD (gastroesophageal reflux disease)  History of bleeding ulcers (1994)   Significant Hospital Events:    Consults:  PCCM 12/30  Procedures:    Significant Diagnostic Tests:  CTa 11/23/20 Right lower lobe consolidation with patchy ground-glass airspace opacities bilaterally consistent with multifocal pneumonia. Correlate with COVID-19 testing. No evidence of pulmonary emboli.  Micro Data:  Resp viral panel PCR 12/25  > neg flu/ neg covid 19 BC x 2  12/25 > neg  MRSA PCR  12/26 > neg  Resp viral panel PCR 12/26  > neg flu/ neg covid 19 Urine strep 12/26 neg Urine legionella 12/ 26 neg   Antimicrobials:  Doxy  12/25 >>> Cefepime 12/26 >>>  Scheduled Meds: . arformoterol  15 mcg Nebulization BID  . budesonide (PULMICORT) nebulizer solution  0.5 mg Nebulization BID  . Chlorhexidine Gluconate Cloth  6 each Topical Daily  . citalopram  40 mg Oral Daily  . doxycycline  100 mg Oral Q12H  . enoxaparin (LOVENOX) injection  40 mg Subcutaneous Q24H  . feeding supplement  237 mL Oral BID BM  . guaiFENesin  1,200 mg Oral BID  . methylPREDNISolone (SOLU-MEDROL) injection  40 mg Intravenous Q12H  . multivitamin with minerals  1 tablet  Oral Daily  . nicotine  21 mg Transdermal Daily  . pantoprazole  40 mg Oral Daily  . potassium chloride  10 mEq Oral BID  . pregabalin  200 mg Oral QHS  . revefenacin  175 mcg Nebulization Daily   Continuous Infusions:  PRN Meds:.acetaminophen **OR** acetaminophen, albuterol, diazepam, naLOXone (NARCAN)  injection, nystatin cream, ondansetron **OR** ondansetron (ZOFRAN) IV, oxyCODONE-acetaminophen, sodium chloride, zolpidem     Interim History / Subjective:  Sob at rest, rattling cough/ has not tried bipap yet    Objective   Blood pressure 127/82, pulse 84, temperature 98.6 F (37 C), temperature source Oral, resp. rate (!) 21, height 5\' 7"  (1.702 m), weight 75 kg, SpO2 99 %.        Intake/Output Summary (Last 24 hours) at 11/28/2020 11/30/2020 Last data filed at 11/27/2020 1520 Gross per 24 hour  Intake 100 ml  Output --  Net 100 ml   Filed Weights   11/22/20 2137 11/24/20 1820 11/25/20 0500  Weight: 72.1 kg 75 kg 75 kg    Examination: Tmax  98.6  sats 98% on 10 lpm HFNC  Pt alert, anxious leaning over bedside table from sitting position in bed No jvd Oropharynx clear,  mucosa nl Neck supple Lungs with a  exp > insp rhonchi bilaterally RRR no s3 or or sign murmur Abd soft/ limited excursion  Extr warm with no edema or clubbing noted Neuro  Sensorium appears intact,  no apparent motor deficits       Resolved Hospital Problem list      Assessment & Plan:  1) CAP in pt with severe copd with acute on chronic resp failure  - PCT trending down on empirical rx as per abx flow sheet and cxr improved as of 12/30 >> no change rx  2) GOLD III COPD with very poor reserve/ still smoking PTA - high risk intubation and unlikely to come off with request she not be maint on vent long term  >>> avoid ET/ add bipap prn  >>> she has been on formorterol at home so should be able to tolerate it here > added brovana 12/31 and  yupelri 12/30 to budesonide rx   3) Acute hypoxemic resp failure >> goal is sats > 90% / bipap prn - could use combination of low dose precedex and bipap in this setting to avoid et   4) Probably developing protein calorie malnutrition >> rx per triad  5) apparent high chronic need for analgesics and anxiolytics may explain hyperventilation on ABGs 12/30 >>rx per triad, ok to use precedex for detox purposes if needed  - also clonidine might be good choice in low doses as would like to get her detoxed while in icu and shifted over to low dose maint rx that does not affect  resp drive/ cough mechanics  Best practice (evaluated daily)  Diet: per [facility] Pain/Anxiety/Delirium protocol (if indicated): per [facility] VAP protocol (if indicated): n/a DVT prophylaxis: per triad  GI prophylaxis: per triad  Glucose control: per triad  Mobility: out of bed as much as possible Disposition:ICU - desire not prolonged vent  "what's the poin of that if I'm not gonna get better"      Labs   CBC: Recent Labs  Lab 11/24/20 0701 11/25/20 0418 11/26/20 0235 11/27/20 0525 11/28/20 0624  WBC 8.1 7.3 5.0 5.3 8.6  NEUTROABS 6.9 5.5 2.6 3.6 4.3  HGB 9.0* 9.4* 9.4* 9.1* 10.3*  HCT 29.8* 32.3* 31.6* 30.1* 34.3*  MCV 91.4 94.7  93.5 91.2 90.5  PLT 235 228 239 331 473*    Basic Metabolic Panel: Recent Labs  Lab 11/23/20 0536 11/24/20 0701 11/25/20 0418 11/26/20 0235 11/27/20 0525 11/28/20 0624  NA  --  139 141 140 141 140  K  --  3.9 3.8 3.5 4.3 3.4*  CL  --  103 103 102 101 101  CO2  --  26 28 29 28 29   GLUCOSE  --  133* 106* 87 123* 92  BUN  --  7 9 9 12 16   CREATININE  --  0.46 0.42* 0.44 0.39* 0.39*  CALCIUM  --  7.7* 7.8* 7.9* 8.1* 8.5*  MG 2.8* 2.2 2.4 2.3 2.2 2.2  PHOS 2.4*  --   --   --   --   --    GFR: Estimated Creatinine Clearance: 85 mL/min (A) (by C-G formula based on SCr of 0.39 mg/dL (L)). Recent Labs  Lab 11/22/20 2215 11/22/20 2307 11/24/20 0701 11/25/20 0418 11/26/20 0235 11/27/20 0525 11/28/20 0624  PROCALCITON 4.45  --   --   --   --  0.17  --   WBC 9.4  --    < > 7.3 5.0 5.3 8.6  LATICACIDVEN 1.5 0.9  --   --   --   --   --    < > = values in this interval not displayed.    Liver Function Tests: Recent Labs  Lab 11/24/20 0701 11/25/20 0418 11/26/20 0235 11/27/20 0525 11/28/20 0624  AST 48* 35 34 24 17  ALT 34 31 28 27 26   ALKPHOS 124 107 106 91 97  BILITOT 0.6 0.4 0.6 0.7 0.6  PROT 5.9* 5.7* 5.8* 5.8* 6.3*  ALBUMIN 2.8* 2.6* 2.6* 2.6* 3.0*   No results for input(s): LIPASE, AMYLASE in the last 168 hours. No results  for input(s): AMMONIA in the last 168 hours.  ABG    Component Value Date/Time   PHART 7.501 (H) 11/27/2020 1610   PCO2ART 35.8 11/27/2020 1610   PO2ART 74.3 (L) 11/27/2020 1610   HCO3 28.7 (H) 11/27/2020 1610   TCO2 30 07/25/2020 1525   O2SAT 96.1 11/27/2020 1610     Coagulation Profile: No results for input(s): INR, PROTIME in the last 168 hours.  Cardiac Enzymes: No results for input(s): CKTOTAL, CKMB, CKMBINDEX, TROPONINI in the last 168 hours.  HbA1C: No results found for: HGBA1C  CBG: No results for input(s): GLUCAP in the last 168 hours.    The patient is critically ill with multiple organ systems failure and requires high complexity decision making for assessment and support, frequent evaluation and titration of therapies, application of advanced monitoring technologies and extensive interpretation of multiple databases. Critical Care Time devoted to patient care services described in this note is 35 minutes.    11/29/2020, MD Pulmonary and Critical Care Medicine [facility] Cell [phone]   After 7:00 pm call Elink  [phone]

## 2020-11-29 DIAGNOSIS — Z720 Tobacco use: Secondary | ICD-10-CM | POA: Diagnosis not present

## 2020-11-29 DIAGNOSIS — E876 Hypokalemia: Secondary | ICD-10-CM | POA: Diagnosis not present

## 2020-11-29 DIAGNOSIS — J181 Lobar pneumonia, unspecified organism: Secondary | ICD-10-CM | POA: Diagnosis not present

## 2020-11-29 DIAGNOSIS — J9621 Acute and chronic respiratory failure with hypoxia: Secondary | ICD-10-CM | POA: Diagnosis not present

## 2020-11-29 LAB — CBC
HCT: 37.8 % (ref 36.0–46.0)
Hemoglobin: 11.4 g/dL — ABNORMAL LOW (ref 12.0–15.0)
MCH: 27.2 pg (ref 26.0–34.0)
MCHC: 30.2 g/dL (ref 30.0–36.0)
MCV: 90.2 fL (ref 80.0–100.0)
Platelets: 619 10*3/uL — ABNORMAL HIGH (ref 150–400)
RBC: 4.19 MIL/uL (ref 3.87–5.11)
RDW: 17.3 % — ABNORMAL HIGH (ref 11.5–15.5)
WBC: 9.1 10*3/uL (ref 4.0–10.5)
nRBC: 0 % (ref 0.0–0.2)

## 2020-11-29 LAB — BASIC METABOLIC PANEL
Anion gap: 11 (ref 5–15)
BUN: 15 mg/dL (ref 6–20)
CO2: 29 mmol/L (ref 22–32)
Calcium: 8.7 mg/dL — ABNORMAL LOW (ref 8.9–10.3)
Chloride: 99 mmol/L (ref 98–111)
Creatinine, Ser: 0.41 mg/dL — ABNORMAL LOW (ref 0.44–1.00)
GFR, Estimated: >60 mL/min (ref >60)
Glucose, Bld: 138 mg/dL — ABNORMAL HIGH (ref 70–99)
Potassium: 3.9 mmol/L (ref 3.5–5.1)
Sodium: 139 mmol/L (ref 135–145)

## 2020-11-29 LAB — MAGNESIUM: Magnesium: 2.1 mg/dL (ref 1.7–2.4)

## 2020-11-29 LAB — PHOSPHORUS: Phosphorus: 4.2 mg/dL (ref 2.5–4.6)

## 2020-11-29 MED ORDER — SODIUM CHLORIDE 0.9 % IV SOLN
2.0000 g | Freq: Three times a day (TID) | INTRAVENOUS | Status: AC
  Administered 2020-11-29 – 2020-12-01 (×6): 2 g via INTRAVENOUS
  Filled 2020-11-29 (×6): qty 2

## 2020-11-29 NOTE — Progress Notes (Signed)
10:25pm-11/28/20 RN called due to patient requesting to leave the hospital, she also wants to be transferred to [facility]. At bedside, patient continues to have increased work of breathing when she talks, though she was able to maintain O2 sat ranging from 94-99% at bedside.  She was also very anxious. I explained to patient regarding severity of her COPD status and PCCM concern regarding intubation.  She continued to refuse steroid and BiPAP but cooperates with other medications. She states that her brother will come in the morning with goal to be transferred to [facility] for further management including possible intubation.

## 2020-11-29 NOTE — Progress Notes (Signed)
PROGRESS NOTE  Debra [name] NWG:[id number] DOB: 09-Aug-1966 DOA: 11/22/2020 PCP: [name], MD   Brief History: 55 y.o.femalewith medical history significant ofanxiety, panic attacks, depression, osteoarthritis, chronic lower back pain, COPD/active smoker, GERD, history of hiatal hernia, history of bleeding ulcers, hypokalemia, history of pneumonia who is coming to the emergency department with complaints of fever and progressively worse dyspnea for the past 3 days.All other associated symptoms are occasionally productive of whitish sputum cough, fatigue, malaise, wheezing and headache. No travel history or sick contacts. No significant relief by medications at home. She has doubledher O2 requirement from 3 LPM to 6 LPM with some improvement. She mentions that she is supposed to use her oxygen at night, but for the past 3 days she has been using it during daytime as well.  ED Course:Initial vital signs were temperature101.5 F, pulse 118, respirations 22, BP 104/86 mmHg and O2 sat 87% on 6 LPM via nasal cannula. The patient is currently on 10 LPM via HFNC. He received acetaminophen at 1000 mg p.o. x1, a 1000 mL NS bolus, 8 mg of dexamethasone IVP x1 and doxycycline 100 mg IVPB every 12 hours was started.  Labwork: Coronavirus 2 and influenza PCR was negative. CBC showed a white count of 9.4 with 85% neutrophils, hemoglobin 10.0 g/dL and platelets 578. Lactic acid was normal. CMP showed normal sodium, chloride and CO2, but potassium level was 2.9 mmol/L. Glucose 121 and calcium 7.5 mg/dL. Renal function was normal. Total protein 6.3, albumin 3.0 g/dL. Alkaline phosphatase 130 units/L. AST, ALT and total bilirubin were normal.  Assessment/Plan: Acute on chronic Respiratory Failure with Hypoxia -due to pneumonia and COPD exacerbation -chronically on 3L at home at night -12/26 CTA chest--no PE; RLL consolidation with bilateral patchy GGO -wean oxygen for  saturation >90% -pulmonary consult appreciated--discussed with Dr. [name] hypnotic medications if possible -may need prn BiPAP with precedex -12/30 personally reviewed CXR--improving opacities, no edema -OVERALL, I FEEL PATIENT IS SLOWLY IMPROVING MEDICALLY -OVERALL OXYGENATION IMPROVING SLOWLY WITH SATURATIONS 90-95% ON 6-7L -intermittent episodes of desaturation into 70-80s partly driven by her anxiety and taking off her own oxygen and intermittent medication refusal   Lobar Pnuemonia -continue cefepime and doxy -repeat PCT--0.17  COPD exacerbation -continue duonebs>>revefenacin nebs per pulm -continue pulmicort -added brovana -restartedIV solumedrol -continue mucinex -IS, flutter valve  Anxiety  -continue home dose klonopin 5 mg q 6 hrs prn anxiety -PMP Aware reviewed -continue celexa  Chronic pain syndrome -continue home dose percocet 7.5/325 and pregabalin  Hypokalemia -replete -check mag--2.1  Hyperlipidemia -noncompliant with statin  Tobacco abuse -cessation discussed -nicoderm patch      Status is: Inpatient  Remains inpatient appropriate because:IV treatments appropriate due to intensity of illness or inability to take PO   Dispo: The patient is from:Home Anticipated d/c is IO:[facility] Anticipated d/c date is:3 days Patient currently Not medically   Family Communication:mother updated at bedside 11/29/20  Consultants:none  Code Status: FULL   DVT Prophylaxis:  Bibb Lovenox   Procedures: As Listed in Progress Note Above  Antibiotics: Cefepime 12/26>> Doxy 12/26>>   Total time spent 35 minutes.  Greater than 50% spent face to face counseling and coordinating care.    Subjective: Patient continues to complain of sob.  She complains of nasal congestion.  Denies f/c, cp,  n/v/d  Objective: Vitals:   11/29/20 0900 11/29/20 1000 11/29/20 1100 11/29/20 1150  BP:  (!) 130/59 133/62 138/61   Pulse: 89 75 (!)  101 93  Resp: (!) 23 16 (!) 23 (!) 23  Temp:    97.9 F (36.6 C)  TempSrc:    Oral  SpO2: 94% 96% 96% 94%  Weight:      Height:        Intake/Output Summary (Last 24 hours) at 11/29/2020 1230 Last data filed at 11/29/2020 0800 Gross per 24 hour  Intake 480 ml  Output --  Net 480 ml   Weight change:  Exam:   General:  Pt is alert, follows commands appropriately, not in acute distress  HEENT: No icterus, No thrush, No neck mass, [place]/AT  Cardiovascular: RRR, S1/S2, no rubs, no gallops  Respiratory: bilateral rales R>L.  Bibasilar exp wheeze  Abdomen: Soft/+BS, non tender, non distended, no guarding  Extremities: No edema, No lymphangitis, No petechiae, No rashes, no synovitis   Data Reviewed: I have personally reviewed following labs and imaging studies Basic Metabolic Panel: Recent Labs  Lab 11/23/20 0536 11/24/20 0701 11/25/20 0418 11/26/20 0235 11/27/20 0525 11/28/20 0624 11/29/20 0422  NA  --    < > 141 140 141 140 139  K  --    < > 3.8 3.5 4.3 3.4* 3.9  CL  --    < > 103 102 101 101 99  CO2  --    < > 28 29 28 29 29   GLUCOSE  --    < > 106* 87 123* 92 138*  BUN  --    < > 9 9 12 16 15   CREATININE  --    < > 0.42* 0.44 0.39* 0.39* 0.41*  CALCIUM  --    < > 7.8* 7.9* 8.1* 8.5* 8.7*  MG 2.8*   < > 2.4 2.3 2.2 2.2 2.1  PHOS 2.4*  --   --   --   --   --  4.2   < > = values in this interval not displayed.   Liver Function Tests: Recent Labs  Lab 11/24/20 0701 11/25/20 0418 11/26/20 0235 11/27/20 0525 11/28/20 0624  AST 48* 35 34 24 17  ALT 34 31 28 27 26   ALKPHOS 124 107 106 91 97  BILITOT 0.6 0.4 0.6 0.7 0.6  PROT 5.9* 5.7* 5.8* 5.8* 6.3*  ALBUMIN 2.8* 2.6* 2.6* 2.6* 3.0*   No results for input(s): LIPASE, AMYLASE in the last 168 hours. No results for input(s): AMMONIA in the last 168 hours. Coagulation Profile: No results for input(s): INR, PROTIME in the last 168 hours. CBC: Recent Labs  Lab  11/24/20 0701 11/25/20 0418 11/26/20 0235 11/27/20 0525 11/28/20 0624 11/29/20 0422  WBC 8.1 7.3 5.0 5.3 8.6 9.1  NEUTROABS 6.9 5.5 2.6 3.6 4.3  --   HGB 9.0* 9.4* 9.4* 9.1* 10.3* 11.4*  HCT 29.8* 32.3* 31.6* 30.1* 34.3* 37.8  MCV 91.4 94.7 93.5 91.2 90.5 90.2  PLT 235 228 239 331 473* 619*   Cardiac Enzymes: No results for input(s): CKTOTAL, CKMB, CKMBINDEX, TROPONINI in the last 168 hours. BNP: Invalid input(s): POCBNP CBG: No results for input(s): GLUCAP in the last 168 hours. HbA1C: No results for input(s): HGBA1C in the last 72 hours. Urine analysis:    Component Value Date/Time   COLORURINE YELLOW 07/12/2017 1455   APPEARANCEUR CLEAR 07/12/2017 1455   LABSPEC 1.005 07/12/2017 1455   PHURINE 8.0 07/12/2017 1455   GLUCOSEU NEGATIVE 07/12/2017 1455   HGBUR NEGATIVE 07/12/2017 1455   BILIRUBINUR NEGATIVE 07/12/2017 1455   KETONESUR 5 (A) 07/12/2017 1455   PROTEINUR  NEGATIVE 07/12/2017 1455   NITRITE NEGATIVE 07/12/2017 1455   LEUKOCYTESUR NEGATIVE 07/12/2017 1455   Sepsis Labs: @LABRCNTIP (procalcitonin:4,lacticidven:4) ) Recent Results (from the past 240 hour(s))  Resp Panel by RT-PCR (Flu A&B, Covid) Nasopharyngeal Swab     Status: None   Collection Time: 11/22/20 10:15 PM   Specimen: Nasopharyngeal Swab; Nasopharyngeal(NP) swabs in vial transport medium  Result Value Ref Range Status   SARS Coronavirus 2 by RT PCR NEGATIVE NEGATIVE Final    Comment: (NOTE) SARS-CoV-2 target nucleic acids are NOT DETECTED.  The SARS-CoV-2 RNA is generally detectable in upper respiratory specimens during the acute phase of infection. The lowest concentration of SARS-CoV-2 viral copies this assay can detect is 138 copies/mL. A negative result does not preclude SARS-Cov-2 infection and should not be used as the sole basis for treatment or other patient management decisions. A negative result may occur with  improper specimen collection/handling, submission of specimen  other than nasopharyngeal swab, presence of viral mutation(s) within the areas targeted by this assay, and inadequate number of viral copies(<138 copies/mL). A negative result must be combined with clinical observations, patient history, and epidemiological information. The expected result is Negative.  Fact Sheet for Patients:  EntrepreneurPulse.com.au  Fact Sheet for Healthcare Providers:  IncredibleEmployment.be  This test is no t yet approved or cleared by the [place] FDA and  has been authorized for detection and/or diagnosis of SARS-CoV-2 by FDA under an Emergency Use Authorization (EUA). This EUA will remain  in effect (meaning this test can be used) for the duration of the COVID-19 declaration under Section 564(b)(1) of the Act, 21 U.S.C.section 360bbb-3(b)(1), unless the authorization is terminated  or revoked sooner.       Influenza A by PCR NEGATIVE NEGATIVE Final   Influenza B by PCR NEGATIVE NEGATIVE Final    Comment: (NOTE) The Xpert Xpress SARS-CoV-2/FLU/RSV plus assay is intended as an aid in the diagnosis of influenza from Nasopharyngeal swab specimens and should not be used as a sole basis for treatment. Nasal washings and aspirates are unacceptable for Xpert Xpress SARS-CoV-2/FLU/RSV testing.  Fact Sheet for Patients: EntrepreneurPulse.com.au  Fact Sheet for Healthcare Providers: IncredibleEmployment.be  This test is not yet approved or cleared by the [place] FDA and has been authorized for detection and/or diagnosis of SARS-CoV-2 by FDA under an Emergency Use Authorization (EUA). This EUA will remain in effect (meaning this test can be used) for the duration of the COVID-19 declaration under Section 564(b)(1) of the Act, 21 U.S.C. section 360bbb-3(b)(1), unless the authorization is terminated or revoked.  Performed at [facility], [address] [place], [place]  [address]   Blood Culture (routine x 2)     Status: None   Collection Time: 11/22/20 11:06 PM   Specimen: BLOOD RIGHT ARM  Result Value Ref Range Status   Specimen Description BLOOD RIGHT ARM  Final   Special Requests   Final    BOTTLES DRAWN AEROBIC AND ANAEROBIC Blood Culture adequate volume   Culture   Final    NO GROWTH 5 DAYS Performed at [facility], [address] [place], [place] [address]    Report Status 11/27/2020 FINAL  Final  Blood Culture (routine x 2)     Status: None   Collection Time: 11/22/20 11:20 PM   Specimen: Left Antecubital; Blood  Result Value Ref Range Status   Specimen Description LEFT ANTECUBITAL  Final   Special Requests   Final    BOTTLES DRAWN AEROBIC AND ANAEROBIC Blood Culture  adequate volume   Culture   Final    NO GROWTH 5 DAYS Performed at [facility], [address] [place], [place] [address]    Report Status 11/27/2020 FINAL  Final  Resp Panel by RT-PCR (Flu A&B, Covid) Nasopharyngeal Swab     Status: None   Collection Time: 11/23/20  8:54 AM   Specimen: Nasopharyngeal Swab; Nasopharyngeal(NP) swabs in vial transport medium  Result Value Ref Range Status   SARS Coronavirus 2 by RT PCR NEGATIVE NEGATIVE Final    Comment: (NOTE) SARS-CoV-2 target nucleic acids are NOT DETECTED.  The SARS-CoV-2 RNA is generally detectable in upper respiratory specimens during the acute phase of infection. The lowest concentration of SARS-CoV-2 viral copies this assay can detect is 138 copies/mL. A negative result does not preclude SARS-Cov-2 infection and should not be used as the sole basis for treatment or other patient management decisions. A negative result may occur with  improper specimen collection/handling, submission of specimen other than nasopharyngeal swab, presence of viral mutation(s) within the areas targeted by this assay, and inadequate number of viral copies(<138 copies/mL). A negative result must be combined with clinical observations,  patient history, and epidemiological information. The expected result is Negative.  Fact Sheet for Patients:  BloggerCourse.com  Fact Sheet for Healthcare Providers:  SeriousBroker.it  This test is no t yet approved or cleared by the [place] FDA and  has been authorized for detection and/or diagnosis of SARS-CoV-2 by FDA under an Emergency Use Authorization (EUA). This EUA will remain  in effect (meaning this test can be used) for the duration of the COVID-19 declaration under Section 564(b)(1) of the Act, 21 U.S.C.section 360bbb-3(b)(1), unless the authorization is terminated  or revoked sooner.       Influenza A by PCR NEGATIVE NEGATIVE Final   Influenza B by PCR NEGATIVE NEGATIVE Final    Comment: (NOTE) The Xpert Xpress SARS-CoV-2/FLU/RSV plus assay is intended as an aid in the diagnosis of influenza from Nasopharyngeal swab specimens and should not be used as a sole basis for treatment. Nasal washings and aspirates are unacceptable for Xpert Xpress SARS-CoV-2/FLU/RSV testing.  Fact Sheet for Patients: BloggerCourse.com  Fact Sheet for Healthcare Providers: SeriousBroker.it  This test is not yet approved or cleared by the [place] FDA and has been authorized for detection and/or diagnosis of SARS-CoV-2 by FDA under an Emergency Use Authorization (EUA). This EUA will remain in effect (meaning this test can be used) for the duration of the COVID-19 declaration under Section 564(b)(1) of the Act, 21 U.S.C. section 360bbb-3(b)(1), unless the authorization is terminated or revoked.  Performed at [facility], [address], [place], [place] [address]   MRSA PCR Screening     Status: None   Collection Time: 11/23/20  9:22 AM   Specimen: Nasal Mucosa; Nasopharyngeal  Result Value Ref Range Status   MRSA by PCR NEGATIVE NEGATIVE Final    Comment:        The  GeneXpert MRSA Assay (FDA approved for NASAL specimens only), is one component of a comprehensive MRSA colonization surveillance program. It is not intended to diagnose MRSA infection nor to guide or monitor treatment for MRSA infections. Performed at [facility], [address], [place], [place] [address]      Scheduled Meds: . arformoterol  15 mcg Nebulization BID  . budesonide (PULMICORT) nebulizer solution  0.5 mg Nebulization BID  . Chlorhexidine Gluconate Cloth  6 each Topical Daily  . citalopram  40 mg Oral Daily  .  doxycycline  100 mg Oral Q12H  . enoxaparin (LOVENOX) injection  40 mg Subcutaneous Q24H  . feeding supplement  237 mL Oral BID BM  . guaiFENesin  1,200 mg Oral BID  . methylPREDNISolone (SOLU-MEDROL) injection  40 mg Intravenous Q12H  . multivitamin with minerals  1 tablet Oral Daily  . nicotine  21 mg Transdermal Daily  . oxymetazoline  1 spray Each Nare BID  . pantoprazole  40 mg Oral Daily  . potassium chloride  10 mEq Oral BID  . pregabalin  200 mg Oral QHS  . revefenacin  175 mcg Nebulization Daily   Continuous Infusions:  Procedures/Studies: CT ANGIO CHEST PE W OR WO CONTRAST  Result Date: 11/23/2020 CLINICAL DATA:  Shortness of breath and left-sided chest pain with decreased oxygen saturation EXAM: CT ANGIOGRAPHY CHEST WITH CONTRAST TECHNIQUE: Multidetector CT imaging of the chest was performed using the standard protocol during bolus administration of intravenous contrast. Multiplanar CT image reconstructions and MIPs were obtained to evaluate the vascular anatomy. CONTRAST:  61mL OMNIPAQUE IOHEXOL 350 MG/ML SOLN COMPARISON:  Chest x-ray from the previous day. FINDINGS: Cardiovascular: Thoracic aorta shows minimal atherosclerotic calcifications. No aneurysmal dilatation or dissection is noted. No cardiac enlargement is seen. The coronary arteries demonstrate mild calcification. Pulmonary artery is well visualized within normal branching pattern. No  findings to suggest pulmonary emboli are noted. Mediastinum/Nodes: Thoracic inlet is within normal limits. Scattered hilar and subcarinal adenopathy is noted likely reactive in nature. The esophagus is within normal limits. Lungs/Pleura: Consolidation is noted in the right lower lobe as well as scattered predominately peripheral airspace opacities within both lungs. No sizable effusion is noted. No sizable parenchymal nodules are noted. Upper Abdomen: No acute abnormality noted. Musculoskeletal: No acute bony abnormality is noted Review of the MIP images confirms the above findings. IMPRESSION: Right lower lobe consolidation with patchy ground-glass airspace opacities bilaterally consistent with multifocal pneumonia. Correlate with COVID-19 testing. No evidence of pulmonary emboli. Aortic Atherosclerosis (ICD10-I70.0). Electronically Signed   By: Alcide Clever M.D.   On: 11/23/2020 09:09   DG Chest Port 1 View  Result Date: 11/27/2020 CLINICAL DATA:  Hypoxemia.  Acute respiratory failure. EXAM: PORTABLE CHEST 1 VIEW COMPARISON:  One-view chest x-ray 11/25/2020 FINDINGS: Heart size is normal. Aeration at the lung bases is improving. Interstitial coarsening is again noted. IMPRESSION: Improving aeration at the lung bases bilaterally. Electronically Signed   By: Marin Roberts M.D.   On: 11/27/2020 16:03   DG CHEST PORT 1 VIEW  Result Date: 11/25/2020 CLINICAL DATA:  Shortness of breath.  History of COPD. EXAM: PORTABLE CHEST 1 VIEW COMPARISON:  Single-view of the chest 11/22/2020. PA and lateral chest 08/30/2018. CT chest 11/23/2020. FINDINGS: The lungs are emphysematous. Patchy bilateral airspace disease most compatible with pneumonia has progressed with new opacities seen in the upper lung zones bilaterally. No pneumothorax or pleural fluid. Heart size is normal. Aortic atherosclerosis. IMPRESSION: Patchy bilateral airspace disease compatible with plain film pneumonia has worsened since the most  recent plain film comparison. Aortic Atherosclerosis (ICD10-I70.0) and Emphysema (ICD10-J43.9). Electronically Signed   By: Drusilla Kanner M.D.   On: 11/25/2020 10:23   DG Chest Portable 1 View  Result Date: 11/22/2020 CLINICAL DATA:  Shortness of breath, COPD. EXAM: PORTABLE CHEST 1 VIEW COMPARISON:  Chest x-ray 11/12/2019, CT chest 02/07/2020 FINDINGS: The heart size and mediastinal contours are within normal limits. Hyperinflation of the lungs consistent with emphysematous changes. Interval increase in interstitial markings diffusely but more prominent within the lower  lobes. Hazy patchy bilateral lower lobe hazy airspace opacities. No pleural effusion. No pneumothorax. No acute osseous abnormality. IMPRESSION: Increased interstitial markings and hazy airspace opacities more prominent within the lower lobes. Findings likely represent infection/inflammation. COVID-19 infection not excluded. Followup PA and lateral chest X-ray is recommended in 3-4 weeks following therapy to ensure resolution and exclude underlying malignancy. Electronically Signed   By: Tish Frederickson M.D.   On: 11/22/2020 21:40    Catarina Hartshorn, DO  [facility]  If 7PM-7AM, please contact night-coverage [email or web address] Password [email or web address] 11/29/2020, 12:30 PM   LOS: 6 days

## 2020-11-30 ENCOUNTER — Inpatient Hospital Stay (HOSPITAL_COMMUNITY): Payer: Medicaid Other

## 2020-11-30 DIAGNOSIS — J9601 Acute respiratory failure with hypoxia: Secondary | ICD-10-CM | POA: Diagnosis not present

## 2020-11-30 LAB — CREATININE, SERUM
Creatinine, Ser: 0.42 mg/dL — ABNORMAL LOW (ref 0.44–1.00)
GFR, Estimated: >60 mL/min (ref >60)

## 2020-11-30 MED ORDER — MIRTAZAPINE 15 MG PO TBDP
15.0000 mg | ORAL_TABLET | Freq: Every day | ORAL | Status: DC
  Administered 2020-11-30 – 2020-12-04 (×5): 15 mg via ORAL
  Filled 2020-11-30 (×8): qty 1

## 2020-11-30 MED ORDER — BUSPIRONE HCL 5 MG PO TABS
7.5000 mg | ORAL_TABLET | Freq: Three times a day (TID) | ORAL | Status: DC
  Administered 2020-11-30 – 2020-12-02 (×6): 7.5 mg via ORAL
  Filled 2020-11-30 (×6): qty 2

## 2020-11-30 NOTE — Progress Notes (Signed)
PROGRESS NOTE  Debra [name] GUR:[id number] DOB: Mar 03, 1966 DOA: 11/22/2020 PCP: [name], MD   Brief History: 55 y.o.femalewith medical history significant ofanxiety, panic attacks, depression, osteoarthritis, chronic lower back pain, COPD/active smoker, GERD, history of hiatal hernia, history of bleeding ulcers, hypokalemia, history of pneumonia who is coming to the emergency department with complaints of fever and progressively worse dyspnea for the past 3 days.All other associated symptoms are occasionally productive of whitish sputum cough, fatigue, malaise, wheezing and headache. No travel history or sick contacts. No significant relief by medications at home. She has doubledher O2 requirement from 3 LPM to 6 LPM with some improvement. She mentions that she is supposed to use her oxygen at night, but for the past 3 days she has been using it during daytime as well.  ED Course:Initial vital signs were temperature101.5 F, pulse 118, respirations 22, BP 104/86 mmHg and O2 sat 87% on 6 LPM via nasal cannula. The patient is currently on 10 LPM via HFNC. He received acetaminophen at 1000 mg p.o. x1, a 1000 mL NS bolus, 8 mg of dexamethasone IVP x1 and doxycycline 100 mg IVPB every 12 hours was started.  Labwork: Coronavirus 2 and influenza PCR was negative. CBC showed a white count of 9.4 with 85% neutrophils, hemoglobin 10.0 g/dL and platelets 283. Lactic acid was normal. CMP showed normal sodium, chloride and CO2, but potassium level was 2.9 mmol/L. Glucose 121 and calcium 7.5 mg/dL. Renal function was normal. Total protein 6.3, albumin 3.0 g/dL. Alkaline phosphatase 130 units/L. AST, ALT and total bilirubin were normal.  Assessment/Plan: Acute on chronic Respiratory Failure with Hypoxia -due to pneumonia and COPD exacerbation -chronically on 3 to 4L at home PTA -12/26 CTA chest--no PE; RLL consolidation with bilateral patchy GGO -wean oxygen for  saturation >90% -pulmonary consult appreciated-- Dr. [name] hypnotic medications if possible -may need prn BiPAP with precedex -12/30 personally reviewed CXR--improving opacities, no edema -Overall respiratory status improving slowly, oxygen requirement down to 6 L via nasal cannula from 11 L this admission -Plan for repeat chest x-ray on 12/02/2019 -Continue Solu-Medrol IV  Lobar Pnuemonia -continue cefepime and doxy--loculated cefepime will be 12/01/2020 for a total of 9 days -Last dose of doxycycline will also be 12/01/2020 -repeat PCT--0.17 -Repeat chest x-ray on 12/01/2020  COPD exacerbation -continue duonebs>>revefenacin nebs per pulm -continue pulmicort -added brovana --ContinueIV solumedrol -continue mucinex -IS, flutter valve  Anxiety ---Remains Very anxious with panic attack episodes -continue home dose klonopin 5 mg q 6 hrs prn anxiety -PMP Aware reviewed -continue celexa, add BuSpar 5 mg 3 times daily -Also add Remeron 50 mg nightly as patient has anorexia along with anxiety  Chronic pain syndrome -continue home dose percocet 7.5/325 and pregabalin -Patient is on benzos, avoid over aggressive escalation of opiates and benzo to avoid respiratory depression  Hypokalemia -replete -check mag--2.1  Hyperlipidemia -noncompliant with statin  Tobacco abuse -cessation discussed -nicoderm patch  --Generalized weakness and deconditioning----get PT eval  Status is: Inpatient  Remains inpatient appropriate because: Continues to have significant shortness of breath, oxygen requirement much higher than baseline, requiring IV steroids and IV antibiotics Dispo: The patient is from:Home Anticipated d/c is TD:[facility] Vs SNF Anticipated d/c date is:3 days Patient currently Not medically   Family Communication:mother previously updated   Consultants:PCCM--  Code Status: FULL   DVT Prophylaxis:  [place]  Lovenox   Procedures: As Listed in Progress Note Above  Antibiotics: Cefepime 12/26>> Doxy 12/26>>  Subjective: - Increased oxygen requirement compared to baseline, cough and shortness of breath persist significant dyspnea on exertion -Very anxious with panic attack episodes  Objective: Vitals:   11/30/20 0900 11/30/20 1000 11/30/20 1100 11/30/20 1115  BP: (!) 142/72 (!) 163/77 (!) 167/77   Pulse: 98 92 99 97  Resp: (!) 23 (!) 21 (!) 27 (!) 24  Temp:    (!) 97.5 F (36.4 C)  TempSrc:    Oral  SpO2: (!) 88% 91% 90% 93%  Weight:      Height:        Intake/Output Summary (Last 24 hours) at 11/30/2020 1205 Last data filed at 11/30/2020 0949 Gross per 24 hour  Intake 1120 ml  Output 2100 ml  Net -980 ml   Weight change:  Exam:   General:  Pt is alert, follows commands appropriately, +ve DOE   HEENT: No icterus, No thrush, No neck mass, [place]/AT  Nose- 6L/min  Cardiovascular: RRR, S1/S2, no rubs, no gallops  Respiratory: Fair air movement bilaterally, few scattered rhonchi, no wheezing   abdomen: Soft/+BS, non tender, non distended, no guarding  Extremities: No edema, No lymphangitis, No petechiae, No rashes, no synovitis  Psych--very, very anxious, alert oriented x3   Data Reviewed: I have personally reviewed following labs and imaging studies Basic Metabolic Panel: Recent Labs  Lab 11/25/20 0418 11/26/20 0235 11/27/20 0525 11/28/20 0624 11/29/20 0422 11/30/20 0507  NA 141 140 141 140 139  --   K 3.8 3.5 4.3 3.4* 3.9  --   CL 103 102 101 101 99  --   CO2 28 29 28 29 29   --   GLUCOSE 106* 87 123* 92 138*  --   BUN 9 9 12 16 15   --   CREATININE 0.42* 0.44 0.39* 0.39* 0.41* 0.42*  CALCIUM 7.8* 7.9* 8.1* 8.5* 8.7*  --   MG 2.4 2.3 2.2 2.2 2.1  --   PHOS  --   --   --   --  4.2  --    Liver Function Tests: Recent Labs  Lab 11/24/20 0701 11/25/20 0418 11/26/20 0235 11/27/20 0525 11/28/20 0624  AST 48* 35 34 24 17  ALT 34 31 28 27 26    ALKPHOS 124 107 106 91 97  BILITOT 0.6 0.4 0.6 0.7 0.6  PROT 5.9* 5.7* 5.8* 5.8* 6.3*  ALBUMIN 2.8* 2.6* 2.6* 2.6* 3.0*   No results for input(s): LIPASE, AMYLASE in the last 168 hours. No results for input(s): AMMONIA in the last 168 hours. Coagulation Profile: No results for input(s): INR, PROTIME in the last 168 hours. CBC: Recent Labs  Lab 11/24/20 0701 11/25/20 0418 11/26/20 0235 11/27/20 0525 11/28/20 0624 11/29/20 0422  WBC 8.1 7.3 5.0 5.3 8.6 9.1  NEUTROABS 6.9 5.5 2.6 3.6 4.3  --   HGB 9.0* 9.4* 9.4* 9.1* 10.3* 11.4*  HCT 29.8* 32.3* 31.6* 30.1* 34.3* 37.8  MCV 91.4 94.7 93.5 91.2 90.5 90.2  PLT 235 228 239 331 473* 619*   Cardiac Enzymes: No results for input(s): CKTOTAL, CKMB, CKMBINDEX, TROPONINI in the last 168 hours. BNP: Invalid input(s): POCBNP CBG: No results for input(s): GLUCAP in the last 168 hours. HbA1C: No results for input(s): HGBA1C in the last 72 hours. Urine analysis:    Component Value Date/Time   COLORURINE YELLOW 07/12/2017 [place] 07/12/2017 1455   LABSPEC 1.005 07/12/2017 1455   PHURINE 8.0 07/12/2017 1455   GLUCOSEU NEGATIVE 07/12/2017 1455   HGBUR NEGATIVE 07/12/2017 1455  BILIRUBINUR NEGATIVE 07/12/2017 1455   KETONESUR 5 (A) 07/12/2017 1455   PROTEINUR NEGATIVE 07/12/2017 1455   NITRITE NEGATIVE 07/12/2017 1455   LEUKOCYTESUR NEGATIVE 07/12/2017 1455   Sepsis Labs: @LABRCNTIP (procalcitonin:4,lacticidven:4) ) Recent Results (from the past 240 hour(s))  Resp Panel by RT-PCR (Flu A&B, Covid) Nasopharyngeal Swab     Status: None   Collection Time: 11/22/20 10:15 PM   Specimen: Nasopharyngeal Swab; Nasopharyngeal(NP) swabs in vial transport medium  Result Value Ref Range Status   SARS Coronavirus 2 by RT PCR NEGATIVE NEGATIVE Final    Comment: (NOTE) SARS-CoV-2 target nucleic acids are NOT DETECTED.  The SARS-CoV-2 RNA is generally detectable in upper respiratory specimens during the acute phase of  infection. The lowest concentration of SARS-CoV-2 viral copies this assay can detect is 138 copies/mL. A negative result does not preclude SARS-Cov-2 infection and should not be used as the sole basis for treatment or other patient management decisions. A negative result may occur with  improper specimen collection/handling, submission of specimen other than nasopharyngeal swab, presence of viral mutation(s) within the areas targeted by this assay, and inadequate number of viral copies(<138 copies/mL). A negative result must be combined with clinical observations, patient history, and epidemiological information. The expected result is Negative.  Fact Sheet for Patients:  BloggerCourse.comhttps://www.fda.gov/media/152166/download  Fact Sheet for Healthcare Providers:  SeriousBroker.ithttps://www.fda.gov/media/152162/download  This test is no t yet approved or cleared by the [place] FDA and  has been authorized for detection and/or diagnosis of SARS-CoV-2 by FDA under an Emergency Use Authorization (EUA). This EUA will remain  in effect (meaning this test can be used) for the duration of the COVID-19 declaration under Section 564(b)(1) of the Act, 21 U.S.C.section 360bbb-3(b)(1), unless the authorization is terminated  or revoked sooner.       Influenza A by PCR NEGATIVE NEGATIVE Final   Influenza B by PCR NEGATIVE NEGATIVE Final    Comment: (NOTE) The Xpert Xpress SARS-CoV-2/FLU/RSV plus assay is intended as an aid in the diagnosis of influenza from Nasopharyngeal swab specimens and should not be used as a sole basis for treatment. Nasal washings and aspirates are unacceptable for Xpert Xpress SARS-CoV-2/FLU/RSV testing.  Fact Sheet for Patients: BloggerCourse.comhttps://www.fda.gov/media/152166/download  Fact Sheet for Healthcare Providers: SeriousBroker.ithttps://www.fda.gov/media/152162/download  This test is not yet approved or cleared by the [place] FDA and has been authorized for detection and/or diagnosis of SARS-CoV-2  by FDA under an Emergency Use Authorization (EUA). This EUA will remain in effect (meaning this test can be used) for the duration of the COVID-19 declaration under Section 564(b)(1) of the Act, 21 U.S.C. section 360bbb-3(b)(1), unless the authorization is terminated or revoked.  Performed at [facility], [address] [place], [place] [address]   Blood Culture (routine x 2)     Status: None   Collection Time: 11/22/20 11:06 PM   Specimen: BLOOD RIGHT ARM  Result Value Ref Range Status   Specimen Description BLOOD RIGHT ARM  Final   Special Requests   Final    BOTTLES DRAWN AEROBIC AND ANAEROBIC Blood Culture adequate volume   Culture   Final    NO GROWTH 5 DAYS Performed at [facility], [address] [place], [place] [address]    Report Status 11/27/2020 FINAL  Final  Blood Culture (routine x 2)     Status: None   Collection Time: 11/22/20 11:20 PM   Specimen: Left Antecubital; Blood  Result Value Ref Range Status   Specimen Description LEFT ANTECUBITAL  Final   Special  Requests   Final    BOTTLES DRAWN AEROBIC AND ANAEROBIC Blood Culture adequate volume   Culture   Final    NO GROWTH 5 DAYS Performed at [facility], [address] [place], [place] [address]    Report Status 11/27/2020 FINAL  Final  Resp Panel by RT-PCR (Flu A&B, Covid) Nasopharyngeal Swab     Status: None   Collection Time: 11/23/20  8:54 AM   Specimen: Nasopharyngeal Swab; Nasopharyngeal(NP) swabs in vial transport medium  Result Value Ref Range Status   SARS Coronavirus 2 by RT PCR NEGATIVE NEGATIVE Final    Comment: (NOTE) SARS-CoV-2 target nucleic acids are NOT DETECTED.  The SARS-CoV-2 RNA is generally detectable in upper respiratory specimens during the acute phase of infection. The lowest concentration of SARS-CoV-2 viral copies this assay can detect is 138 copies/mL. A negative result does not preclude SARS-Cov-2 infection and should not be used as the sole basis for treatment or other  patient management decisions. A negative result may occur with  improper specimen collection/handling, submission of specimen other than nasopharyngeal swab, presence of viral mutation(s) within the areas targeted by this assay, and inadequate number of viral copies(<138 copies/mL). A negative result must be combined with clinical observations, patient history, and epidemiological information. The expected result is Negative.  Fact Sheet for Patients:  BloggerCourse.com  Fact Sheet for Healthcare Providers:  SeriousBroker.it  This test is no t yet approved or cleared by the [place] FDA and  has been authorized for detection and/or diagnosis of SARS-CoV-2 by FDA under an Emergency Use Authorization (EUA). This EUA will remain  in effect (meaning this test can be used) for the duration of the COVID-19 declaration under Section 564(b)(1) of the Act, 21 U.S.C.section 360bbb-3(b)(1), unless the authorization is terminated  or revoked sooner.       Influenza A by PCR NEGATIVE NEGATIVE Final   Influenza B by PCR NEGATIVE NEGATIVE Final    Comment: (NOTE) The Xpert Xpress SARS-CoV-2/FLU/RSV plus assay is intended as an aid in the diagnosis of influenza from Nasopharyngeal swab specimens and should not be used as a sole basis for treatment. Nasal washings and aspirates are unacceptable for Xpert Xpress SARS-CoV-2/FLU/RSV testing.  Fact Sheet for Patients: BloggerCourse.com  Fact Sheet for Healthcare Providers: SeriousBroker.it  This test is not yet approved or cleared by the [place] FDA and has been authorized for detection and/or diagnosis of SARS-CoV-2 by FDA under an Emergency Use Authorization (EUA). This EUA will remain in effect (meaning this test can be used) for the duration of the COVID-19 declaration under Section 564(b)(1) of the Act, 21 U.S.C. section  360bbb-3(b)(1), unless the authorization is terminated or revoked.  Performed at [facility], [address], [place], [place] [address]   MRSA PCR Screening     Status: None   Collection Time: 11/23/20  9:22 AM   Specimen: Nasal Mucosa; Nasopharyngeal  Result Value Ref Range Status   MRSA by PCR NEGATIVE NEGATIVE Final    Comment:        The GeneXpert MRSA Assay (FDA approved for NASAL specimens only), is one component of a comprehensive MRSA colonization surveillance program. It is not intended to diagnose MRSA infection nor to guide or monitor treatment for MRSA infections. Performed at [facility], [address] [place], [place] [address]      Scheduled Meds: . arformoterol  15 mcg Nebulization BID  . budesonide (PULMICORT) nebulizer solution  0.5 mg Nebulization BID  . busPIRone  7.5 mg Oral TID  . Chlorhexidine Gluconate Cloth  6 each Topical Daily  . citalopram  40 mg Oral Daily  . doxycycline  100 mg Oral Q12H  . enoxaparin (LOVENOX) injection  40 mg Subcutaneous Q24H  . feeding supplement  237 mL Oral BID BM  . guaiFENesin  1,200 mg Oral BID  . methylPREDNISolone (SOLU-MEDROL) injection  40 mg Intravenous Q12H  . mirtazapine  15 mg Oral QHS  . multivitamin with minerals  1 tablet Oral Daily  . nicotine  21 mg Transdermal Daily  . oxymetazoline  1 spray Each Nare BID  . pantoprazole  40 mg Oral Daily  . potassium chloride  10 mEq Oral BID  . pregabalin  200 mg Oral QHS  . revefenacin  175 mcg Nebulization Daily   Continuous Infusions: . ceFEPime (MAXIPIME) IV 2 g (11/30/20 0600)    Procedures/Studies: CT ANGIO CHEST PE W OR WO CONTRAST  Result Date: 11/23/2020 CLINICAL DATA:  Shortness of breath and left-sided chest pain with decreased oxygen saturation EXAM: CT ANGIOGRAPHY CHEST WITH CONTRAST TECHNIQUE: Multidetector CT imaging of the chest was performed using the standard protocol during bolus administration of intravenous contrast. Multiplanar CT image  reconstructions and MIPs were obtained to evaluate the vascular anatomy. CONTRAST:  83mL OMNIPAQUE IOHEXOL 350 MG/ML SOLN COMPARISON:  Chest x-ray from the previous day. FINDINGS: Cardiovascular: Thoracic aorta shows minimal atherosclerotic calcifications. No aneurysmal dilatation or dissection is noted. No cardiac enlargement is seen. The coronary arteries demonstrate mild calcification. Pulmonary artery is well visualized within normal branching pattern. No findings to suggest pulmonary emboli are noted. Mediastinum/Nodes: Thoracic inlet is within normal limits. Scattered hilar and subcarinal adenopathy is noted likely reactive in nature. The esophagus is within normal limits. Lungs/Pleura: Consolidation is noted in the right lower lobe as well as scattered predominately peripheral airspace opacities within both lungs. No sizable effusion is noted. No sizable parenchymal nodules are noted. Upper Abdomen: No acute abnormality noted. Musculoskeletal: No acute bony abnormality is noted Review of the MIP images confirms the above findings. IMPRESSION: Right lower lobe consolidation with patchy ground-glass airspace opacities bilaterally consistent with multifocal pneumonia. Correlate with COVID-19 testing. No evidence of pulmonary emboli. Aortic Atherosclerosis (ICD10-I70.0). Electronically Signed   By: Alcide Clever M.D.   On: 11/23/2020 09:09   DG Chest Port 1 View  Result Date: 11/27/2020 CLINICAL DATA:  Hypoxemia.  Acute respiratory failure. EXAM: PORTABLE CHEST 1 VIEW COMPARISON:  One-view chest x-ray 11/25/2020 FINDINGS: Heart size is normal. Aeration at the lung bases is improving. Interstitial coarsening is again noted. IMPRESSION: Improving aeration at the lung bases bilaterally. Electronically Signed   By: Marin Roberts M.D.   On: 11/27/2020 16:03   DG CHEST PORT 1 VIEW  Result Date: 11/25/2020 CLINICAL DATA:  Shortness of breath.  History of COPD. EXAM: PORTABLE CHEST 1 VIEW COMPARISON:   Single-view of the chest 11/22/2020. PA and lateral chest 08/30/2018. CT chest 11/23/2020. FINDINGS: The lungs are emphysematous. Patchy bilateral airspace disease most compatible with pneumonia has progressed with new opacities seen in the upper lung zones bilaterally. No pneumothorax or pleural fluid. Heart size is normal. Aortic atherosclerosis. IMPRESSION: Patchy bilateral airspace disease compatible with plain film pneumonia has worsened since the most recent plain film comparison. Aortic Atherosclerosis (ICD10-I70.0) and Emphysema (ICD10-J43.9). Electronically Signed   By: Drusilla Kanner M.D.   On: 11/25/2020 10:23   DG Chest Portable 1 View  Result Date: 11/22/2020 CLINICAL DATA:  Shortness of breath, COPD. EXAM: PORTABLE  CHEST 1 VIEW COMPARISON:  Chest x-ray 11/12/2019, CT chest 02/07/2020 FINDINGS: The heart size and mediastinal contours are within normal limits. Hyperinflation of the lungs consistent with emphysematous changes. Interval increase in interstitial markings diffusely but more prominent within the lower lobes. Hazy patchy bilateral lower lobe hazy airspace opacities. No pleural effusion. No pneumothorax. No acute osseous abnormality. IMPRESSION: Increased interstitial markings and hazy airspace opacities more prominent within the lower lobes. Findings likely represent infection/inflammation. COVID-19 infection not excluded. Followup PA and lateral chest X-ray is recommended in 3-4 weeks following therapy to ensure resolution and exclude underlying malignancy. Electronically Signed   By: Tish Frederickson M.D.   On: 11/22/2020 21:40   Santonio Speakman,MD  [facility]  If 7PM-7AM, please contact night-coverage [email or web address] Password [email or web address] 11/30/2020, 12:05 PM   LOS: 7 days

## 2020-12-01 ENCOUNTER — Other Ambulatory Visit: Payer: Self-pay

## 2020-12-01 DIAGNOSIS — J441 Chronic obstructive pulmonary disease with (acute) exacerbation: Secondary | ICD-10-CM | POA: Diagnosis not present

## 2020-12-01 DIAGNOSIS — J9601 Acute respiratory failure with hypoxia: Secondary | ICD-10-CM | POA: Diagnosis not present

## 2020-12-01 DIAGNOSIS — J9621 Acute and chronic respiratory failure with hypoxia: Secondary | ICD-10-CM | POA: Diagnosis not present

## 2020-12-01 DIAGNOSIS — J189 Pneumonia, unspecified organism: Secondary | ICD-10-CM | POA: Diagnosis not present

## 2020-12-01 LAB — BASIC METABOLIC PANEL
Anion gap: 12 (ref 5–15)
BUN: 15 mg/dL (ref 6–20)
CO2: 26 mmol/L (ref 22–32)
Calcium: 8.7 mg/dL — ABNORMAL LOW (ref 8.9–10.3)
Chloride: 101 mmol/L (ref 98–111)
Creatinine, Ser: 0.48 mg/dL (ref 0.44–1.00)
GFR, Estimated: >60 mL/min (ref >60)
Glucose, Bld: 133 mg/dL — ABNORMAL HIGH (ref 70–99)
Potassium: 3.7 mmol/L (ref 3.5–5.1)
Sodium: 139 mmol/L (ref 135–145)

## 2020-12-01 LAB — CBC
HCT: 42.6 % (ref 36.0–46.0)
Hemoglobin: 12.7 g/dL (ref 12.0–15.0)
MCH: 27.9 pg (ref 26.0–34.0)
MCHC: 29.8 g/dL — ABNORMAL LOW (ref 30.0–36.0)
MCV: 93.6 fL (ref 80.0–100.0)
Platelets: 607 10*3/uL — ABNORMAL HIGH (ref 150–400)
RBC: 4.55 MIL/uL (ref 3.87–5.11)
RDW: 17.5 % — ABNORMAL HIGH (ref 11.5–15.5)
WBC: 15.4 10*3/uL — ABNORMAL HIGH (ref 4.0–10.5)
nRBC: 0 % (ref 0.0–0.2)

## 2020-12-01 MED ORDER — VALACYCLOVIR 50 MG/ML ORAL SUSPENSION
2000.0000 mg | Freq: Two times a day (BID) | ORAL | Status: DC
  Filled 2020-12-01 (×2): qty 40

## 2020-12-01 MED ORDER — VALACYCLOVIR 50 MG/ML ORAL SUSPENSION: 500.0000 mg | Freq: Three times a day (TID) | ORAL | Status: DC

## 2020-12-01 NOTE — Progress Notes (Signed)
PROGRESS NOTE  Debra [name] GNF:[id number] DOB: 04-12-1966 DOA: 11/22/2020 PCP: Vidal Schwalbe, MD   Brief History: 55 y.o.femalewith medical history significant ofanxiety, panic attacks, depression, osteoarthritis, chronic lower back pain, COPD/active smoker, GERD, history of hiatal hernia, history of bleeding ulcers, hypokalemia, history of pneumonia who is coming to the emergency department with complaints of fever and progressively worse dyspnea for the past 3 days.All other associated symptoms are occasionally productive of whitish sputum cough, fatigue, malaise, wheezing and headache. No travel history or sick contacts. No significant relief by medications at home. She has doubledher O2 requirement from 3 LPM to 6 LPM with some improvement. She mentions that she is supposed to use her oxygen at night, but for the past 3 days she has been using it during daytime as well.  ED Course:Initial vital signs were temperature101.5 F, pulse 118, respirations 22, BP 104/86 mmHg and O2 sat 87% on 6 LPM via nasal cannula. The patient is currently on 10 LPM via HFNC. He received acetaminophen at 1000 mg p.o. x1, a 1000 mL NS bolus, 8 mg of dexamethasone IVP x1 and doxycycline 100 mg IVPB every 12 hours was started.  Labwork: Coronavirus 2 and influenza PCR was negative. CBC showed a white count of 9.4 with 85% neutrophils, hemoglobin 10.0 g/dL and platelets 243. Lactic acid was normal. CMP showed normal sodium, chloride and CO2, but potassium level was 2.9 mmol/L. Glucose 121 and calcium 7.5 mg/dL. Renal function was normal. Total protein 6.3, albumin 3.0 g/dL. Alkaline phosphatase 130 units/L. AST, ALT and total bilirubin were normal.  Assessment/Plan: Acute on chronic Respiratory Failure with Hypoxia -due to pneumonia and COPD exacerbation -chronically on 3 to 4L at home PTA -12/26 CTA chest--no PE; RLL consolidation with bilateral patchy GGO -wean oxygen for  saturation >90% -pulmonary consult appreciated-- Dr. Vashti Hey hypnotic medications if possible -may need prn BiPAP with precedex -12/30 personally reviewed CXR--improving opacities, no edema -Overall respiratory status improving slowly, oxygen requirement down to 6 L via nasal cannula from 11 L this admission -Plan for repeat chest x-ray on 12/01/2019----showed improved aeration and resolving infiltrates -Continue Solu-Medrol IV  Lobar Pnuemonia --Completed cefepime and doxy--on 12/01/2020 for a total of 9 days -Repeat chest x-ray on 11/30/2020--as noted above  COPD exacerbation -continue duonebs>>revefenacin nebs per pulm -continue pulmicort -added brovana --ContinueIV solumedrol -continue mucinex -IS, flutter valve -Oxygen requirement improved  Anxiety ---Remains Very anxious with panic attack episodes -continue home dose klonopin 5 mg q 6 hrs prn anxiety -continue celexa, added BuSpar 5 mg 3 times daily -Also added Remeron 15 mg nightly as patient has anorexia along with anxiety  Chronic pain syndrome -continue home dose percocet 7.5/325 and pregabalin -Patient is on benzos, avoid over aggressive escalation of opiates and benzo to avoid respiratory depression  Hypokalemia -replete -check mag--2.1  Hyperlipidemia -noncompliant with statin  Tobacco abuse -cessation discussed -nicoderm patch  --Generalized weakness and deconditioning----get PT eval  Status is: Inpatient  Remains inpatient appropriate because: Continues to have significant shortness of breath, oxygen requirement much higher than baseline, requiring IV steroids and IV antibiotics Dispo: The patient is from:Home Anticipated d/c is QI:[facility] Vs SNF Anticipated d/c date is:3 days Patient currently Not medically   Family Communication:mother previously updated /called and updated patient's son Einar Pheasant--- [phone]    Consultants:PCCM--  Code Status: FULL   DVT Prophylaxis:  [place] Lovenox   Procedures: As Listed in Progress Note Above  Antibiotics: Cefepime 12/26>> 12/01/20 Doxy 12/26>>12/01/20  Subjective: - -Slept better last night with Remeron -Shortness of breath is better, -No productive cough, no fevers -Becomes dyspneic with minimal activity however  Objective: Vitals:   12/01/20 0801 12/01/20 0900 12/01/20 0932 12/01/20 1000  BP:  (!) 128/59  (!) 143/71  Pulse:  91  83  Resp:  13  15  Temp:      TempSrc:      SpO2: 97% 91% 93% 93%  Weight:      Height:        Intake/Output Summary (Last 24 hours) at 12/01/2020 1044 Last data filed at 12/01/2020 0758 Gross per 24 hour  Intake 600.13 ml  Output 300 ml  Net 300.13 ml   Weight change:  Exam:   General:  Pt is alert, follows commands appropriately, +ve DOE   HEENT: No icterus, No thrush, No neck mass, [place]/AT  Nose- 6L/min  Cardiovascular: RRR, S1/S2, no rubs, no gallops  Respiratory: Fair air movement bilaterally, few scattered rhonchi,  abdomen: Soft/+BS, non tender, non distended, no guarding  Extremities: No edema, No lymphangitis, No petechiae, No rashes, no synovitis  Psych--very, anxious, alert oriented x3   Data Reviewed: I have personally reviewed following labs and imaging studies Basic Metabolic Panel: Recent Labs  Lab 11/25/20 0418 11/26/20 0235 11/27/20 0525 11/28/20 0624 11/29/20 0422 11/30/20 0507 12/01/20 0254  NA 141 140 141 140 139  --  139  K 3.8 3.5 4.3 3.4* 3.9  --  3.7  CL 103 102 101 101 99  --  101  CO2 28 29 28 29 29   --  26  GLUCOSE 106* 87 123* 92 138*  --  133*  BUN 9 9 12 16 15   --  15  CREATININE 0.42* 0.44 0.39* 0.39* 0.41* 0.42* 0.48  CALCIUM 7.8* 7.9* 8.1* 8.5* 8.7*  --  8.7*  MG 2.4 2.3 2.2 2.2 2.1  --   --   PHOS  --   --   --   --  4.2  --   --    Liver Function Tests: Recent Labs  Lab 11/25/20 0418 11/26/20 0235 11/27/20 0525 11/28/20 0624  AST  35 34 24 17  ALT 31 28 27 26   ALKPHOS 107 106 91 97  BILITOT 0.4 0.6 0.7 0.6  PROT 5.7* 5.8* 5.8* 6.3*  ALBUMIN 2.6* 2.6* 2.6* 3.0*   No results for input(s): LIPASE, AMYLASE in the last 168 hours. No results for input(s): AMMONIA in the last 168 hours. Coagulation Profile: No results for input(s): INR, PROTIME in the last 168 hours. CBC: Recent Labs  Lab 11/25/20 0418 11/26/20 0235 11/27/20 0525 11/28/20 0624 11/29/20 0422 12/01/20 0254  WBC 7.3 5.0 5.3 8.6 9.1 15.4*  NEUTROABS 5.5 2.6 3.6 4.3  --   --   HGB 9.4* 9.4* 9.1* 10.3* 11.4* 12.7  HCT 32.3* 31.6* 30.1* 34.3* 37.8 42.6  MCV 94.7 93.5 91.2 90.5 90.2 93.6  PLT 228 239 331 473* 619* 607*   Cardiac Enzymes: No results for input(s): CKTOTAL, CKMB, CKMBINDEX, TROPONINI in the last 168 hours. BNP: Invalid input(s): POCBNP CBG: No results for input(s): GLUCAP in the last 168 hours. HbA1C: No results for input(s): HGBA1C in the last 72 hours. Urine analysis:    Component Value Date/Time   COLORURINE YELLOW 07/12/2017 1455   APPEARANCEUR CLEAR 07/12/2017 1455   LABSPEC 1.005 07/12/2017 1455   PHURINE 8.0 07/12/2017 1455   GLUCOSEU NEGATIVE 07/12/2017 1455   HGBUR NEGATIVE 07/12/2017 1455   BILIRUBINUR NEGATIVE  07/12/2017 1455   KETONESUR 5 (A) 07/12/2017 1455   PROTEINUR NEGATIVE 07/12/2017 1455   NITRITE NEGATIVE 07/12/2017 1455   LEUKOCYTESUR NEGATIVE 07/12/2017 1455   Sepsis Labs: @LABRCNTIP (procalcitonin:4,lacticidven:4) ) Recent Results (from the past 240 hour(s))  Resp Panel by RT-PCR (Flu A&B, Covid) Nasopharyngeal Swab     Status: None   Collection Time: 11/22/20 10:15 PM   Specimen: Nasopharyngeal Swab; Nasopharyngeal(NP) swabs in vial transport medium  Result Value Ref Range Status   SARS Coronavirus 2 by RT PCR NEGATIVE NEGATIVE Final    Comment: (NOTE) SARS-CoV-2 target nucleic acids are NOT DETECTED.  The SARS-CoV-2 RNA is generally detectable in upper respiratory specimens during the acute  phase of infection. The lowest concentration of SARS-CoV-2 viral copies this assay can detect is 138 copies/mL. A negative result does not preclude SARS-Cov-2 infection and should not be used as the sole basis for treatment or other patient management decisions. A negative result may occur with  improper specimen collection/handling, submission of specimen other than nasopharyngeal swab, presence of viral mutation(s) within the areas targeted by this assay, and inadequate number of viral copies(<138 copies/mL). A negative result must be combined with clinical observations, patient history, and epidemiological information. The expected result is Negative.  Fact Sheet for Patients:  11/24/20  Fact Sheet for Healthcare Providers:  BloggerCourse.com  This test is no t yet approved or cleared by the [place] FDA and  has been authorized for detection and/or diagnosis of SARS-CoV-2 by FDA under an Emergency Use Authorization (EUA). This EUA will remain  in effect (meaning this test can be used) for the duration of the COVID-19 declaration under Section 564(b)(1) of the Act, 21 U.S.C.section 360bbb-3(b)(1), unless the authorization is terminated  or revoked sooner.       Influenza A by PCR NEGATIVE NEGATIVE Final   Influenza B by PCR NEGATIVE NEGATIVE Final    Comment: (NOTE) The Xpert Xpress SARS-CoV-2/FLU/RSV plus assay is intended as an aid in the diagnosis of influenza from Nasopharyngeal swab specimens and should not be used as a sole basis for treatment. Nasal washings and aspirates are unacceptable for Xpert Xpress SARS-CoV-2/FLU/RSV testing.  Fact Sheet for Patients: [place]  Fact Sheet for Healthcare Providers: BloggerCourse.com  This test is not yet approved or cleared by the [employer] FDA and has been authorized for detection and/or diagnosis of  SARS-CoV-2 by FDA under an Emergency Use Authorization (EUA). This EUA will remain in effect (meaning this test can be used) for the duration of the COVID-19 declaration under Section 564(b)(1) of the Act, 21 U.S.C. section 360bbb-3(b)(1), unless the authorization is terminated or revoked.  Performed at [facility], [address], [place], [place]   Blood Culture (routine x 2)     Status: None   Collection Time: 11/22/20 11:06 PM   Specimen: BLOOD RIGHT ARM  Result Value Ref Range Status   Specimen Description BLOOD RIGHT ARM  Final   Special Requests   Final    BOTTLES DRAWN AEROBIC AND ANAEROBIC Blood Culture adequate volume   Culture   Final    NO GROWTH 5 DAYS Performed at [facility], [address] [place], [place]    Report Status 11/27/2020 FINAL  Final  Blood Culture (routine x 2)     Status: None   Collection Time: 11/22/20 11:20 PM   Specimen: Left Antecubital; Blood  Result Value Ref Range Status   Specimen Description LEFT ANTECUBITAL  Final   Special Requests  Final    BOTTLES DRAWN AEROBIC AND ANAEROBIC Blood Culture adequate volume   Culture   Final    NO GROWTH 5 DAYS Performed at [facility], [address] [place], [place] [address]    Report Status 11/27/2020 FINAL  Final  Resp Panel by RT-PCR (Flu A&B, Covid) Nasopharyngeal Swab     Status: None   Collection Time: 11/23/20  8:54 AM   Specimen: Nasopharyngeal Swab; Nasopharyngeal(NP) swabs in vial transport medium  Result Value Ref Range Status   SARS Coronavirus 2 by RT PCR NEGATIVE NEGATIVE Final    Comment: (NOTE) SARS-CoV-2 target nucleic acids are NOT DETECTED.  The SARS-CoV-2 RNA is generally detectable in upper respiratory specimens during the acute phase of infection. The lowest concentration of SARS-CoV-2 viral copies this assay can detect is 138 copies/mL. A negative result does not preclude SARS-Cov-2 infection and should not be used as the sole basis for treatment  or other patient management decisions. A negative result may occur with  improper specimen collection/handling, submission of specimen other than nasopharyngeal swab, presence of viral mutation(s) within the areas targeted by this assay, and inadequate number of viral copies(<138 copies/mL). A negative result must be combined with clinical observations, patient history, and epidemiological information. The expected result is Negative.  Fact Sheet for Patients:  BloggerCourse.com  Fact Sheet for Healthcare Providers:  SeriousBroker.it  This test is no t yet approved or cleared by the [place] FDA and  has been authorized for detection and/or diagnosis of SARS-CoV-2 by FDA under an Emergency Use Authorization (EUA). This EUA will remain  in effect (meaning this test can be used) for the duration of the COVID-19 declaration under Section 564(b)(1) of the Act, 21 U.S.C.section 360bbb-3(b)(1), unless the authorization is terminated  or revoked sooner.       Influenza A by PCR NEGATIVE NEGATIVE Final   Influenza B by PCR NEGATIVE NEGATIVE Final    Comment: (NOTE) The Xpert Xpress SARS-CoV-2/FLU/RSV plus assay is intended as an aid in the diagnosis of influenza from Nasopharyngeal swab specimens and should not be used as a sole basis for treatment. Nasal washings and aspirates are unacceptable for Xpert Xpress SARS-CoV-2/FLU/RSV testing.  Fact Sheet for Patients: BloggerCourse.com  Fact Sheet for Healthcare Providers: SeriousBroker.it  This test is not yet approved or cleared by the [place] FDA and has been authorized for detection and/or diagnosis of SARS-CoV-2 by FDA under an Emergency Use Authorization (EUA). This EUA will remain in effect (meaning this test can be used) for the duration of the COVID-19 declaration under Section 564(b)(1) of the Act, 21 U.S.C. section  360bbb-3(b)(1), unless the authorization is terminated or revoked.  Performed at [facility], [address] [place], [place] [address]   MRSA PCR Screening     Status: None   Collection Time: 11/23/20  9:22 AM   Specimen: Nasal Mucosa; Nasopharyngeal  Result Value Ref Range Status   MRSA by PCR NEGATIVE NEGATIVE Final    Comment:        The GeneXpert MRSA Assay (FDA approved for NASAL specimens only), is one component of a comprehensive MRSA colonization surveillance program. It is not intended to diagnose MRSA infection nor to guide or monitor treatment for MRSA infections. Performed at [facility], [address], [place], [place] [address]      Scheduled Meds: . arformoterol  15 mcg Nebulization BID  . budesonide (PULMICORT) nebulizer solution  0.5 mg Nebulization BID  . busPIRone  7.5 mg Oral  TID  . Chlorhexidine Gluconate Cloth  6 each Topical Daily  . citalopram  40 mg Oral Daily  . doxycycline  100 mg Oral Q12H  . enoxaparin (LOVENOX) injection  40 mg Subcutaneous Q24H  . feeding supplement  237 mL Oral BID BM  . guaiFENesin  1,200 mg Oral BID  . methylPREDNISolone (SOLU-MEDROL) injection  40 mg Intravenous Q12H  . mirtazapine  15 mg Oral QHS  . multivitamin with minerals  1 tablet Oral Daily  . nicotine  21 mg Transdermal Daily  . oxymetazoline  1 spray Each Nare BID  . pantoprazole  40 mg Oral Daily  . potassium chloride  10 mEq Oral BID  . pregabalin  200 mg Oral QHS  . revefenacin  175 mcg Nebulization Daily   Continuous Infusions:   Procedures/Studies: DG Chest 2 View  Result Date: 11/30/2020 CLINICAL DATA:  Worsening shortness of breath. EXAM: CHEST - 2 VIEW COMPARISON:  Chest x-ray 11/27/2020 FINDINGS: The cardiac silhouette, mediastinal and hilar contours are within normal limits. The lungs demonstrate stable underlying emphysematous changes. Improved lung aeration with resolving interstitial infiltrates. No pleural effusions or pneumothorax. The bony  thorax is intact. IMPRESSION: Stable underlying emphysematous changes. Improved lung aeration with resolving infiltrates. Electronically Signed   By: Rudie Meyer M.D.   On: 11/30/2020 12:30   CT ANGIO CHEST PE W OR WO CONTRAST  Result Date: 11/23/2020 CLINICAL DATA:  Shortness of breath and left-sided chest pain with decreased oxygen saturation EXAM: CT ANGIOGRAPHY CHEST WITH CONTRAST TECHNIQUE: Multidetector CT imaging of the chest was performed using the standard protocol during bolus administration of intravenous contrast. Multiplanar CT image reconstructions and MIPs were obtained to evaluate the vascular anatomy. CONTRAST:  61mL OMNIPAQUE IOHEXOL 350 MG/ML SOLN COMPARISON:  Chest x-ray from the previous day. FINDINGS: Cardiovascular: Thoracic aorta shows minimal atherosclerotic calcifications. No aneurysmal dilatation or dissection is noted. No cardiac enlargement is seen. The coronary arteries demonstrate mild calcification. Pulmonary artery is well visualized within normal branching pattern. No findings to suggest pulmonary emboli are noted. Mediastinum/Nodes: Thoracic inlet is within normal limits. Scattered hilar and subcarinal adenopathy is noted likely reactive in nature. The esophagus is within normal limits. Lungs/Pleura: Consolidation is noted in the right lower lobe as well as scattered predominately peripheral airspace opacities within both lungs. No sizable effusion is noted. No sizable parenchymal nodules are noted. Upper Abdomen: No acute abnormality noted. Musculoskeletal: No acute bony abnormality is noted Review of the MIP images confirms the above findings. IMPRESSION: Right lower lobe consolidation with patchy ground-glass airspace opacities bilaterally consistent with multifocal pneumonia. Correlate with COVID-19 testing. No evidence of pulmonary emboli. Aortic Atherosclerosis (ICD10-I70.0). Electronically Signed   By: Alcide Clever M.D.   On: 11/23/2020 09:09   DG Chest Port 1  View  Result Date: 11/27/2020 CLINICAL DATA:  Hypoxemia.  Acute respiratory failure. EXAM: PORTABLE CHEST 1 VIEW COMPARISON:  One-view chest x-ray 11/25/2020 FINDINGS: Heart size is normal. Aeration at the lung bases is improving. Interstitial coarsening is again noted. IMPRESSION: Improving aeration at the lung bases bilaterally. Electronically Signed   By: Marin Roberts M.D.   On: 11/27/2020 16:03   DG CHEST PORT 1 VIEW  Result Date: 11/25/2020 CLINICAL DATA:  Shortness of breath.  History of COPD. EXAM: PORTABLE CHEST 1 VIEW COMPARISON:  Single-view of the chest 11/22/2020. PA and lateral chest 08/30/2018. CT chest 11/23/2020. FINDINGS: The lungs are emphysematous. Patchy bilateral airspace disease most compatible with pneumonia has progressed with new opacities seen  in the upper lung zones bilaterally. No pneumothorax or pleural fluid. Heart size is normal. Aortic atherosclerosis. IMPRESSION: Patchy bilateral airspace disease compatible with plain film pneumonia has worsened since the most recent plain film comparison. Aortic Atherosclerosis (ICD10-I70.0) and Emphysema (ICD10-J43.9). Electronically Signed   By: Drusilla Kannerhomas  Dalessio M.D.   On: 11/25/2020 10:23   DG Chest Portable 1 View  Result Date: 11/22/2020 CLINICAL DATA:  Shortness of breath, COPD. EXAM: PORTABLE CHEST 1 VIEW COMPARISON:  Chest x-ray 11/12/2019, CT chest 02/07/2020 FINDINGS: The heart size and mediastinal contours are within normal limits. Hyperinflation of the lungs consistent with emphysematous changes. Interval increase in interstitial markings diffusely but more prominent within the lower lobes. Hazy patchy bilateral lower lobe hazy airspace opacities. No pleural effusion. No pneumothorax. No acute osseous abnormality. IMPRESSION: Increased interstitial markings and hazy airspace opacities more prominent within the lower lobes. Findings likely represent infection/inflammation. COVID-19 infection not excluded. Followup PA  and lateral chest X-ray is recommended in 3-4 weeks following therapy to ensure resolution and exclude underlying malignancy. Electronically Signed   By: Tish FredericksonMorgane  Naveau M.D.   On: 11/22/2020 21:40   Ishmeal Rorie,MD  [facility]  If 7PM-7AM, please contact night-coverage [email or web address] Password [email or web address] 12/01/2020, 10:44 AM   LOS: 8 days

## 2020-12-01 NOTE — Progress Notes (Signed)
Pt's son [name] called stating he would like to come get patient, sign out AMA so that he can take her to [facility] ER. He has requested an oxygen tank to get her there but advised him that if patient is signing out AMA that we cannot provide him with oxygen - that he would have to bring patient's O2 from home to use. He requested to speak to Dr. Marisa Severin - Dr. Marisa Severin attempted to return call x2, I attempted as well - no answer and no voicemail set up to leave message. Spoke to patient who at this time states she does not want to leave AMA because she "can't sit in [facility] ER all day waiting to get admitted". She again requests that we attempt to find a physician there that will accept her so that she can transfer. Her reason for request is she "likes the Drs and nursing staff better there and they will give her her pain medicine like they're supposed to".

## 2020-12-01 NOTE — Progress Notes (Signed)
NAME:  Debra [name], MRN:  [id number], DOB:  [birth year], LOS: 8 ADMISSION DATE:  11/22/2020, CONSULTATION DATE:  12/30 REFERRING MD:  Tat/triad, CHIEF COMPLAINT:  resp distress    Brief History:  55 yo female smoker presented with fever (Temp 101.3F) and dyspnea x 3 days.  Associated with cough, fatigue, wheezing and headache.  Started on treatment for pneumonia and COPD exacerbation.  Slow to improve, and PCCM asked to assist with respiratory management.  Past Medical History:  Anxiety, HH, PUD, GERD, Depression, COPD, Low back pain, Arthritis   Significant Hospital Events:  12/25 Admit  Consults:    Procedures:    Significant Diagnostic Tests:   PFT 08/30/18 >> FEV1 1.47 (49%), FEV1% 49, TLC 7.08 (132%), DLCO 42%  CT angio chest 11/23/20 >> RLL consolidation with patchy GGO b/l  Micro Data:  COVID/flu 12/25 >> negative Blood 12/25 >> negative  MRSA PCR  12/26 > negative COVID/flu 12/16 >> negative Pneumococcal Ag 12/26 >> negative Legionella Ag 12/26 >> negative  Antimicrobials:  Doxycycline 12/25 >> Cefepime 12/26 >> 1/02  Interim History / Subjective:  Still has cough and chest congestion.  Has trouble bringing up phlegm.  Feels weak and doesn't feel like she has the energy to get out of bed.  Objective   Blood pressure 110/61, pulse 74, temperature 97.9 F (36.6 C), temperature source Oral, resp. rate 16, height 5\' 7"  (1.702 m), weight 75 kg, SpO2 97 %.        Intake/Output Summary (Last 24 hours) at 12/01/2020 1444 Last data filed at 12/01/2020 1100 Gross per 24 hour  Intake 479.35 ml  Output 300 ml  Net 179.35 ml   Filed Weights   11/22/20 2137 11/24/20 1820 11/25/20 0500  Weight: 72.1 kg 75 kg 75 kg    Examination:  General - alert Eyes - pupils reactive ENT - no sinus tenderness, no stridor Cardiac - regular rate/rhythm, no murmur Chest - decreased breath sounds, no wheeze Abdomen - soft, non tender, + bowel sounds Extremities - no cyanosis,  clubbing, or edema Skin - no rashes Neuro - normal strength, moves extremities, follows commands Psych - normal mood and behavior   Resolved Hospital Problem list      Assessment & Plan:   Community acquired pneumonia. - ABx per primary team - f/u CXR intermittently  Acute on chronic hypoxic respiratory failure. - goal SpO2 90 to 95%  COPD exacerbation. - continue brovana, pulmicort, yupelri - prn albuterol - wean off solumedrol as tolerated  Tobacco abuse. - nicotine patch  Thrombocytosis. - f/u CBC - if continues to rise, then might need hematology assessment  Anxiety, chronic pain. - per primary team  Deconditioning. - encouraged her to participate with physical therapy   Best practice:  Diet: heart healthy DVT prophylaxis: lovenox GI prophylaxis: protonix Mobility: as tolerated Goals of care: full code; wouldn't want long term vent support Disposition:ICU   Labs    CMP Latest Ref Rng & Units 12/01/2020 11/30/2020 11/29/2020  Glucose 70 - 99 mg/dL 01/27/2021) - 413(K)  BUN 6 - 20 mg/dL 15 - 15  Creatinine 440(N - 1.00 mg/dL 0.27 2.53) 6.64(Q)  Sodium 135 - 145 mmol/L 139 - 139  Potassium 3.5 - 5.1 mmol/L 3.7 - 3.9  Chloride 98 - 111 mmol/L 101 - 99  CO2 22 - 32 mmol/L 26 - 29  Calcium 8.9 - 10.3 mg/dL 0.34(V) - 8.7(L)  Total Protein 6.5 - 8.1 g/dL - - -  Total Bilirubin 0.3 -  1.2 mg/dL - - -  Alkaline Phos 38 - 126 U/L - - -  AST 15 - 41 U/L - - -  ALT 0 - 44 U/L - - -    CBC Latest Ref Rng & Units 12/01/2020 11/29/2020 11/28/2020  WBC 4.0 - 10.5 K/uL 15.4(H) 9.1 8.6  Hemoglobin 12.0 - 15.0 g/dL 93.7 11.4(L) 10.3(L)  Hematocrit 36.0 - 46.0 % 42.6 37.8 34.3(L)  Platelets 150 - 400 K/uL 607(H) 619(H) 473(H)    ABG    Component Value Date/Time   PHART 7.501 (H) 11/27/2020 1610   PCO2ART 35.8 11/27/2020 1610   PO2ART 74.3 (L) 11/27/2020 1610   HCO3 28.7 (H) 11/27/2020 1610   TCO2 30 07/25/2020 1525   O2SAT 96.1 11/27/2020 1610   Signature:

## 2020-12-01 NOTE — Progress Notes (Signed)
eLink Physician-Brief Progress Note Patient Name: Debra [name] DOB: 09-Sep-1966 MRN: [id number]   Date of Service  12/01/2020  HPI/Events of Note  Patient with flare up of her known herpes Simplex blistering.  eICU Interventions  Valtrex ordered.        Thomasene Lot China Deitrick 12/01/2020, 10:01 PM

## 2020-12-01 NOTE — Progress Notes (Signed)
Physical Therapy Treatment Patient Details Name: Debra [name] MRN: [id number] DOB: November 08, 1966 Today's Date: 12/01/2020    History of Present Illness Nikiyah Fackler is a 55 y.o. female with medical history significant of anxiety, panic attacks, depression, osteoarthritis, chronic lower back pain, COPD/active smoker, GERD, history of hiatal hernia, history of bleeding ulcers, hypokalemia, history of pneumonia who is coming to the emergency department with complaints of fever and progressively worse dyspnea for the past 3 days.  All other associated symptoms are occasionally productive of whitish sputum cough, fatigue, malaise, wheezing and headache.  No travel history or sick contacts.  No significant relief by medications at home.  She has doubled her O2 requirement from 3 LPM to 6 LPM with some improvement.  She mentions that she is supposed to use her oxygen at night, but for the past 3 days she has been using it during daytime as well.  She denies hemoptysis, chest pain, palpitations, dizziness, diaphoresis, PND, orthopnea or recent pitting edema of the lower extremities.  She denies abdominal pain, nausea, vomiting, diarrhea, constipation, melena or hematochezia.  No dysuria, frequency or hematuria.  No polyuria, polydipsia, polyphagia or blurred vision.    PT Comments    Patient requires frequent rest breaks while completing BLE exercises seated at bedside, demonstrates slow labored cadence during ambulation having to use RW due to frequent leaning on nearby objects for support, required sitting rest break for up to 6-7 minutes during gait training before walking around bed to sit in chair and tolerated staying up in chair after therapy.  Patient on 4 LPM with SpO2 between 86% with exertion to 91% at rest during treatment.  Patient will benefit from continued physical therapy in hospital and recommended venue below to increase strength, balance, endurance for safe ADLs and gait.   Follow Up  Recommendations  Home health PT;Supervision - Intermittent     Equipment Recommendations  None recommended by PT    Recommendations for Other Services       Precautions / Restrictions Precautions Precautions: Fall Restrictions Weight Bearing Restrictions: No    Mobility  Bed Mobility Overal bed mobility: Modified Independent                Transfers Overall transfer level: Needs assistance Equipment used: Rolling walker (2 wheeled) Transfers: Sit to/from [employer] Transfers Sit to Stand: Supervision;Min guard Stand pivot transfers: Supervision;Min guard          Ambulation/Gait Ambulation/Gait assistance: Min guard;Min assist Gait Distance (Feet): 15 Feet Assistive device: Rolling walker (2 wheeled) Gait Pattern/deviations: Decreased step length - left;Decreased stance time - right;Decreased stride length Gait velocity: decreased   General Gait Details: slow labored unsteady cadence without loss of balance, limited mostly due to c/o fatigue and SOB with SpO2 at 86-88% had to take sitting rest break before walking back to chair   Stairs             Wheelchair Mobility    Modified Rankin (Stroke Patients Only)       Balance Overall balance assessment: Needs assistance Sitting-balance support: Feet supported;No upper extremity supported Sitting balance-Leahy Scale: Good Sitting balance - Comments: seated at EOB   Standing balance support: During functional activity;Bilateral upper extremity supported Standing balance-Leahy Scale: Fair Standing balance comment: using RW                            Cognition Arousal/Alertness: Awake/alert Behavior During Therapy: WFL for tasks assessed/performed Overall  Cognitive Status: Within Functional Limits for tasks assessed                                        Exercises General Exercises - Lower Extremity Long Arc Quad: Seated;AROM;Strengthening;Both;10 reps Hip  Flexion/Marching: Seated;AROM;Strengthening;Both;10 reps Toe Raises: Seated;AROM;Strengthening;Both;20 reps Heel Raises: Seated;AROM;Strengthening;Both;20 reps    General Comments        Pertinent Vitals/Pain Pain Assessment: Faces Faces Pain Scale: Hurts a little bit Pain Location: generalized pain all over Pain Descriptors / Indicators: Discomfort Pain Intervention(s): Limited activity within patient's tolerance;Monitored during session;RN gave pain meds during session    Home Living                      Prior Function            PT Goals (current goals can now be found in the care plan section) Acute Rehab PT Goals Patient Stated Goal: return home PT Goal Formulation: With patient Time For Goal Achievement: 12/05/20 Potential to Achieve Goals: Good Progress towards PT goals: Progressing toward goals    Frequency    Min 3X/week      PT Plan Current plan remains appropriate    Co-evaluation              AM-PAC PT "6 Clicks" Mobility   Outcome Measure  Help needed turning from your back to your side while in a flat bed without using bedrails?: None Help needed moving from lying on your back to sitting on the side of a flat bed without using bedrails?: None Help needed moving to and from a bed to a chair (including a wheelchair)?: A Little Help needed standing up from a chair using your arms (e.g., wheelchair or bedside chair)?: A Little Help needed to walk in hospital room?: A Lot Help needed climbing 3-5 steps with a railing? : A Lot 6 Click Score: 18    End of Session Equipment Utilized During Treatment: Oxygen Activity Tolerance: Patient tolerated treatment well;Patient limited by fatigue Patient left: in chair;with call bell/phone within reach Nurse Communication: Mobility status PT Visit Diagnosis: Unsteadiness on feet (R26.81);Other abnormalities of gait and mobility (R26.89);Muscle weakness (generalized) (M62.81)     Time:  7341-9379 PT Time Calculation (min) (ACUTE ONLY): 32 min  Charges:  $Therapeutic Exercise: 8-22 mins $Therapeutic Activity: 8-22 mins                     3:53 PM, 12/01/20 Ocie Bob, MPT Physical Therapist with [facility] 336 [phone] office [phone] mobile phone

## 2020-12-02 DIAGNOSIS — J441 Chronic obstructive pulmonary disease with (acute) exacerbation: Secondary | ICD-10-CM | POA: Diagnosis not present

## 2020-12-02 DIAGNOSIS — J9621 Acute and chronic respiratory failure with hypoxia: Secondary | ICD-10-CM | POA: Diagnosis not present

## 2020-12-02 DIAGNOSIS — J9601 Acute respiratory failure with hypoxia: Secondary | ICD-10-CM | POA: Diagnosis not present

## 2020-12-02 LAB — CBC
HCT: 43.7 % (ref 36.0–46.0)
Hemoglobin: 12.9 g/dL (ref 12.0–15.0)
MCH: 27.1 pg (ref 26.0–34.0)
MCHC: 29.5 g/dL — ABNORMAL LOW (ref 30.0–36.0)
MCV: 91.8 fL (ref 80.0–100.0)
Platelets: 646 10*3/uL — ABNORMAL HIGH (ref 150–400)
RBC: 4.76 MIL/uL (ref 3.87–5.11)
RDW: 17.4 % — ABNORMAL HIGH (ref 11.5–15.5)
WBC: 14.9 10*3/uL — ABNORMAL HIGH (ref 4.0–10.5)
nRBC: 0 % (ref 0.0–0.2)

## 2020-12-02 MED ORDER — VALACYCLOVIR HCL 500 MG PO TABS
2000.0000 mg | ORAL_TABLET | Freq: Two times a day (BID) | ORAL | Status: AC
  Administered 2020-12-02 (×2): 2000 mg via ORAL
  Filled 2020-12-02 (×3): qty 4

## 2020-12-02 MED ORDER — VALACYCLOVIR HCL 500 MG PO TABS
2000.0000 mg | ORAL_TABLET | Freq: Two times a day (BID) | ORAL | Status: DC
  Filled 2020-12-02 (×2): qty 4

## 2020-12-02 MED ORDER — PREDNISONE 20 MG PO TABS
50.0000 mg | ORAL_TABLET | Freq: Every day | ORAL | Status: DC
  Administered 2020-12-03 – 2020-12-05 (×3): 50 mg via ORAL
  Filled 2020-12-02 (×3): qty 1

## 2020-12-02 MED ORDER — BENZONATATE 100 MG PO CAPS
200.0000 mg | ORAL_CAPSULE | Freq: Two times a day (BID) | ORAL | Status: DC | PRN
  Administered 2020-12-02 – 2020-12-03 (×2): 200 mg via ORAL
  Filled 2020-12-02 (×2): qty 2

## 2020-12-02 MED ORDER — BUSPIRONE HCL 5 MG PO TABS
10.0000 mg | ORAL_TABLET | Freq: Three times a day (TID) | ORAL | Status: DC
  Administered 2020-12-02 – 2020-12-04 (×8): 10 mg via ORAL
  Filled 2020-12-02 (×8): qty 2

## 2020-12-02 NOTE — Progress Notes (Addendum)
PROGRESS NOTE  Debra [name] JKK:[id number] DOB: 25-Dec-1965 DOA: 11/22/2020 PCP: [name], MD   Brief History: 55 y.o.femalewith medical history significant ofanxiety, panic attacks, depression, osteoarthritis, chronic lower back pain, COPD/active smoker, GERD, history of hiatal hernia, history of bleeding ulcers, hypokalemia, history of pneumonia who is coming to the emergency department with complaints of fever and progressively worse dyspnea for the past 3 days.All other associated symptoms are occasionally productive of whitish sputum cough, fatigue, malaise, wheezing and headache. No travel history or sick contacts. No significant relief by medications at home. She has doubledher O2 requirement from 3 LPM to 6 LPM with some improvement. She mentions that she is supposed to use her oxygen at night, but for the past 3 days she has been using it during daytime as well.  ED Course:Initial vital signs were temperature101.5 F, pulse 118, respirations 22, BP 104/86 mmHg and O2 sat 87% on 6 LPM via nasal cannula. The patient is currently on 10 LPM via HFNC. He received acetaminophen at 1000 mg p.o. x1, a 1000 mL NS bolus, 8 mg of dexamethasone IVP x1 and doxycycline 100 mg IVPB every 12 hours was started.  Labwork: Coronavirus 2 and influenza PCR was negative. CBC showed a white count of 9.4 with 85% neutrophils, hemoglobin 10.0 g/dL and platelets 716. Lactic acid was normal. CMP showed normal sodium, chloride and CO2, but potassium level was 2.9 mmol/L. Glucose 121 and calcium 7.5 mg/dL. Renal function was normal. Total protein 6.3, albumin 3.0 g/dL. Alkaline phosphatase 130 units/L. AST, ALT and total bilirubin were normal.  12/03/19- -Medically stable for transfer with oxygen if SNF bed is available  Assessment/Plan: 1)Acute on chronic Respiratory Failure with Hypoxia -due to pneumonia and COPD exacerbation -chronically on 4L at home PTA -12/26 CTA  chest--no PE; RLL consolidation with bilateral patchy GGO -wean oxygen for saturation >90% -pulmonary consult appreciated-- Dr. [name] hypnotic medications if possible --Overall respiratory status improving slowly, oxygen requirement down to 4 L via nasal cannula from 11 L this admission -Patient still requires up to 6 L with activity but at rest requiring only 4 L via nasal cannula - repeat chest x-ray on 12/01/2019----showed improved aeration and resolving infiltrates Okay to transition from Solu-Medrol IV to p.o. prednisone  2)CAP/Lobar Pnuemonia --Completed cefepime and doxy--on 12/01/2020 for a total of 9 days -Repeat chest x-ray on 11/30/2020--as noted above -Leukocytosis is probably secondary to steroids -Clinically patient is much better with improved oxygen requirement  3)COPD exacerbation -Overall improved, please see #1 and #2 above, Okay to transition from Solu-Medrol IV to p.o. prednisone -continue mucinex -IS, flutter valve -Oxygen requirement improved as above #1  4)Anxiety --- anxiety and panic attacks improving with adjustments of medication -continue home dose klonopin 5 mg q 6 hrs prn anxiety -continue celexa, increase BuSpar 10 mg 3 times daily -Continue Remeron 15 mg nightly as patient has anorexia along with anxiety  5)Chronic pain syndrome -continue home dose percocet 7.5/325 and pregabalin -Patient is on benzos, avoid over aggressive escalation of opiates and benzo to avoid respiratory depression  6)Hyperlipidemia -noncompliant with statin -She declines statin therapy  7)Tobacco abuse -cessation discussed -nicoderm patch  8)--Generalized weakness and deconditioning----PT and OT eval appreciated - recommends SNF rehab -PTA pt lived alone and did very poorly, patient has significant limitations with mobility related ADLs- this patient needs to continue to be monitored in the hospital until a SNF bed is obtained as she is not safe to  go home  with her current physcical limitations  9) herpes simplex outbreak--- this is not unusual for patient, okay to treat empirically with Valtrex  Status is: Inpatient  Remains inpatient appropriate because: Awaiting transfer to SNF rehab   dispo: The patient is from:Home Anticipated d/c is to:SNF Anticipated d/c date is:1 days -Medically stable for transfer with oxygen if SNF bed is available   Family Communication:mother previously updated / I called and updated patient's son [name]--- [phone] , he plans to visit 12/02/2020  Consultants:PCCM--  Code Status: FULL   DVT Prophylaxis:  [place] Lovenox   Procedures: As Listed in Progress Note Above  Antibiotics: Cefepime 12/26>> 12/01/20 Doxy 12/26>>12/01/20   Subjective: - -Oxygen requirement back to baseline at rest, still has some dyspnea on exertion and slightly increased oxygen requirement with activity -intake is fair No fever  Or chills  No Nausea, Vomiting or Diarrhea  Objective: Vitals:   12/02/20 1300 12/02/20 1339 12/02/20 1400 12/02/20 1404  BP: 121/81   (!) 145/80  Pulse: (!) 108  (!) 106 (!) 106  Resp: (!) 25  (!) 23 (!) 23  Temp:  98.6 F (37 C)    TempSrc:  Oral    SpO2: 92%  92% 93%  Weight:      Height:        Intake/Output Summary (Last 24 hours) at 12/02/2020 1413 Last data filed at 12/02/2020 1000 Gross per 24 hour  Intake 370 ml  Output 1500 ml  Net -1130 ml   Weight change:  Exam:   General:  Pt is alert, follows commands appropriately, +ve DOE   HEENT: No icterus, No thrush, No neck mass, [place]/AT  Nose-[place] 4L/min  Cardiovascular: RRR, S1/S2, no rubs, no gallops  Respiratory: Improving air movement, very few scattered wheezes  abdomen: Soft/+BS, non tender, non distended, no guarding  Extremities: No edema, No lymphangitis, No petechiae, No rashes, no synovitis  Psych-- alert oriented x3, anxious affect  Neuro--generalized weakness  without new focal deficit   Data Reviewed: I have personally reviewed following labs and imaging studies Basic Metabolic Panel: Recent Labs  Lab 11/26/20 0235 11/27/20 0525 11/28/20 0624 11/29/20 0422 11/30/20 0507 12/01/20 0254  NA 140 141 140 139  --  139  K 3.5 4.3 3.4* 3.9  --  3.7  CL 102 101 101 99  --  101  CO2 29 28 29 29   --  26  GLUCOSE 87 123* 92 138*  --  133*  BUN 9 12 16 15   --  15  CREATININE 0.44 0.39* 0.39* 0.41* 0.42* 0.48  CALCIUM 7.9* 8.1* 8.5* 8.7*  --  8.7*  MG 2.3 2.2 2.2 2.1  --   --   PHOS  --   --   --  4.2  --   --    Liver Function Tests: Recent Labs  Lab 11/26/20 0235 11/27/20 0525 11/28/20 0624  AST 34 24 17  ALT 28 27 26   ALKPHOS 106 91 97  BILITOT 0.6 0.7 0.6  PROT 5.8* 5.8* 6.3*  ALBUMIN 2.6* 2.6* 3.0*   No results for input(s): LIPASE, AMYLASE in the last 168 hours. No results for input(s): AMMONIA in the last 168 hours. Coagulation Profile: No results for input(s): INR, PROTIME in the last 168 hours. CBC: Recent Labs  Lab 11/26/20 0235 11/27/20 0525 11/28/20 0624 11/29/20 0422 12/01/20 0254 12/02/20 0426  WBC 5.0 5.3 8.6 9.1 15.4* 14.9*  NEUTROABS 2.6 3.6 4.3  --   --   --  HGB 9.4* 9.1* 10.3* 11.4* 12.7 12.9  HCT 31.6* 30.1* 34.3* 37.8 42.6 43.7  MCV 93.5 91.2 90.5 90.2 93.6 91.8  PLT 239 331 473* 619* 607* 646*   Cardiac Enzymes: No results for input(s): CKTOTAL, CKMB, CKMBINDEX, TROPONINI in the last 168 hours. BNP: Invalid input(s): POCBNP CBG: No results for input(s): GLUCAP in the last 168 hours. HbA1C: No results for input(s): HGBA1C in the last 72 hours. Urine analysis:    Component Value Date/Time   COLORURINE YELLOW 07/12/2017 1455   APPEARANCEUR CLEAR 07/12/2017 1455   LABSPEC 1.005 07/12/2017 1455   PHURINE 8.0 07/12/2017 1455   GLUCOSEU NEGATIVE 07/12/2017 1455   HGBUR NEGATIVE 07/12/2017 1455   BILIRUBINUR NEGATIVE 07/12/2017 1455   KETONESUR 5 (A) 07/12/2017 1455   PROTEINUR NEGATIVE  07/12/2017 1455   NITRITE NEGATIVE 07/12/2017 1455   LEUKOCYTESUR NEGATIVE 07/12/2017 1455   Sepsis Labs: @LABRCNTIP (procalcitonin:4,lacticidven:4) ) Recent Results (from the past 240 hour(s))  Resp Panel by RT-PCR (Flu A&B, Covid) Nasopharyngeal Swab     Status: None   Collection Time: 11/22/20 10:15 PM   Specimen: Nasopharyngeal Swab; Nasopharyngeal(NP) swabs in vial transport medium  Result Value Ref Range Status   SARS Coronavirus 2 by RT PCR NEGATIVE NEGATIVE Final    Comment: (NOTE) SARS-CoV-2 target nucleic acids are NOT DETECTED.  The SARS-CoV-2 RNA is generally detectable in upper respiratory specimens during the acute phase of infection. The lowest concentration of SARS-CoV-2 viral copies this assay can detect is 138 copies/mL. A negative result does not preclude SARS-Cov-2 infection and should not be used as the sole basis for treatment or other patient management decisions. A negative result may occur with  improper specimen collection/handling, submission of specimen other than nasopharyngeal swab, presence of viral mutation(s) within the areas targeted by this assay, and inadequate number of viral copies(<138 copies/mL). A negative result must be combined with clinical observations, patient history, and epidemiological information. The expected result is Negative.  Fact Sheet for Patients:  11/24/20  Fact Sheet for Healthcare Providers:  BloggerCourse.com  This test is no t yet approved or cleared by the [place] FDA and  has been authorized for detection and/or diagnosis of SARS-CoV-2 by FDA under an Emergency Use Authorization (EUA). This EUA will remain  in effect (meaning this test can be used) for the duration of the COVID-19 declaration under Section 564(b)(1) of the Act, 21 U.S.C.section 360bbb-3(b)(1), unless the authorization is terminated  or revoked sooner.       Influenza A by PCR  NEGATIVE NEGATIVE Final   Influenza B by PCR NEGATIVE NEGATIVE Final    Comment: (NOTE) The Xpert Xpress SARS-CoV-2/FLU/RSV plus assay is intended as an aid in the diagnosis of influenza from Nasopharyngeal swab specimens and should not be used as a sole basis for treatment. Nasal washings and aspirates are unacceptable for Xpert Xpress SARS-CoV-2/FLU/RSV testing.  Fact Sheet for Patients: [place]  Fact Sheet for Healthcare Providers: BloggerCourse.com  This test is not yet approved or cleared by the [place] FDA and has been authorized for detection and/or diagnosis of SARS-CoV-2 by FDA under an Emergency Use Authorization (EUA). This EUA will remain in effect (meaning this test can be used) for the duration of the COVID-19 declaration under Section 564(b)(1) of the Act, 21 U.S.C. section 360bbb-3(b)(1), unless the authorization is terminated or revoked.  Performed at [facility], [address], [place], [place]   Blood Culture (routine x 2)     Status: None  Collection Time: 11/22/20 11:06 PM   Specimen: BLOOD RIGHT ARM  Result Value Ref Range Status   Specimen Description BLOOD RIGHT ARM  Final   Special Requests   Final    BOTTLES DRAWN AEROBIC AND ANAEROBIC Blood Culture adequate volume   Culture   Final    NO GROWTH 5 DAYS Performed at [facility], [address] [place], [place] [address]    Report Status 11/27/2020 FINAL  Final  Blood Culture (routine x 2)     Status: None   Collection Time: 11/22/20 11:20 PM   Specimen: Left Antecubital; Blood  Result Value Ref Range Status   Specimen Description LEFT ANTECUBITAL  Final   Special Requests   Final    BOTTLES DRAWN AEROBIC AND ANAEROBIC Blood Culture adequate volume   Culture   Final    NO GROWTH 5 DAYS Performed at [facility], [address], [place], [place] [address]    Report Status 11/27/2020 FINAL  Final  Resp Panel by RT-PCR  (Flu A&B, Covid) Nasopharyngeal Swab     Status: None   Collection Time: 11/23/20  8:54 AM   Specimen: Nasopharyngeal Swab; Nasopharyngeal(NP) swabs in vial transport medium  Result Value Ref Range Status   SARS Coronavirus 2 by RT PCR NEGATIVE NEGATIVE Final    Comment: (NOTE) SARS-CoV-2 target nucleic acids are NOT DETECTED.  The SARS-CoV-2 RNA is generally detectable in upper respiratory specimens during the acute phase of infection. The lowest concentration of SARS-CoV-2 viral copies this assay can detect is 138 copies/mL. A negative result does not preclude SARS-Cov-2 infection and should not be used as the sole basis for treatment or other patient management decisions. A negative result may occur with  improper specimen collection/handling, submission of specimen other than nasopharyngeal swab, presence of viral mutation(s) within the areas targeted by this assay, and inadequate number of viral copies(<138 copies/mL). A negative result must be combined with clinical observations, patient history, and epidemiological information. The expected result is Negative.  Fact Sheet for Patients:  EntrepreneurPulse.com.au  Fact Sheet for Healthcare Providers:  IncredibleEmployment.be  This test is no t yet approved or cleared by the [place] FDA and  has been authorized for detection and/or diagnosis of SARS-CoV-2 by FDA under an Emergency Use Authorization (EUA). This EUA will remain  in effect (meaning this test can be used) for the duration of the COVID-19 declaration under Section 564(b)(1) of the Act, 21 U.S.C.section 360bbb-3(b)(1), unless the authorization is terminated  or revoked sooner.       Influenza A by PCR NEGATIVE NEGATIVE Final   Influenza B by PCR NEGATIVE NEGATIVE Final    Comment: (NOTE) The Xpert Xpress SARS-CoV-2/FLU/RSV plus assay is intended as an aid in the diagnosis of influenza from Nasopharyngeal swab specimens  and should not be used as a sole basis for treatment. Nasal washings and aspirates are unacceptable for Xpert Xpress SARS-CoV-2/FLU/RSV testing.  Fact Sheet for Patients: EntrepreneurPulse.com.au  Fact Sheet for Healthcare Providers: IncredibleEmployment.be  This test is not yet approved or cleared by the [place] FDA and has been authorized for detection and/or diagnosis of SARS-CoV-2 by FDA under an Emergency Use Authorization (EUA). This EUA will remain in effect (meaning this test can be used) for the duration of the COVID-19 declaration under Section 564(b)(1) of the Act, 21 U.S.C. section 360bbb-3(b)(1), unless the authorization is terminated or revoked.  Performed at [facility], [address], [place], [place] [address]   MRSA PCR Screening  Status: None   Collection Time: 11/23/20  9:22 AM   Specimen: Nasal Mucosa; Nasopharyngeal  Result Value Ref Range Status   MRSA by PCR NEGATIVE NEGATIVE Final    Comment:        The GeneXpert MRSA Assay (FDA approved for NASAL specimens only), is one component of a comprehensive MRSA colonization surveillance program. It is not intended to diagnose MRSA infection nor to guide or monitor treatment for MRSA infections. Performed at [facility], [address] [place], [place] [address]      Scheduled Meds: . arformoterol  15 mcg Nebulization BID  . budesonide (PULMICORT) nebulizer solution  0.5 mg Nebulization BID  . busPIRone  7.5 mg Oral TID  . Chlorhexidine Gluconate Cloth  6 each Topical Daily  . citalopram  40 mg Oral Daily  . enoxaparin (LOVENOX) injection  40 mg Subcutaneous Q24H  . feeding supplement  237 mL Oral BID BM  . guaiFENesin  1,200 mg Oral BID  . methylPREDNISolone (SOLU-MEDROL) injection  40 mg Intravenous Q12H  . mirtazapine  15 mg Oral QHS  . multivitamin with minerals  1 tablet Oral Daily  . nicotine  21 mg Transdermal Daily  . oxymetazoline  1 spray  Each Nare BID  . pantoprazole  40 mg Oral Daily  . potassium chloride  10 mEq Oral BID  . pregabalin  200 mg Oral QHS  . revefenacin  175 mcg Nebulization Daily  . valACYclovir  2,000 mg Oral BID   Continuous Infusions:   Procedures/Studies: DG Chest 2 View  Result Date: 11/30/2020 CLINICAL DATA:  Worsening shortness of breath. EXAM: CHEST - 2 VIEW COMPARISON:  Chest x-ray 11/27/2020 FINDINGS: The cardiac silhouette, mediastinal and hilar contours are within normal limits. The lungs demonstrate stable underlying emphysematous changes. Improved lung aeration with resolving interstitial infiltrates. No pleural effusions or pneumothorax. The bony thorax is intact. IMPRESSION: Stable underlying emphysematous changes. Improved lung aeration with resolving infiltrates. Electronically Signed   By: Rudie MeyerP.  Gallerani M.D.   On: 11/30/2020 12:30   CT ANGIO CHEST PE W OR WO CONTRAST  Result Date: 11/23/2020 CLINICAL DATA:  Shortness of breath and left-sided chest pain with decreased oxygen saturation EXAM: CT ANGIOGRAPHY CHEST WITH CONTRAST TECHNIQUE: Multidetector CT imaging of the chest was performed using the standard protocol during bolus administration of intravenous contrast. Multiplanar CT image reconstructions and MIPs were obtained to evaluate the vascular anatomy. CONTRAST:  75mL OMNIPAQUE IOHEXOL 350 MG/ML SOLN COMPARISON:  Chest x-ray from the previous day. FINDINGS: Cardiovascular: Thoracic aorta shows minimal atherosclerotic calcifications. No aneurysmal dilatation or dissection is noted. No cardiac enlargement is seen. The coronary arteries demonstrate mild calcification. Pulmonary artery is well visualized within normal branching pattern. No findings to suggest pulmonary emboli are noted. Mediastinum/Nodes: Thoracic inlet is within normal limits. Scattered hilar and subcarinal adenopathy is noted likely reactive in nature. The esophagus is within normal limits. Lungs/Pleura: Consolidation is noted  in the right lower lobe as well as scattered predominately peripheral airspace opacities within both lungs. No sizable effusion is noted. No sizable parenchymal nodules are noted. Upper Abdomen: No acute abnormality noted. Musculoskeletal: No acute bony abnormality is noted Review of the MIP images confirms the above findings. IMPRESSION: Right lower lobe consolidation with patchy ground-glass airspace opacities bilaterally consistent with multifocal pneumonia. Correlate with COVID-19 testing. No evidence of pulmonary emboli. Aortic Atherosclerosis (ICD10-I70.0). Electronically Signed   By: Alcide CleverMark  Lukens M.D.   On: 11/23/2020 09:09   [facility] [address] View  Result Date: 11/27/2020 CLINICAL DATA:  Hypoxemia.  Acute respiratory failure. EXAM: PORTABLE CHEST 1 VIEW COMPARISON:  One-view chest x-ray 11/25/2020 FINDINGS: Heart size is normal. Aeration at the lung bases is improving. Interstitial coarsening is again noted. IMPRESSION: Improving aeration at the lung bases bilaterally. Electronically Signed   By: Marin Roberts M.D.   On: 11/27/2020 16:03   DG CHEST PORT 1 VIEW  Result Date: 11/25/2020 CLINICAL DATA:  Shortness of breath.  History of COPD. EXAM: PORTABLE CHEST 1 VIEW COMPARISON:  Single-view of the chest 11/22/2020. PA and lateral chest 08/30/2018. CT chest 11/23/2020. FINDINGS: The lungs are emphysematous. Patchy bilateral airspace disease most compatible with pneumonia has progressed with new opacities seen in the upper lung zones bilaterally. No pneumothorax or pleural fluid. Heart size is normal. Aortic atherosclerosis. IMPRESSION: Patchy bilateral airspace disease compatible with plain film pneumonia has worsened since the most recent plain film comparison. Aortic Atherosclerosis (ICD10-I70.0) and Emphysema (ICD10-J43.9). Electronically Signed   By: Drusilla Kanner M.D.   On: 11/25/2020 10:23   DG Chest Portable 1 View  Result Date: 11/22/2020 CLINICAL DATA:  Shortness of breath,  COPD. EXAM: PORTABLE CHEST 1 VIEW COMPARISON:  Chest x-ray 11/12/2019, CT chest 02/07/2020 FINDINGS: The heart size and mediastinal contours are within normal limits. Hyperinflation of the lungs consistent with emphysematous changes. Interval increase in interstitial markings diffusely but more prominent within the lower lobes. Hazy patchy bilateral lower lobe hazy airspace opacities. No pleural effusion. No pneumothorax. No acute osseous abnormality. IMPRESSION: Increased interstitial markings and hazy airspace opacities more prominent within the lower lobes. Findings likely represent infection/inflammation. COVID-19 infection not excluded. Followup PA and lateral chest X-ray is recommended in 3-4 weeks following therapy to ensure resolution and exclude underlying malignancy. Electronically Signed   By: Tish Frederickson M.D.   On: 11/22/2020 21:40   Garnet Overfield,MD  [facility]  If 7PM-7AM, please contact night-coverage [email or web address] Password [email or web address] 12/02/2020, 2:13 PM   LOS: 9 days

## 2020-12-02 NOTE — Progress Notes (Signed)
Physical Therapy Treatment Patient Details Name: Debra [name] MRN: [id number] DOB: 12/15/1965 Today's Date: 12/02/2020    History of Present Illness Debra [name] is a 55 y.o. female with medical history significant of anxiety, panic attacks, depression, osteoarthritis, chronic lower back pain, COPD/active smoker, GERD, history of hiatal hernia, history of bleeding ulcers, hypokalemia, history of pneumonia who is coming to the emergency department with complaints of fever and progressively worse dyspnea for the past 3 days.  All other associated symptoms are occasionally productive of whitish sputum cough, fatigue, malaise, wheezing and headache.  No travel history or sick contacts.  No significant relief by medications at home.  She has doubled her O2 requirement from 3 LPM to 6 LPM with some improvement.  She mentions that she is supposed to use her oxygen at night, but for the past 3 days she has been using it during daytime as well.  She denies hemoptysis, chest pain, palpitations, dizziness, diaphoresis, PND, orthopnea or recent pitting edema of the lower extremities.  She denies abdominal pain, nausea, vomiting, diarrhea, constipation, melena or hematochezia.  No dysuria, frequency or hematuria.  No polyuria, polydipsia, polyphagia or blurred vision.    PT Comments    Pt progressing very slowly with therapy, today ambulates 5 feet using RW and min A due to fear of falling and general weakness/unsteadiness on feet. Pt requires significant seated rest breaks after position changes, cued for pursed lip breathing with moderate SOB noted. Pt tolerates a few BLE strengthening exercises once seated in chair, PT using tactile cue to encourage increased ROM with exercises. Pt on 3L O2 with SpO2 90-91% with mobility. PT using soothing techniques to encourage pt with mobility and heavy cues for sequencing with tasks to decrease with good results. Updated d/c recommendations to SNF due to assist level and slow  progress with acute PT. Pt will benefit from continued physical therapy in hospital and recommendations below to increase strength, balance, endurance for safe ADLs and gait.   Follow Up Recommendations  SNF     Equipment Recommendations  None recommended by PT    Recommendations for Other Services       Precautions / Restrictions Precautions Precautions: Fall Precaution Comments: panic attacks Restrictions Weight Bearing Restrictions: No    Mobility  Bed Mobility Overal bed mobility: Needs Assistance Bed Mobility: Supine to Sit  Supine to sit: Supervision    General bed mobility comments: slow, labored movement, cued for use of bedrail to assist in uprighting trunk and scooting out to EOB, requires ~3 min seated rest break cued for pursed lip breathing once seated EOB  Transfers Overall transfer level: Needs assistance Equipment used: Rolling walker (2 wheeled) Transfers: Sit to/from Stand Sit to Stand: Min guard    General transfer comment: slow to rise, BUE assisting to push up from bed and therapist steadying, cued for pursed lip breathing with mobility  Ambulation/Gait Ambulation/Gait assistance: Min assist Gait Distance (Feet): 5 Feet Assistive device: Rolling walker (2 wheeled) Gait Pattern/deviations: Step-through pattern;Decreased stride length Gait velocity: decreased   General Gait Details: short, slow steps, pt voices fear of falling and "I feel so weak" but able to take a few unsteady steps to chair, requires ~5 min seated rest once in chair and cued for pursed lip breathing   Stairs             Wheelchair Mobility    Modified Rankin (Stroke Patients Only)       Balance Overall balance assessment: Needs assistance Sitting-balance  support: Feet supported;No upper extremity supported Sitting balance-Leahy Scale: Good Sitting balance - Comments: seated at EOB   Standing balance support: During functional activity;Bilateral upper extremity  supported Standing balance-Leahy Scale: Poor Standing balance comment: reliant on UE support          Cognition Arousal/Alertness: Awake/alert Behavior During Therapy: WFL for tasks assessed/performed Overall Cognitive Status: Within Functional Limits for tasks assessed       Exercises General Exercises - Lower Extremity Long Arc Quad: Seated;AROM;Strengthening;Both;15 reps Heel Raises: Seated;AROM;Strengthening;Both;20 reps    General Comments General comments (skin integrity, edema, etc.): pt on 3L O2 with SpO2 90-91% with mobility, mild to moderate SOB noted and cued for pursed lip breathing to improve      Pertinent Vitals/Pain Pain Assessment: No/denies pain    Home Living                      Prior Function            PT Goals (current goals can now be found in the care plan section) Acute Rehab PT Goals Patient Stated Goal: return home PT Goal Formulation: With patient Time For Goal Achievement: 12/05/20 Potential to Achieve Goals: Good Progress towards PT goals: Progressing toward goals (slowly)    Frequency    Min 3X/week      PT Plan Discharge plan needs to be updated    Co-evaluation              AM-PAC PT "6 Clicks" Mobility   Outcome Measure  Help needed turning from your back to your side while in a flat bed without using bedrails?: A Little Help needed moving from lying on your back to sitting on the side of a flat bed without using bedrails?: A Little Help needed moving to and from a bed to a chair (including a wheelchair)?: A Little Help needed standing up from a chair using your arms (e.g., wheelchair or bedside chair)?: A Little Help needed to walk in hospital room?: A Lot Help needed climbing 3-5 steps with a railing? : A Lot 6 Click Score: 16    End of Session Equipment Utilized During Treatment: Oxygen Activity Tolerance: Patient limited by fatigue Patient left: in chair;with call bell/phone within reach;with chair  alarm set Nurse Communication: Mobility status;Other (comment) (purewick removed) PT Visit Diagnosis: Unsteadiness on feet (R26.81);Other abnormalities of gait and mobility (R26.89);Muscle weakness (generalized) (M62.81)     Time: 1105-1130 PT Time Calculation (min) (ACUTE ONLY): 25 min  Charges:  $Therapeutic Exercise: 8-22 mins $Therapeutic Activity: 8-22 mins                      Tori Bryant Lipps PT, DPT 12/02/20, 12:27 PM [phone]

## 2020-12-02 NOTE — Progress Notes (Signed)
Spoke with pt's son, [name], by phone.  Updated about current respiratory status and treatment plan.  Coralyn Helling, MD [facility] Pulmonary/Critical Care Pager - [phone] 12/02/2020, 3:37 PM

## 2020-12-02 NOTE — TOC Initial Note (Addendum)
Transition of Care [facility]) - Initial/Assessment Note    Patient Details  Name: Debra [name] MRN: [id number] Date of Birth: August 02, 1966  Transition of Care [facility]) CM/SW Contact:    Leitha Bleak, RN Phone Number: 12/02/2020, 2:33 PM  Clinical Narrative:       Patient admitted with Acute respiratory failure. Patient lives home alone, is very weak. PT is recommending SNF, patient is agreeable. Sent referral to [facility] and faxed to [employer] first choices. Sent to other local SNF. TOC will discuss bed offers with patient and son - Debra [name].  PASSR is pending, uploaded documents requested.  CPAP application given to patient to request Cpap aide.     Addendum : Per patient PCP office will help get services needed at home. TOC spoke to nurse Amy at Dr Bryna Colander office. If patient is unable to get a SNF bed offer. Fax [phone] them clinicals and they will work on Cpap aide.          Expected Discharge Plan: Skilled Nursing Facility Barriers to Discharge: Continued Medical Work up   Patient Goals and CMS Choice Patient states their goals for this hospitalization and ongoing recovery are:: to go to SNF the home. CMS Medicare.gov Compare Post Acute Care list provided to:: Patient Choice offered to / list presented to : Patient  Expected Discharge Plan and Services Expected Discharge Plan: Skilled Nursing Facility       Living arrangements for the past 2 months: Single Family Home                   Prior Living Arrangements/Services Living arrangements for the past 2 months: Single Family Home Lives with:: Self          Need for Family Participation in Patient Care: Yes (Comment) Care giver support system in place?: Yes (comment) Current home services: DME Criminal Activity/Legal Involvement Pertinent to Current Situation/Hospitalization: No - Comment as needed  Activities of Daily Living Home Assistive Devices/Equipment: None ADL Screening (condition at time of  admission) Patient's cognitive ability adequate to safely complete daily activities?: Yes Is the patient deaf or have difficulty hearing?: No Does the patient have difficulty seeing, even when wearing glasses/contacts?: No Does the patient have difficulty concentrating, remembering, or making decisions?: No Patient able to express need for assistance with ADLs?: Yes Does the patient have difficulty dressing or bathing?: No Independently performs ADLs?: Yes (appropriate for developmental age) Does the patient have difficulty walking or climbing stairs?: No Weakness of Legs: None Weakness of Arms/Hands: None  Permission Sought/Granted      Share Information with NAME: [name]     Permission granted to share info w Relationship: son     Emotional Assessment     Affect (typically observed): Accepting Orientation: : Oriented to Self,Oriented to Place,Oriented to Situation Alcohol / Substance Use: Not Applicable Psych Involvement: No (comment)  Admission diagnosis:  Acute respiratory failure with hypoxia (HCC) [J96.01] Acute on chronic respiratory failure with hypoxia (HCC) [J96.21] Community acquired pneumonia, unspecified laterality [J18.9] Patient Active Problem List   Diagnosis Date Noted  . Lobar pneumonia (HCC) 11/26/2020  . CAP (community acquired pneumonia) 11/23/2020  . Acute on chronic respiratory failure with hypoxia (HCC) 11/23/2020  . Acute respiratory failure with hypoxia (HCC) 11/22/2020  . Mild protein malnutrition (HCC) 11/22/2020  . Normocytic anemia 11/22/2020  . Hyperlipidemia 11/22/2020  . COPD with acute exacerbation (HCC) 11/22/2020  . Tobacco use 11/22/2020  . Lumbar adjacent segment disease with spondylolisthesis 07/25/2020  . Spondylolisthesis  of lumbar region 06/07/2018  . Abnormal CT scan, stomach   . Precordial chest pain   . Nausea 07/12/2017  . Epigastric pain 07/12/2017  . GERD (gastroesophageal reflux disease) 07/12/2017  . Elevated troponin  07/12/2017  . Hypokalemia 07/12/2017  . Anxiety 07/12/2017  . Prolonged QT interval 07/12/2017  . HNP (herniated nucleus pulposus), cervical 05/18/2017   PCP:  [name], MD Pharmacy:   [facility] [phone] [place], [place] - [address] [place] [address] Phone: [phone] Fax: [phone]   Readmission Risk Interventions Readmission Risk Prevention Plan 12/02/2020  Transportation Screening Complete  Home Care Screening Complete  Medication Review (RN CM) Complete  Some recent data might be hidden

## 2020-12-02 NOTE — TOC Progression Note (Signed)
30 day Note    Patient Details  Name: Debra [name] MRN: [id number] Date of Birth: [birth year]  Transition of Care [facility]) CM/SW Contact  Leitha Bleak, RN Phone Number: 12/02/2020, 3:06 PM  To whom it May Concern:  Please be advised that the above name patient will require a short-term nursing home stay, anticipated 30 days or less rehabilitation and strengthening. The plan is for return home.   Expected Discharge Plan: Skilled Nursing Facility Barriers to Discharge: Continued Medical Work up  Expected Discharge Plan and Services Expected Discharge Plan: Skilled Nursing Facility

## 2020-12-02 NOTE — Progress Notes (Signed)
NAME:  Debra [name], MRN:  [id number], DOB:  10-26-1966, LOS: 9 ADMISSION DATE:  11/22/2020, CONSULTATION DATE:  12/30 REFERRING MD:  Tat/triad, CHIEF COMPLAINT:  resp distress    Brief History:  55 yo female smoker presented with fever (Temp 101.70F) and dyspnea x 3 days.  Associated with cough, fatigue, wheezing and headache.  Started on treatment for pneumonia and COPD exacerbation.  Slow to improve, and PCCM asked to assist with respiratory management.  Past Medical History:  Anxiety, HH, PUD, GERD, Depression, COPD, Low back pain, Arthritis   Significant Hospital Events:  12/25 Admit  Consults:    Procedures:    Significant Diagnostic Tests:   PFT 08/30/18 >> FEV1 1.47 (49%), FEV1% 49, TLC 7.08 (132%), DLCO 42%  CT angio chest 11/23/20 >> RLL consolidation with patchy GGO b/l  Micro Data:  COVID/flu 12/25 >> negative Blood 12/25 >> negative  MRSA PCR  12/26 > negative COVID/flu 12/16 >> negative Pneumococcal Ag 12/26 >> negative Legionella Ag 12/26 >> negative  Antimicrobials:  Doxycycline 12/25 >> 1/02 Cefepime 12/26 >> 1/02  Interim History / Subjective:  Trying to be more proactive with PT.  Still feels weak.  Objective   Blood pressure (!) 145/80, pulse (!) 106, temperature 98.6 F (37 C), temperature source Oral, resp. rate (!) 23, height 5\' 7"  (1.702 m), weight 68.1 kg, SpO2 93 %.        Intake/Output Summary (Last 24 hours) at 12/02/2020 1453 Last data filed at 12/02/2020 1000 Gross per 24 hour  Intake 370 ml  Output 1500 ml  Net -1130 ml   Filed Weights   11/24/20 1820 11/25/20 0500 12/02/20 0449  Weight: 75 kg 75 kg 68.1 kg    Examination:  General - alert Eyes - pupils reactive ENT - no sinus tenderness, no stridor Cardiac - regular rate/rhythm, no murmur Chest - better air movement, no wheeze Abdomen - soft, non tender, + bowel sounds Extremities - no cyanosis, clubbing, or edema Skin - no rashes Neuro - normal strength, moves  extremities, follows commands Psych - normal mood and behavior   Resolved Hospital Problem list   Community acquired pneumonia  Assessment & Plan:   Acute on chronic hypoxic respiratory failure. - goal SpO2 90 to 95%  COPD exacerbation. - continue brovana, pulmicort, yupelri - likely can transition to prednisone soon and wean off as tolerated - prn albuterol  Tobacco abuse. - nicotine patch  Thrombocytosis. - per primary team  Anxiety, chronic pain. - per primary team  Deconditioning. - encouraged her to participate with physical therapy   Best practice:  Diet: heart healthy DVT prophylaxis: lovenox GI prophylaxis: protonix Mobility: as tolerated Goals of care: full code; wouldn't want long term vent support Disposition:ICU   Labs    CMP Latest Ref Rng & Units 12/01/2020 11/30/2020 11/29/2020  Glucose 70 - 99 mg/dL 01/27/2021) - 741(O)  BUN 6 - 20 mg/dL 15 - 15  Creatinine 878(M - 1.00 mg/dL 7.67 2.09) 4.70(J)  Sodium 135 - 145 mmol/L 139 - 139  Potassium 3.5 - 5.1 mmol/L 3.7 - 3.9  Chloride 98 - 111 mmol/L 101 - 99  CO2 22 - 32 mmol/L 26 - 29  Calcium 8.9 - 10.3 mg/dL 6.28(Z) - 8.7(L)  Total Protein 6.5 - 8.1 g/dL - - -  Total Bilirubin 0.3 - 1.2 mg/dL - - -  Alkaline Phos 38 - 126 U/L - - -  AST 15 - 41 U/L - - -  ALT 0 -  44 U/L - - -    CBC Latest Ref Rng & Units 12/02/2020 12/01/2020 11/29/2020  WBC 4.0 - 10.5 K/uL 14.9(H) 15.4(H) 9.1  Hemoglobin 12.0 - 15.0 g/dL 44.6 95.0 11.4(L)  Hematocrit 36.0 - 46.0 % 43.7 42.6 37.8  Platelets 150 - 400 K/uL 646(H) 607(H) 619(H)    ABG    Component Value Date/Time   PHART 7.501 (H) 11/27/2020 1610   PCO2ART 35.8 11/27/2020 1610   PO2ART 74.3 (L) 11/27/2020 1610   HCO3 28.7 (H) 11/27/2020 1610   TCO2 30 07/25/2020 1525   O2SAT 96.1 11/27/2020 1610   Signature:  Coralyn Helling, MD [facility] Pulmonary/Critical Care Pager - [phone] 12/02/2020, 2:56 PM

## 2020-12-02 NOTE — NC FL2 (Signed)
[place] MEDICAID FL2 LEVEL OF CARE SCREENING TOOL     IDENTIFICATION  Patient Name: Debra [name] Birthdate: Nov 20, 1966 Sex: female Admission Date (Current Location): 11/22/2020  [facility] and [place] Number:  [employer] and Address:  [facility],  [address] [place] [address]      Provider Number: [id number]  Attending Physician Name and Address:  Shon Hale, MD  Relative Name and Phone Number:  Bobi Daudelin - son [phone]    Current Level of Care: Hospital Recommended Level of Care: Skilled Nursing Facility Prior Approval Number:    Date Approved/Denied:   PASRR Number:    Discharge Plan: SNF    Current Diagnoses: Patient Active Problem List   Diagnosis Date Noted  . Lobar pneumonia (HCC) 11/26/2020  . CAP (community acquired pneumonia) 11/23/2020  . Acute on chronic respiratory failure with hypoxia (HCC) 11/23/2020  . Acute respiratory failure with hypoxia (HCC) 11/22/2020  . Mild protein malnutrition (HCC) 11/22/2020  . Normocytic anemia 11/22/2020  . Hyperlipidemia 11/22/2020  . COPD with acute exacerbation (HCC) 11/22/2020  . Tobacco use 11/22/2020  . Lumbar adjacent segment disease with spondylolisthesis 07/25/2020  . Spondylolisthesis of lumbar region 06/07/2018  . Abnormal CT scan, stomach   . Precordial chest pain   . Nausea 07/12/2017  . Epigastric pain 07/12/2017  . GERD (gastroesophageal reflux disease) 07/12/2017  . Elevated troponin 07/12/2017  . Hypokalemia 07/12/2017  . Anxiety 07/12/2017  . Prolonged QT interval 07/12/2017  . HNP (herniated nucleus pulposus), cervical 05/18/2017    Orientation RESPIRATION BLADDER Height & Weight     Self,Time,Situation,Place  O2 (3L) External catheter Weight: 68.1 kg Height:  5\' 7"  (170.2 cm)  BEHAVIORAL SYMPTOMS/MOOD NEUROLOGICAL BOWEL NUTRITION STATUS      Continent Diet (See DC summary)  AMBULATORY STATUS COMMUNICATION OF NEEDS Skin   Extensive Assist Verbally  Normal                       Personal Care Assistance Level of Assistance  Bathing,Dressing,Feeding Bathing Assistance: Maximum assistance Feeding assistance: Independent Dressing Assistance: Limited assistance     Functional Limitations Info  Sight,Hearing,Speech Sight Info: Adequate Hearing Info: Adequate Speech Info: Adequate    SPECIAL CARE FACTORS FREQUENCY                       Contractures Contractures Info: Not present    Additional Factors Info  Code Status,Allergies Code Status Info: FULL Allergies Info: cefaclor, sertraline, statins, amox, codeine, latex, levaquin, Nsaids, cirpro           Current Medications (12/02/2020):  This is the current hospital active medication list Current Facility-Administered Medications  Medication Dose Route Frequency Provider Last Rate Last Admin  . acetaminophen (TYLENOL) tablet 650 mg  650 mg Oral Q6H PRN 01/30/2021, MD   650 mg at 12/02/20 1207   Or  . acetaminophen (TYLENOL) suppository 650 mg  650 mg Rectal Q6H PRN 1208, MD      . albuterol (PROVENTIL) (2.5 MG/3ML) 0.083% nebulizer solution 2.5 mg  2.5 mg Nebulization Q4H PRN Bobette Mo, MD   2.5 mg at 11/30/20 1545  . arformoterol (BROVANA) nebulizer solution 15 mcg  15 mcg Nebulization BID 01/28/21, MD   15 mcg at 12/02/20 0805  . budesonide (PULMICORT) nebulizer solution 0.5 mg  0.5 mg Nebulization BID Tat, David, MD   0.5 mg at 12/02/20 0805  . busPIRone (BUSPAR)  tablet 7.5 mg  7.5 mg Oral TID Shon Hale, MD   7.5 mg at 12/02/20 0853  . Chlorhexidine Gluconate Cloth 2 % PADS 6 each  6 each Topical Daily Cleora Fleet, MD   6 each at 12/02/20 0856  . citalopram (CELEXA) tablet 40 mg  40 mg Oral Daily Bobette Mo, MD   40 mg at 12/02/20 0853  . diazepam (VALIUM) tablet 5 mg  5 mg Oral Q6H PRN Bobette Mo, MD   5 mg at 12/02/20 8938  . enoxaparin (LOVENOX) injection 40 mg  40 mg Subcutaneous Q24H  Bobette Mo, MD   40 mg at 12/02/20 0855  . feeding supplement (ENSURE ENLIVE / ENSURE PLUS) liquid 237 mL  237 mL Oral BID BM Johnson, Clanford L, MD   237 mL at 11/26/20 0833  . guaiFENesin (MUCINEX) 12 hr tablet 1,200 mg  1,200 mg Oral BID Laural Benes, Clanford L, MD   1,200 mg at 12/02/20 0853  . methylPREDNISolone sodium succinate (SOLU-MEDROL) 40 mg/mL injection 40 mg  40 mg Intravenous Pablo Ledger, MD   40 mg at 12/02/20 0742  . mirtazapine (REMERON SOL-TAB) disintegrating tablet 15 mg  15 mg Oral QHS Emokpae, Courage, MD   15 mg at 12/01/20 2203  . multivitamin with minerals tablet 1 tablet  1 tablet Oral Daily Johnson, Clanford L, MD   1 tablet at 12/02/20 0853  . naloxone [facility]) injection 0.4 mg  0.4 mg Intravenous PRN Johnson, Clanford L, MD      . nicotine (NICODERM CQ - dosed in mg/24 hours) patch 21 mg  21 mg Transdermal Daily Bobette Mo, MD   21 mg at 12/02/20 0853  . nystatin cream (MYCOSTATIN)   Topical BID PRN Laural Benes, Clanford L, MD      . ondansetron (ZOFRAN) tablet 4 mg  4 mg Oral Q6H PRN Bobette Mo, MD       Or  . ondansetron [facility]) injection 4 mg  4 mg Intravenous Q6H PRN Bobette Mo, MD      . oxyCODONE-acetaminophen (PERCOCET/ROXICET) 5-325 MG per tablet 1.5 tablet  1.5 tablet Oral Q8H PRN Standley Dakins L, MD   1.5 tablet at 12/02/20 0742  . oxymetazoline (AFRIN) 0.05 % nasal spray 1 spray  1 spray Each Nare BID Audrea Muscat T, NP   1 spray at 12/02/20 0856  . pantoprazole (PROTONIX) EC tablet 40 mg  40 mg Oral Daily Bobette Mo, MD   40 mg at 12/02/20 0853  . potassium chloride (KLOR-CON) CR tablet 10 mEq  10 mEq Oral BID Bobette Mo, MD   10 mEq at 12/02/20 0853  . pregabalin (LYRICA) capsule 200 mg  200 mg Oral QHS Bobette Mo, MD   200 mg at 12/01/20 2203  . revefenacin (YUPELRI) nebulizer solution 175 mcg  175 mcg Nebulization Daily Nyoka Cowden, MD   175 mcg at 12/02/20 0805  . saline (AYR) nasal  gel with aloe 1 application  1 application Each Nare Q4H PRN Tat, David, MD      . valACYclovir (VALTREX) tablet 2,000 mg  2,000 mg Oral BID Shon Hale, MD   2,000 mg at 12/02/20 1006     Discharge Medications: Please see discharge summary for a list of discharge medications.  Relevant Imaging Results:  Relevant Lab Results:   Additional Information SS# [id number]  Leitha Bleak, RN

## 2020-12-03 ENCOUNTER — Telehealth: Payer: Self-pay | Admitting: Pulmonary Disease

## 2020-12-03 DIAGNOSIS — J441 Chronic obstructive pulmonary disease with (acute) exacerbation: Secondary | ICD-10-CM | POA: Diagnosis not present

## 2020-12-03 MED ORDER — ACYCLOVIR 5 % EX CREA
TOPICAL_CREAM | Freq: Four times a day (QID) | CUTANEOUS | Status: DC
  Administered 2020-12-03: 1 via TOPICAL
  Filled 2020-12-03: qty 5

## 2020-12-03 MED ORDER — BENZONATATE 100 MG PO CAPS
200.0000 mg | ORAL_CAPSULE | Freq: Three times a day (TID) | ORAL | Status: DC | PRN
  Administered 2020-12-03 – 2020-12-05 (×5): 200 mg via ORAL
  Filled 2020-12-03 (×5): qty 2

## 2020-12-03 NOTE — Telephone Encounter (Signed)
Patient previously seen by Dr. Tonia Brooms.  Seen by me during her hospitalization in [facility].  She would prefer to have follow up in [place] since it is closer to her home.  Will route message to Dr. Tonia Brooms.  If no issues with transferring care to [facility] office, then please schedule HFU with me in 4 weeks.

## 2020-12-03 NOTE — TOC Progression Note (Signed)
Transition of Care [facility]) - Progression Note    Patient Details  Name: Masen Salvas MRN: [id number] Date of Birth: 02/17/1966  [facility]) CM/SW Contact  Karn Cassis, [place] Phone Number: 12/03/2020, 10:32 AM  Clinical Narrative: Discussed bed offer at [facility] with pt. Pt accepts. Facility notified and will start authorization after financial review. Pt will not require repeat COVID test as she states she has received both doses of vaccine. 30 day pasarr received.        Expected Discharge Plan: Skilled Nursing Facility Barriers to Discharge: Insurance Authorization  Expected Discharge Plan and Services Expected Discharge Plan: Skilled Nursing Facility       Living arrangements for the past 2 months: Single Family Home                                       Social Determinants of Health (SDOH) Interventions    Readmission Risk Interventions Readmission Risk Prevention Plan 12/02/2020  Transportation Screening Complete  Home Care Screening Complete  Medication Review (RN CM) Complete  Some recent data might be hidden

## 2020-12-03 NOTE — Progress Notes (Signed)
NAME:  Debra [name], MRN:  [id number], DOB:  30-May-1966, LOS: 10 ADMISSION DATE:  11/22/2020, CONSULTATION DATE:  12/30 REFERRING MD:  Tat/triad, CHIEF COMPLAINT:  resp distress    Brief History:  55 yo female smoker presented with fever (Temp 101.63F) and dyspnea x 3 days.  Associated with cough, fatigue, wheezing and headache.  Started on treatment for pneumonia and COPD exacerbation.  Slow to improve, and PCCM asked to assist with respiratory management.  Past Medical History:  Anxiety, HH, PUD, GERD, Depression, COPD, Low back pain, Arthritis   Significant Diagnostic Tests:   PFT 08/30/18 >> FEV1 1.47 (49%), FEV1% 49, TLC 7.08 (132%), DLCO 42%  CT angio chest 11/23/20 >> RLL consolidation with patchy GGO b/l  Micro Data:  COVID/flu 12/25 >> negative Blood 12/25 >> negative  MRSA PCR  12/26 > negative COVID/flu 12/16 >> negative Pneumococcal Ag 12/26 >> negative Legionella Ag 12/26 >> negative  Antimicrobials:  Doxycycline 12/25 >> 1/02 Cefepime 12/26 >> 1/02  Interim History / Subjective:  Easier to cough up phlegm.  Not having chest pain.  Objective   Blood pressure 136/86, pulse 100, temperature 98.4 F (36.9 C), temperature source Oral, resp. rate 18, height 5\' 7"  (1.702 m), weight 68.1 kg, SpO2 92 %.        Intake/Output Summary (Last 24 hours) at 12/03/2020 1409 Last data filed at 12/03/2020 1100 Gross per 24 hour  Intake 360 ml  Output --  Net 360 ml   Filed Weights   11/24/20 1820 11/25/20 0500 12/02/20 0449  Weight: 75 kg 75 kg 68.1 kg    Examination:  General - alert Eyes - pupils reactive ENT - no sinus tenderness, no stridor Cardiac - regular rate/rhythm, no murmur Chest - equal breath sounds b/l, no wheezing or rales Abdomen - soft, non tender, + bowel sounds Extremities - no cyanosis, clubbing, or edema Skin - no rashes Neuro - normal strength, moves extremities, follows commands Psych - normal mood and behavior   Resolved Hospital Problem  list   Community acquired pneumonia  Assessment & Plan:   Acute on chronic hypoxic respiratory failure. - goal SpO2 90 to 95% - baseline O2 need 4 liters  COPD exacerbation. - continue brovana, pulmicort, yupelri - transitioned to prednisone on 1/05 - can transition back to spiriva respimat and dulera once she has recovered further - prn albuterol  4 mm Lt upper lobe nodule on CT chest 02/07/20. - no progression on CT chest 11/23/20 - will need f/u CT chest w/o contrast in December 2022  Tobacco abuse. - nicotine patch  Thrombocytosis. - per primary team  Anxiety, chronic pain. - per primary team  Deconditioning. - encouraged her to participate with physical therapy - planning transfer to SNF when she gets insurance approval  Will arrange for outpt pulmonary follow up in 4 weeks in [place] office.  D/w Dr. Garrison  Best practice:  Diet: heart healthy DVT prophylaxis: lovenox GI prophylaxis: protonix Mobility: as tolerated Goals of care: full code; wouldn't want long term vent support Disposition: medical floor bed  Labs    CMP Latest Ref Rng & Units 12/01/2020 11/30/2020 11/29/2020  Glucose 70 - 99 mg/dL 01/27/2021) - 656(C)  BUN 6 - 20 mg/dL 15 - 15  Creatinine 127(N - 1.00 mg/dL 1.70 0.17) 4.94(W)  Sodium 135 - 145 mmol/L 139 - 139  Potassium 3.5 - 5.1 mmol/L 3.7 - 3.9  Chloride 98 - 111 mmol/L 101 - 99  CO2 22 - 32 mmol/L  26 - 29  Calcium 8.9 - 10.3 mg/dL 8.9(Q) - 8.7(L)  Total Protein 6.5 - 8.1 g/dL - - -  Total Bilirubin 0.3 - 1.2 mg/dL - - -  Alkaline Phos 38 - 126 U/L - - -  AST 15 - 41 U/L - - -  ALT 0 - 44 U/L - - -    CBC Latest Ref Rng & Units 12/02/2020 12/01/2020 11/29/2020  WBC 4.0 - 10.5 K/uL 14.9(H) 15.4(H) 9.1  Hemoglobin 12.0 - 15.0 g/dL 11.9 41.7 11.4(L)  Hematocrit 36.0 - 46.0 % 43.7 42.6 37.8  Platelets 150 - 400 K/uL 646(H) 607(H) 619(H)    ABG    Component Value Date/Time   PHART 7.501 (H) 11/27/2020 1610   PCO2ART 35.8 11/27/2020 1610    PO2ART 74.3 (L) 11/27/2020 1610   HCO3 28.7 (H) 11/27/2020 1610   TCO2 30 07/25/2020 1525   O2SAT 96.1 11/27/2020 1610   Signature:  Coralyn Helling, MD [facility] Pulmonary/Critical Care Pager - [phone] 12/03/2020, 2:09 PM

## 2020-12-03 NOTE — Progress Notes (Signed)
Nutrition Follow-up  DOCUMENTATION CODES:   Not applicable  INTERVENTION:  D/c Ensure supplement - pt does not like  Magic cup BID with meals, each supplement provides 290 kcal and 9 grams of protein (chocolate)  Continue MVI with minerals daily  Food preferences updated in [employer]  NUTRITION DIAGNOSIS:   Increased nutrient needs related to acute illness,chronic illness (acute respiratory failure secondary to pneumonia; COPD) as evidenced by estimated needs. -ongoing  GOAL:   Patient will meet greater than or equal to 90% of their needs -progressing  MONITOR:   Labs,I & O's,Supplement acceptance,PO intake,Weight trends  REASON FOR ASSESSMENT:   Consult Assessment of nutrition requirement/status  ASSESSMENT:   55 year old female admitted with acute respiratory failure with hypoxia secondary to pneumonia. Past medical history significant of COPD on 3 L oxygen at home, GERD, HLD, hiatal hernia, tobacco abuse, chronic back pain, arthritis, depression and anxiety.  Pt laying in bed with all lights off, says she is trying to rest this morning at RD visit. She reports ongoing poor appetite, recalls eating most of her potatoes for breakfast, she does not like eggs. Per flowsheet she has been consuming 10-85% of meals (46% average x 7 documented intakes 1/01-1/05) Pt states she is normally not a big eater, likes bananas, creamed potatoes with gravy, chicken, bacon, mac and cheese, bacon. RD educated on the importance of adequate nutrition, encouraged po intake of meals and supplements. Patient reports she does not like milk and is not drinking Ensure, however she does like ice cream and is agreeable to chocolate Magic Cup on meal trays.   I/Os: +1266 ml since admit Unsure of accuracy of weights this admission. Bed wt on 1/04 was 68.1 kg (149.82 lbs), suspect prior weights stated. On 12/25 pt weighed 72.1 kg (158.62 lbs), then on 12/27 as well as on 12/28 pt weighed 75 kg (165  lbs)  Medications reviewed and include: Acyclovir, Celexa, Mucinex, MVI, Remeron, Klor-con, Prednisone, Lyrica  Labs reviewed  Diet Order:   Diet Order            Diet Heart Room service appropriate? Yes; Fluid consistency: Thin  Diet effective now                 EDUCATION NEEDS:   No education needs have been identified at this time  Skin:  Skin Assessment: Skin Integrity Issues: Skin Integrity Issues:: Other (Comment) Other: ecchymosis; left hip  Last BM:  1/5 type 6  Height:   Ht Readings from Last 1 Encounters:  11/24/20 _0  (1.702 m)    Weight:   Wt Readings from Last 1 Encounters:  12/02/20 68.1 kg     BMI:  Body mass index is 23.51 kg/m.  Estimated Nutritional Needs:   Kcal:  1900-2100  Protein:  100-120  Fluid:  > 1.9 L   Lajuan Lines, RD, LDN Clinical Nutrition After Hours/Weekend Pager # in [place]

## 2020-12-03 NOTE — Progress Notes (Signed)
PROGRESS NOTE  Debra [name] JEH:[id number] DOB: 1965/12/09 DOA: 11/22/2020 PCP: [name], MD   Brief History: 55 y.o.femalewith medical history significant ofanxiety, panic attacks, depression, osteoarthritis, chronic lower back pain, COPD/active smoker, GERD, history of hiatal hernia, history of bleeding ulcers, hypokalemia, history of pneumonia who is coming to the emergency department with complaints of fever and progressively worse dyspnea for the past 3 days.All other associated symptoms are occasionally productive of whitish sputum cough, fatigue, malaise, wheezing and headache. No travel history or sick contacts. No significant relief by medications at home. She has doubledher O2 requirement from 3 LPM to 6 LPM with some improvement. She mentions that she is supposed to use her oxygen at night, but for the past 3 days she has been using it during daytime as well.  ED Course:Initial vital signs were temperature101.5 F, pulse 118, respirations 22, BP 104/86 mmHg and O2 sat 87% on 6 LPM via nasal cannula. The patient is currently on 10 LPM via HFNC. He received acetaminophen at 1000 mg p.o. x1, a 1000 mL NS bolus, 8 mg of dexamethasone IVP x1 and doxycycline 100 mg IVPB every 12 hours was started.  Labwork: Coronavirus 2 and influenza PCR was negative. CBC showed a white count of 9.4 with 85% neutrophils, hemoglobin 10.0 g/dL and platelets 378. Lactic acid was normal. CMP showed normal sodium, chloride and CO2, but potassium level was 2.9 mmol/L. Glucose 121 and calcium 7.5 mg/dL. Renal function was normal. Total protein 6.3, albumin 3.0 g/dL. Alkaline phosphatase 130 units/L. AST, ALT and total bilirubin were normal.  12/04/19- -Remains Medically stable for transfer with oxygen if SNF bed is available  Assessment/Plan: 1)Acute on chronic Respiratory Failure with Hypoxia -due to pneumonia and COPD exacerbation -chronically on 4L at home PTA -12/26  CTA chest--no PE; RLL consolidation with bilateral patchy GGO -wean oxygen for saturation >90% -pulmonary consult appreciated-- Dr. [name] hypnotic medications if possible --Overall respiratory status improving slowly, oxygen requirement down to 4 L via nasal cannula from 11 L this admission -Patient still requires up to 6 L with activity but at rest requiring only 4 L via nasal cannula - repeat chest x-ray on 12/01/2019----showed improved aeration and resolving infiltrates Okay to transition from Solu-Medrol IV to p.o. prednisone  2)CAP/Lobar Pnuemonia --Completed cefepime and doxy--last dose on 12/01/2020 for a total of 9 days -Repeat chest x-ray on 11/30/2020--as noted above -Leukocytosis is probably secondary to steroids -Clinically patient is much better with improved oxygen requirement  3)COPD exacerbation -Overall improved, please see #1 and #2 above, -completed iv Solu-Medrol IV  -Started  p.o. prednisone on 12/03/20 -continue mucinex -IS, flutter valve -Oxygen requirement improved as above #1  4)Anxiety --- anxiety and panic attacks improving with adjustments of medication -continue home dose klonopin 5 mg q 6 hrs prn anxiety -continue celexa, c/n  BuSpar 10 mg 3 times daily -Continue Remeron 15 mg nightly as patient has anorexia, insomnia along with anxiety  5)Chronic pain syndrome -continue home dose percocet 7.5/325 and pregabalin -Patient is on benzos, avoid over aggressive escalation of opiates and benzo to avoid respiratory depression  6)Hyperlipidemia -noncompliant with statin -She declines statin therapy  7)Tobacco abuse -cessation discussed -nicoderm patch  8)--Generalized weakness and deconditioning----PT and OT eval appreciated - recommends SNF rehab -PTA pt lived alone and did very poorly, patient has significant limitations with mobility related ADLs- this patient needs to continue to be monitored in the hospital until a SNF bed is  obtained as  she is not safe to go home with her current physcical limitations  9)Herpes Simplex outbreak--- this is not unusual for patient,  -Treated with Valtrex last dose 12/02/20 -much improved upper lip lesions  Status is: Inpatient  Remains inpatient appropriate because: Awaiting transfer to SNF rehab   dispo: The patient is from:Home Anticipated d/c is to:SNF Anticipated d/c date is:1 days -Medically stable for transfer with oxygen if SNF bed is available   Family Communication:mother previously updated / I called and updated patient's son [name]--- [phone] , he plans to visit 12/02/2020  Consultants:PCCM--  Code Status: FULL   DVT Prophylaxis:  [place] Lovenox   Procedures: As Listed in Progress Note Above  Antibiotics: Cefepime 12/26>> 12/01/20 Doxy 12/26>>12/01/20   Subjective: - No shob at rest, some DOE persist No chest pains  Objective: Vitals:   12/03/20 0130 12/03/20 0444 12/03/20 0807 12/03/20 0815  BP: 120/76 128/63    Pulse: 76 66    Resp: 18 18    Temp: 98.1 F (36.7 C) 97.8 F (36.6 C)    TempSrc:      SpO2: 98% 93% 96% 96%  Weight:      Height:        Intake/Output Summary (Last 24 hours) at 12/03/2020 1234 Last data filed at 12/03/2020 1100 Gross per 24 hour  Intake 360 ml  Output --  Net 360 ml   Weight change:  Exam:   General:  Pt is alert, no conversational dyspnea   HEENT: No icterus, No thrush, No neck mass, [place]/AT  Nose- 4L/min  Cardiovascular: RRR, S1/S2, no rubs, no gallops  Respiratory: Improving air movement, no significant wheezing abdomen: Soft/+BS, non tender, non distended, no guarding  Extremities: No edema, No lymphangitis, No petechiae, No rashes, no synovitis  Psych-- alert oriented x3, anxious affect  Neuro--generalized weakness without new focal deficit   Data Reviewed: I have personally reviewed following labs and imaging studies Basic Metabolic Panel: Recent Labs   Lab 11/27/20 0525 11/28/20 0624 11/29/20 0422 11/30/20 0507 12/01/20 0254  NA 141 140 139  --  139  K 4.3 3.4* 3.9  --  3.7  CL 101 101 99  --  101  CO2 28 29 29   --  26  GLUCOSE 123* 92 138*  --  133*  BUN 12 16 15   --  15  CREATININE 0.39* 0.39* 0.41* 0.42* 0.48  CALCIUM 8.1* 8.5* 8.7*  --  8.7*  MG 2.2 2.2 2.1  --   --   PHOS  --   --  4.2  --   --    Liver Function Tests: Recent Labs  Lab 11/27/20 0525 11/28/20 0624  AST 24 17  ALT 27 26  ALKPHOS 91 97  BILITOT 0.7 0.6  PROT 5.8* 6.3*  ALBUMIN 2.6* 3.0*   No results for input(s): LIPASE, AMYLASE in the last 168 hours. No results for input(s): AMMONIA in the last 168 hours. Coagulation Profile: No results for input(s): INR, PROTIME in the last 168 hours. CBC: Recent Labs  Lab 11/27/20 0525 11/28/20 0624 11/29/20 0422 12/01/20 0254 12/02/20 0426  WBC 5.3 8.6 9.1 15.4* 14.9*  NEUTROABS 3.6 4.3  --   --   --   HGB 9.1* 10.3* 11.4* 12.7 12.9  HCT 30.1* 34.3* 37.8 42.6 43.7  MCV 91.2 90.5 90.2 93.6 91.8  PLT 331 473* 619* 607* 646*   Cardiac Enzymes: No results for input(s): CKTOTAL, CKMB, CKMBINDEX, TROPONINI in the last 168  hours. BNP: Invalid input(s): POCBNP CBG: No results for input(s): GLUCAP in the last 168 hours. HbA1C: No results for input(s): HGBA1C in the last 72 hours. Urine analysis:    Component Value Date/Time   COLORURINE YELLOW 07/12/2017 [place] 07/12/2017 1455   LABSPEC 1.005 07/12/2017 1455   PHURINE 8.0 07/12/2017 1455   GLUCOSEU NEGATIVE 07/12/2017 1455   HGBUR NEGATIVE 07/12/2017 1455   BILIRUBINUR NEGATIVE 07/12/2017 1455   KETONESUR 5 (A) 07/12/2017 1455   PROTEINUR NEGATIVE 07/12/2017 1455   NITRITE NEGATIVE 07/12/2017 1455   LEUKOCYTESUR NEGATIVE 07/12/2017 1455   Sepsis Labs: @LABRCNTIP (procalcitonin:4,lacticidven:4) ) No results found for this or any previous visit (from the past 240 hour(s)).   Scheduled Meds: . acyclovir cream   Topical QID   . arformoterol  15 mcg Nebulization BID  . budesonide (PULMICORT) nebulizer solution  0.5 mg Nebulization BID  . busPIRone  10 mg Oral TID  . Chlorhexidine Gluconate Cloth  6 each Topical Daily  . citalopram  40 mg Oral Daily  . enoxaparin (LOVENOX) injection  40 mg Subcutaneous Q24H  . feeding supplement  237 mL Oral BID BM  . guaiFENesin  1,200 mg Oral BID  . mirtazapine  15 mg Oral QHS  . multivitamin with minerals  1 tablet Oral Daily  . nicotine  21 mg Transdermal Daily  . oxymetazoline  1 spray Each Nare BID  . pantoprazole  40 mg Oral Daily  . potassium chloride  10 mEq Oral BID  . predniSONE  50 mg Oral Q breakfast  . pregabalin  200 mg Oral QHS  . revefenacin  175 mcg Nebulization Daily   Continuous Infusions:   Procedures/Studies: DG Chest 2 View  Result Date: 11/30/2020 CLINICAL DATA:  Worsening shortness of breath. EXAM: CHEST - 2 VIEW COMPARISON:  Chest x-ray 11/27/2020 FINDINGS: The cardiac silhouette, mediastinal and hilar contours are within normal limits. The lungs demonstrate stable underlying emphysematous changes. Improved lung aeration with resolving interstitial infiltrates. No pleural effusions or pneumothorax. The bony thorax is intact. IMPRESSION: Stable underlying emphysematous changes. Improved lung aeration with resolving infiltrates. Electronically Signed   By: Marijo Sanes M.D.   On: 11/30/2020 12:30   CT ANGIO CHEST PE W OR WO CONTRAST  Result Date: 11/23/2020 CLINICAL DATA:  Shortness of breath and left-sided chest pain with decreased oxygen saturation EXAM: CT ANGIOGRAPHY CHEST WITH CONTRAST TECHNIQUE: Multidetector CT imaging of the chest was performed using the standard protocol during bolus administration of intravenous contrast. Multiplanar CT image reconstructions and MIPs were obtained to evaluate the vascular anatomy. CONTRAST:  69mL OMNIPAQUE IOHEXOL 350 MG/ML SOLN COMPARISON:  Chest x-ray from the previous day. FINDINGS: Cardiovascular:  Thoracic aorta shows minimal atherosclerotic calcifications. No aneurysmal dilatation or dissection is noted. No cardiac enlargement is seen. The coronary arteries demonstrate mild calcification. Pulmonary artery is well visualized within normal branching pattern. No findings to suggest pulmonary emboli are noted. Mediastinum/Nodes: Thoracic inlet is within normal limits. Scattered hilar and subcarinal adenopathy is noted likely reactive in nature. The esophagus is within normal limits. Lungs/Pleura: Consolidation is noted in the right lower lobe as well as scattered predominately peripheral airspace opacities within both lungs. No sizable effusion is noted. No sizable parenchymal nodules are noted. Upper Abdomen: No acute abnormality noted. Musculoskeletal: No acute bony abnormality is noted Review of the MIP images confirms the above findings. IMPRESSION: Right lower lobe consolidation with patchy ground-glass airspace opacities bilaterally consistent with multifocal pneumonia. Correlate with COVID-19 testing. No  evidence of pulmonary emboli. Aortic Atherosclerosis (ICD10-I70.0). Electronically Signed   By: Alcide Clever M.D.   On: 11/23/2020 09:09   DG Chest Port 1 View  Result Date: 11/27/2020 CLINICAL DATA:  Hypoxemia.  Acute respiratory failure. EXAM: PORTABLE CHEST 1 VIEW COMPARISON:  One-view chest x-ray 11/25/2020 FINDINGS: Heart size is normal. Aeration at the lung bases is improving. Interstitial coarsening is again noted. IMPRESSION: Improving aeration at the lung bases bilaterally. Electronically Signed   By: Marin Roberts M.D.   On: 11/27/2020 16:03   DG CHEST PORT 1 VIEW  Result Date: 11/25/2020 CLINICAL DATA:  Shortness of breath.  History of COPD. EXAM: PORTABLE CHEST 1 VIEW COMPARISON:  Single-view of the chest 11/22/2020. PA and lateral chest 08/30/2018. CT chest 11/23/2020. FINDINGS: The lungs are emphysematous. Patchy bilateral airspace disease most compatible with pneumonia has  progressed with new opacities seen in the upper lung zones bilaterally. No pneumothorax or pleural fluid. Heart size is normal. Aortic atherosclerosis. IMPRESSION: Patchy bilateral airspace disease compatible with plain film pneumonia has worsened since the most recent plain film comparison. Aortic Atherosclerosis (ICD10-I70.0) and Emphysema (ICD10-J43.9). Electronically Signed   By: Drusilla Kanner M.D.   On: 11/25/2020 10:23   DG Chest Portable 1 View  Result Date: 11/22/2020 CLINICAL DATA:  Shortness of breath, COPD. EXAM: PORTABLE CHEST 1 VIEW COMPARISON:  Chest x-ray 11/12/2019, CT chest 02/07/2020 FINDINGS: The heart size and mediastinal contours are within normal limits. Hyperinflation of the lungs consistent with emphysematous changes. Interval increase in interstitial markings diffusely but more prominent within the lower lobes. Hazy patchy bilateral lower lobe hazy airspace opacities. No pleural effusion. No pneumothorax. No acute osseous abnormality. IMPRESSION: Increased interstitial markings and hazy airspace opacities more prominent within the lower lobes. Findings likely represent infection/inflammation. COVID-19 infection not excluded. Followup PA and lateral chest X-ray is recommended in 3-4 weeks following therapy to ensure resolution and exclude underlying malignancy. Electronically Signed   By: Tish Frederickson M.D.   On: 11/22/2020 21:40   Ziare Orrick,MD  [facility]  If 7PM-7AM, please contact night-coverage [email or web address] Password [email or web address] 12/03/2020, 12:34 PM   LOS: 10 days

## 2020-12-04 MED ORDER — OXYCODONE-ACETAMINOPHEN 7.5-325 MG PO TABS
1.0000 | ORAL_TABLET | Freq: Three times a day (TID) | ORAL | 0 refills | Status: DC | PRN
Start: 2020-12-04 — End: 2022-04-10

## 2020-12-04 MED ORDER — PREDNISONE 20 MG PO TABS: 40.0000 mg | ORAL_TABLET | Freq: Every day | ORAL | 0 refills | Status: DC

## 2020-12-04 MED ORDER — LORAZEPAM 2 MG/ML IJ SOLN: 0.5000 mg | Freq: Once | INTRAMUSCULAR | Status: DC

## 2020-12-04 MED ORDER — POTASSIUM CHLORIDE CRYS ER 10 MEQ PO TBCR: 10.0000 meq | EXTENDED_RELEASE_TABLET | Freq: Two times a day (BID) | ORAL | 0 refills | Status: DC

## 2020-12-04 MED ORDER — BENZONATATE 200 MG PO CAPS: 200.0000 mg | ORAL_CAPSULE | Freq: Three times a day (TID) | ORAL | 0 refills | Status: DC | PRN

## 2020-12-04 MED ORDER — LORAZEPAM 1 MG PO TABS
1.0000 mg | ORAL_TABLET | Freq: Once | ORAL | Status: AC
  Administered 2020-12-04: 1 mg via ORAL
  Filled 2020-12-04: qty 1

## 2020-12-04 MED ORDER — NICOTINE 21 MG/24HR TD PT24: 21.0000 mg | MEDICATED_PATCH | Freq: Every day | TRANSDERMAL | 0 refills | Status: DC

## 2020-12-04 MED ORDER — GUAIFENESIN ER 600 MG PO TB12: 1200.0000 mg | ORAL_TABLET | Freq: Two times a day (BID) | ORAL | 0 refills | Status: DC

## 2020-12-04 MED ORDER — ACETAMINOPHEN 325 MG PO TABS: 650.0000 mg | ORAL_TABLET | Freq: Four times a day (QID) | ORAL | 0 refills | Status: DC | PRN

## 2020-12-04 MED ORDER — DIAZEPAM 5 MG PO TABS: 5.0000 mg | ORAL_TABLET | Freq: Two times a day (BID) | ORAL | 0 refills | Status: DC | PRN

## 2020-12-04 MED ORDER — BUSPIRONE HCL 10 MG PO TABS: 10.0000 mg | ORAL_TABLET | Freq: Three times a day (TID) | ORAL | 3 refills | Status: DC

## 2020-12-04 NOTE — Progress Notes (Signed)
Patient stated to RN and I that she did not want vitals done so she could rest until day shift arrives.

## 2020-12-04 NOTE — Discharge Summary (Addendum)
Debra [name], is a 55 y.o. female  DOB August 15, 1966  MRN [id number].  Admission date:  11/22/2020  Admitting Physician  Murlean Iba, MD  Discharge Date:  12/05/2020   Primary MD  Vidal Schwalbe, MD  Recommendations for primary care physician for things to follow:   1)You need oxygen at home at 3 L via nasal cannula continuously while awake and while asleep--- smoking or having open fires around oxygen can cause fire, significant injury and death  2)Avoid ibuprofen/Advil/Aleve/Motrin/Goody Powders/Naproxen/BC powders/Meloxicam/Diclofenac/Indomethacin and other Nonsteroidal anti-inflammatory medications as these will make you more likely to bleed and can cause stomach ulcers, can also cause Kidney problems.   3)Smoking cessation strongly advised--- please use nicotine patch to help you quit smoking.  4)Follow up with Pulmonologist Dr Melvyn Novas in about 2 weeks --Pulmonologist in [place]-  -Address: [address], [place],  [address] Phone: [phone] OR call [phone] -Patient needs to be seen in the [place] office, Not in [place]  Admission Diagnosis  Acute respiratory failure with hypoxia ([place]) [J96.01] Acute on chronic respiratory failure with hypoxia ([place]) [J96.21] Community acquired pneumonia, unspecified laterality [J18.9]   Discharge Diagnosis  Acute respiratory failure with hypoxia ([place]) [J96.01] Acute on chronic respiratory failure with hypoxia ([place]) [J96.21] Community acquired pneumonia, unspecified laterality [J18.9]    Principal Problem:   Acute respiratory failure with hypoxia ([place]) Active Problems:   GERD (gastroesophageal reflux disease)   Hypokalemia   Mild protein malnutrition (HCC)   Normocytic anemia   Hyperlipidemia   COPD with acute exacerbation ([place])   Tobacco use   CAP (community acquired pneumonia)   Acute on chronic respiratory failure with hypoxia  ([place])   Lobar pneumonia ([place])      Past Medical History:  Diagnosis Date  . Anxiety    panic attack  . Arthritis    "lower back" (06/07/2018)- osteo  . Chronic lower back pain   . Complication of anesthesia    anesthesia for neck surgery - had confusion/forgetfulness  . COPD (chronic obstructive pulmonary disease) ([place])   . Depression   . Dyspnea    with exertion  . GERD (gastroesophageal reflux disease)   . History of bleeding ulcers 1994  . History of hiatal hernia   . History of low potassium   . Pneumonia 2013    Past Surgical History:  Procedure Laterality Date  . ANTERIOR CERVICAL DECOMP/DISCECTOMY FUSION N/A 05/18/2017   Procedure: ACDF - C6-C7;  Surgeon: Ashok Pall, MD;  Location: [facility];  Service: Neurosurgery;  Laterality: N/A;  . ANTERIOR LUMBAR FUSION  2013   "L-6"  . ESOPHAGOGASTRODUODENOSCOPY N/A 07/14/2017   Procedure: ESOPHAGOGASTRODUODENOSCOPY (EGD);  Surgeon: Ladene Artist, MD;  [facility] ENDOSCOPY;  Service: Endoscopy;  Laterality: N/A;  . POSTERIOR LUMBAR FUSION  06/07/2018   L5-S1  . TONSILLECTOMY    . UPPER GASTROINTESTINAL ENDOSCOPY  1994  . VAGINAL HYSTERECTOMY  1997     HPI  from the history and physical done on the day of admission:    Chief Complaint:  Fever and shortness of breath for the past 3 days.  HPI: Debra [name] is a 55 y.o. female with medical history significant of anxiety, panic attacks, depression, osteoarthritis, chronic lower back pain, COPD/active smoker, GERD, history of hiatal hernia, history of bleeding ulcers, hypokalemia, history of pneumonia who is coming to the emergency department with complaints of fever and progressively worse dyspnea for the past 3 days.  All other associated symptoms are occasionally productive of whitish sputum cough, fatigue, malaise, wheezing and headache.  No travel history or sick contacts.  No significant relief by medications at home.  She has doubled her O2 requirement from 3 LPM to 6 LPM  with some improvement.  She mentions that she is supposed to use her oxygen at night, but for the past 3 days she has been using it during daytime as well.  She denies hemoptysis, chest pain, palpitations, dizziness, diaphoresis, PND, orthopnea or recent pitting edema of the lower extremities.  She denies abdominal pain, nausea, vomiting, diarrhea, constipation, melena or hematochezia.  No dysuria, frequency or hematuria.  No polyuria, polydipsia, polyphagia or blurred vision.  ED Course: Initial vital signs were temperature 101.5 F, pulse 118, respirations 22, BP 104/86 mmHg and O2 sat 87% on 6 LPM via nasal cannula.  The patient is currently on 10 LPM via HFNC.  He received acetaminophen at 1000 mg p.o. x1, a 1000 mL NS bolus, 8 mg of dexamethasone IVP x1 and doxycycline 100 mg IVPB every 12 hours was started.  Labwork: Coronavirus 2 and influenza PCR was negative.  CBC showed a white count of 9.4 with 85% neutrophils, hemoglobin 10.0 g/dL and platelets [id number].  Lactic acid was normal.  CMP showed normal sodium, chloride and CO2, but potassium level was 2.9 mmol/L.  Glucose 121 and calcium 7.5 mg/dL.  Renal function was normal.  Total protein 6.3, albumin 3.0 g/dL.  Alkaline phosphatase 130 units/L.  AST, ALT and total bilirubin were normal.  Imaging: A one-view portable chest radiograph consistent with RLL airspace disease.  No radiology report available.  Please see image for further detail.   Hospital Course:     Assessment/Plan: 1)Acute on Chronic Respiratory Failure with Hypoxia -due to pneumonia and COPD exacerbation -chronically on 4L at home PTA -11/23/20- CTA chest--no PE; RLL consolidation with bilateral patchy GGO -wean oxygen for saturation >90% -pulmonary consult appreciated-- Dr. Jeni SallesWert-->minimize hypnotic medications if possible --Overall respiratory status improved,  oxygen requirement down to 4 L via nasal cannula from 11 L this admission -- repeat chest x-ray on  12/01/2019----showed improved aeration and resolving infiltrates -Treated with Solu-Medrol IV ---- Discharge on p.o. prednisone  2)CAP/Lobar Pnuemonia --Completed cefepime and doxy--last dose on 12/01/2020 for a total of 9 days -Repeat chest x-ray on 11/30/2020--as noted above -Leukocytosis is probably secondary to steroids -Clinically patient is much better with improved oxygen requirement  3)COPD Exacerbation -Overall improved, please see #1 and #2 above, -completed iv Solu-Medrol IV  -Started  p.o. prednisone on 12/03/20 -continue mucinex -IS, flutter valve -Oxygen requirement improved as above #1  4)Anxiety --- anxiety and panic attacks improving with adjustments of medication -continue home dose klonopin 5 mg q 6 hrs prn anxiety -continue celexa, c/n  BuSpar 10 mg 3 times daily -  5)Chronic pain syndrome -continue home dose percocet 7.5/325 and pregabalin -Patient is on benzos, avoid over aggressive escalation of opiates and benzo to avoid respiratory depression  6)Hyperlipidemia -noncompliant with statin -She declines statin therapy  7)Tobacco abuse -cessation discussed -nicoderm patch  8)--Generalized  weakness and deconditioning----PT and OT eval appreciated - recommends SNF rehab -PTA pt lived alone and did very poorly,patient has significant limitations with mobility related ADLs-this patient needs to continue to be monitored in the hospital until a SNF bed is obtained as she is not safe to go home with her current physcical limitations  9)Herpes Simplex outbreak--- this is not unusual for patient,  -Treated with Valtrex last dose 12/02/20 -much improved upper lip lesions  Dispo: The patient is from:Home Anticipated d/c is to:SNF -Medically stable for transfer to SNF with oxygen    Family Communication:mother previously updated / Previously updated son [name]--- [phone] ,  Consultants:PCCM--  Code Status: FULL   DVT  Prophylaxis: [place] Lovenox   Procedures: As Listed in Progress Note Above  Antibiotics: Cefepime 12/26>> 12/01/20 Doxy 12/26>>12/01/20  Discharge Condition: Stable  Follow UP   Contact information for follow-up providers    Nyoka Cowden, MD. Schedule an appointment as soon as possible for a visit in 2 week(s).   Specialty: Pulmonary Disease Contact information: [address] [place] [address] [phone]            Contact information for after-discharge care    Destination    [facility] .   Service: Skilled Nursing Contact information: [address] [place] [address] [phone]                   Consults obtained - PCCM  Diet and Activity recommendation:  As advised  Discharge Instructions    Discharge Instructions    Call MD for:  difficulty breathing, headache or visual disturbances   Complete by: As directed    Call MD for:  persistant dizziness or light-headedness   Complete by: As directed    Call MD for:  persistant nausea and vomiting   Complete by: As directed    Call MD for:  temperature >100.4   Complete by: As directed    Diet - low sodium heart healthy   Complete by: As directed    Discharge instructions   Complete by: As directed    1)You need oxygen at home at 4 L via nasal cannula continuously while awake and while asleep--- smoking or having open fires around oxygen can cause fire, significant injury and death  2)Avoid ibuprofen/Advil/Aleve/Motrin/Goody Powders/Naproxen/BC powders/Meloxicam/Diclofenac/Indomethacin and other Nonsteroidal anti-inflammatory medications as these will make you more likely to bleed and can cause stomach ulcers, can also cause Kidney problems.   3)Smoking cessation strongly advised--- please use nicotine patch to help you quit smoking.  4)Follow up with Pulmonologist Dr Sherene Sires in about 2 weeks --Pulmonologist in [place]-  -Address: [address] [place], [place] [address] Phone: [phone] OR call [phone] -Patient needs to be seen in the [place] office, Not in [facility]   Increase activity slowly   Complete by: As directed        Discharge Medications     Allergies as of 12/05/2020      Reactions   Cefaclor Diarrhea, Rash   hives   Sertraline    Suicidal thoughts, insomnia, nightmares   Statins Dermatitis   Multiple ecchymosis areas on all extremities.   Amoxicillin-pot Clavulanate Diarrhea   Has patient had a PCN reaction causing immediate rash, facial/tongue/throat swelling, SOB or lightheadedness with hypotension: No Has patient had a PCN reaction causing severe rash involving mucus membranes or skin necrosis: No Has patient had a PCN reaction that required hospitalization: No Has patient had  a PCN reaction occurring within the last 10 years: Yes If all of the above answers are "NO", then may proceed with Cephalosporin use.   Codeine Nausea And Vomiting   If pt takes a lot of this med   Latex Itching, Swelling   redness   Levaquin [levofloxacin] Swelling   Can take up to 500 mg a day   Nsaids    Bleeding ulcers   Ciprofloxacin Nausea Only, Rash   Room was spinning      Medication List    STOP taking these medications   polyethylene glycol 17 g packet Commonly known as: MIRALAX / GLYCOLAX   potassium chloride 10 MEQ tablet Commonly known as: KLOR-CON Replaced by: potassium chloride 10 MEQ tablet     TAKE these medications   acetaminophen 325 MG tablet Commonly known as: TYLENOL Take 2 tablets (650 mg total) by mouth every 6 (six) hours as needed for mild pain, fever or headache (or Fever >/= 101).   benzonatate 200 MG capsule Commonly known as: TESSALON Take 1 capsule (200 mg total) by mouth 3 (three) times daily as needed for cough.   busPIRone 10 MG tablet Commonly known as: BUSPAR Take 1 tablet (10 mg total) by mouth 3 (three) times daily.   citalopram 40 MG tablet Commonly known  as: CELEXA Take 40 mg by mouth daily.   diazepam 5 MG tablet Commonly known as: VALIUM Take 1 tablet (5 mg total) by mouth every 12 (twelve) hours as needed for anxiety or muscle spasms. What changed:   when to take this  reasons to take this   Dulera 200-5 MCG/ACT Aero Generic drug: mometasone-formoterol Inhale 2 puffs into the lungs 2 (two) times daily.   guaiFENesin 600 MG 12 hr tablet Commonly known as: MUCINEX Take 2 tablets (1,200 mg total) by mouth 2 (two) times daily.   nicotine 21 mg/24hr patch Commonly known as: NICODERM CQ - dosed in mg/24 hours Place 1 patch (21 mg total) onto the skin daily.   omeprazole 40 MG capsule Commonly known as: PRILOSEC Take 40 mg by mouth daily.   oxyCODONE-acetaminophen 7.5-325 MG tablet Commonly known as: PERCOCET Take 1 tablet by mouth every 8 (eight) hours as needed for moderate pain. What changed:   when to take this  reasons to take this   potassium chloride 10 MEQ tablet Commonly known as: KLOR-CON Take 1 tablet (10 mEq total) by mouth 2 (two) times daily. Replaces: potassium chloride 10 MEQ tablet   predniSONE 20 MG tablet Commonly known as: Deltasone Take 2 tablets (40 mg total) by mouth daily with breakfast.   pregabalin 100 MG capsule Commonly known as: LYRICA Take 200 mg by mouth at bedtime.   ProAir HFA 108 (90 Base) MCG/ACT inhaler Generic drug: albuterol Inhale 2 puffs into the lungs every 4 (four) hours as needed for wheezing or shortness of breath.   Spiriva Respimat 2.5 MCG/ACT Aers Generic drug: Tiotropium Bromide Monohydrate Inhale 2 puffs into the lungs daily.       Major procedures and Radiology Reports - PLEASE review detailed and final reports for all details, in brief -   DG Chest 2 View  Result Date: 11/30/2020 CLINICAL DATA:  Worsening shortness of breath. EXAM: CHEST - 2 VIEW COMPARISON:  Chest x-ray 11/27/2020 FINDINGS: The cardiac silhouette, mediastinal and hilar contours are within  normal limits. The lungs demonstrate stable underlying emphysematous changes. Improved lung aeration with resolving interstitial infiltrates. No pleural effusions or pneumothorax. The bony thorax is  intact. IMPRESSION: Stable underlying emphysematous changes. Improved lung aeration with resolving infiltrates. Electronically Signed   By: Rudie Meyer M.D.   On: 11/30/2020 12:30   CT ANGIO CHEST PE W OR WO CONTRAST  Result Date: 11/23/2020 CLINICAL DATA:  Shortness of breath and left-sided chest pain with decreased oxygen saturation EXAM: CT ANGIOGRAPHY CHEST WITH CONTRAST TECHNIQUE: Multidetector CT imaging of the chest was performed using the standard protocol during bolus administration of intravenous contrast. Multiplanar CT image reconstructions and MIPs were obtained to evaluate the vascular anatomy. CONTRAST:  47mL OMNIPAQUE IOHEXOL 350 MG/ML SOLN COMPARISON:  Chest x-ray from the previous day. FINDINGS: Cardiovascular: Thoracic aorta shows minimal atherosclerotic calcifications. No aneurysmal dilatation or dissection is noted. No cardiac enlargement is seen. The coronary arteries demonstrate mild calcification. Pulmonary artery is well visualized within normal branching pattern. No findings to suggest pulmonary emboli are noted. Mediastinum/Nodes: Thoracic inlet is within normal limits. Scattered hilar and subcarinal adenopathy is noted likely reactive in nature. The esophagus is within normal limits. Lungs/Pleura: Consolidation is noted in the right lower lobe as well as scattered predominately peripheral airspace opacities within both lungs. No sizable effusion is noted. No sizable parenchymal nodules are noted. Upper Abdomen: No acute abnormality noted. Musculoskeletal: No acute bony abnormality is noted Review of the MIP images confirms the above findings. IMPRESSION: Right lower lobe consolidation with patchy ground-glass airspace opacities bilaterally consistent with multifocal pneumonia. Correlate  with COVID-19 testing. No evidence of pulmonary emboli. Aortic Atherosclerosis (ICD10-I70.0). Electronically Signed   By: Alcide Clever M.D.   On: 11/23/2020 09:09   DG Chest Port 1 View  Result Date: 11/27/2020 CLINICAL DATA:  Hypoxemia.  Acute respiratory failure. EXAM: PORTABLE CHEST 1 VIEW COMPARISON:  One-view chest x-ray 11/25/2020 FINDINGS: Heart size is normal. Aeration at the lung bases is improving. Interstitial coarsening is again noted. IMPRESSION: Improving aeration at the lung bases bilaterally. Electronically Signed   By: Marin Roberts M.D.   On: 11/27/2020 16:03   DG CHEST PORT 1 VIEW  Result Date: 11/25/2020 CLINICAL DATA:  Shortness of breath.  History of COPD. EXAM: PORTABLE CHEST 1 VIEW COMPARISON:  Single-view of the chest 11/22/2020. PA and lateral chest 08/30/2018. CT chest 11/23/2020. FINDINGS: The lungs are emphysematous. Patchy bilateral airspace disease most compatible with pneumonia has progressed with new opacities seen in the upper lung zones bilaterally. No pneumothorax or pleural fluid. Heart size is normal. Aortic atherosclerosis. IMPRESSION: Patchy bilateral airspace disease compatible with plain film pneumonia has worsened since the most recent plain film comparison. Aortic Atherosclerosis (ICD10-I70.0) and Emphysema (ICD10-J43.9). Electronically Signed   By: Drusilla Kanner M.D.   On: 11/25/2020 10:23   DG Chest Portable 1 View  Result Date: 11/22/2020 CLINICAL DATA:  Shortness of breath, COPD. EXAM: PORTABLE CHEST 1 VIEW COMPARISON:  Chest x-ray 11/12/2019, CT chest 02/07/2020 FINDINGS: The heart size and mediastinal contours are within normal limits. Hyperinflation of the lungs consistent with emphysematous changes. Interval increase in interstitial markings diffusely but more prominent within the lower lobes. Hazy patchy bilateral lower lobe hazy airspace opacities. No pleural effusion. No pneumothorax. No acute osseous abnormality. IMPRESSION: Increased  interstitial markings and hazy airspace opacities more prominent within the lower lobes. Findings likely represent infection/inflammation. COVID-19 infection not excluded. Followup PA and lateral chest X-ray is recommended in 3-4 weeks following therapy to ensure resolution and exclude underlying malignancy. Electronically Signed   By: Tish Frederickson M.D.   On: 11/22/2020 21:40    Micro Results   No  results found for this or any previous visit (from the past 240 hour(s)).  Today   Subjective    Debra [name] today remains medically stable for discharge to SNF rehab No fever  Or chills   No Nausea, Vomiting or Diarrhea   Patient has been seen and examined prior to discharge---   patient seen and reevaluated on 12/05/2020, remains medically stable for discharge---- --ambulated around in the room and O2 sats 92 to 90% on 3 L post ablation at rest O2 sats was 95 to 96% -Patient had anxiety/panic attack when she was told that she was being discharged to rehab received p.o. lorazepam x1 dose -Okay to discharge to SNF rehab today 12/05/2020  --Patient was initially discharged to SNF rehab on 12/04/2020 however there was some difficulties with transportation to SNF rehab   Objective   Blood pressure 104/78, pulse 94, temperature (!) 97.4 F (36.3 C), resp. rate 17, height 5' (1.524 m), weight 68.1 kg, SpO2 92 %.   Intake/Output Summary (Last 24 hours) at 12/05/2020 0829 Last data filed at 12/04/2020 2139 Gross per 24 hour  Intake 1070 ml  Output 1100 ml  Net -30 ml    Exam Gen:- Awake Alert, no acute distress  HEENT:- [place].AT, No sclera icterus Nose-[place] 3 L/min Neck-Supple Neck,No JVD,.  Lungs-improved air movement, no wheezing CV- S1, S2 normal, regular Abd-  +ve B.Sounds, Abd Soft, No tenderness,    Extremity/Skin:- No  edema,   good pulses Psych-affect is much less anxious, oriented x3 Neuro-no new focal deficits, no tremors    Data Review   CBC w Diff:  Lab Results  Component  Value Date   WBC 14.9 (H) 12/02/2020   HGB 12.9 12/02/2020   HCT 43.7 12/02/2020   PLT 646 (H) 12/02/2020   LYMPHOPCT 31 11/28/2020   MONOPCT 14 11/28/2020   EOSPCT 0 11/28/2020   BASOPCT 0 11/28/2020    CMP:  Lab Results  Component Value Date   NA 139 12/01/2020   K 3.7 12/01/2020   CL 101 12/01/2020   CO2 26 12/01/2020   BUN 15 12/01/2020   CREATININE 0.48 12/01/2020   PROT 6.3 (L) 11/28/2020   ALBUMIN 3.0 (L) 11/28/2020   BILITOT 0.6 11/28/2020   ALKPHOS 97 11/28/2020   AST 17 11/28/2020   ALT 26 11/28/2020  .  Total Discharge time is about 33 minutes  Shon Hale M.D on 12/05/2020 at 8:29 AM  Go to [email or web address] -  for contact info  [facility] - Office  [phone]

## 2020-12-04 NOTE — TOC Transition Note (Signed)
Transition of Care [facility]) - CM/SW Discharge Note   Patient Details  Name: Debra [name] MRN: [id number] Date of Birth: October 22, 1966  [facility] CM/SW Contact:  Elliot Gault, LCSW Phone Number: 12/04/2020, 2:46 PM   Clinical Narrative:     Pt stable for dc per MD and SNF has received insurance auth. Pt can transfer to [facility] today. She will go to room 302A. RN to call report. DC clinical sent electronically.   Updated pt and called pt's mother at pt request. EMS form printed to the floor. EMS called.   There are no other TOC needs for dc.   Final next level of care: Skilled Nursing Facility Barriers to Discharge: Barriers Resolved   Patient Goals and CMS Choice Patient states their goals for this hospitalization and ongoing recovery are:: to go to SNF the home. CMS Medicare.gov Compare Post Acute Care list provided to:: Patient Choice offered to / list presented to : Patient  Discharge Placement PASRR number recieved: 12/03/20            Patient chooses bed at: [facility] Patient to be transferred to facility by: EMS Name of family member notified: Sharline Patient and family notified of of transfer: 12/04/20  Discharge Plan and Services                                     Social Determinants of Health (SDOH) Interventions     Readmission Risk Interventions Readmission Risk Prevention Plan 12/02/2020  Transportation Screening Complete  Home Care Screening Complete  Medication Review (RN CM) Complete  Some recent data might be hidden

## 2020-12-04 NOTE — Plan of Care (Signed)
  Problem: Education: Goal: Knowledge of General Education information will improve Description: Including pain rating scale, medication(s)/side effects and non-pharmacologic comfort measures Outcome: Progressing   Problem: Health Behavior/Discharge Planning: Goal: Ability to manage health-related needs will improve Outcome: Progressing   Problem: Clinical Measurements: Goal: Ability to maintain clinical measurements within normal limits will improve Outcome: Progressing Goal: Will remain free from infection Outcome: Progressing   Problem: Activity: Goal: Risk for activity intolerance will decrease Outcome: Progressing   Problem: Respiratory: Goal: Ability to maintain adequate ventilation will improve Outcome: Progressing

## 2020-12-04 NOTE — Discharge Instructions (Signed)
1)You need oxygen at home at 4 L via nasal cannula continuously while awake and while asleep--- smoking or having open fires around oxygen can cause fire, significant injury and death  2)Avoid ibuprofen/Advil/Aleve/Motrin/Goody Powders/Naproxen/BC powders/Meloxicam/Diclofenac/Indomethacin and other Nonsteroidal anti-inflammatory medications as these will make you more likely to bleed and can cause stomach ulcers, can also cause Kidney problems.   3)Smoking cessation strongly advised--- please use nicotine patch to help you quit smoking.  4)Follow up with Pulmonologist Dr Sherene Sires in about 2 weeks --Pulmonologist in [place]-  -Address: [address], [place], [place] [address] Phone: [phone] OR call [phone] -Patient needs to be seen in the [place] office, Not in [place]

## 2020-12-05 MED ORDER — LORAZEPAM 1 MG PO TABS
1.0000 mg | ORAL_TABLET | Freq: Once | ORAL | Status: AC
  Administered 2020-12-05: 1 mg via ORAL
  Filled 2020-12-05: qty 1

## 2020-12-05 MED ORDER — ALPRAZOLAM 0.25 MG PO TABS: 0.2500 mg | ORAL_TABLET | ORAL | Status: DC

## 2020-12-05 NOTE — Progress Notes (Signed)
Nsg Discharge Note  Admit Date:  11/22/2020 Discharge date: 12/05/2020   Debra [name] to be D/C'd Home per MD order.  AVS completed.  Copy for chart, and copy for patient signed, and dated. Patient/caregiver able to verbalize understanding.  Discharge Medication: Allergies as of 12/05/2020      Reactions   Cefaclor Diarrhea, Rash   hives   Sertraline    Suicidal thoughts, insomnia, nightmares   Statins Dermatitis   Multiple ecchymosis areas on all extremities.   Amoxicillin-pot Clavulanate Diarrhea   Has patient had a PCN reaction causing immediate rash, facial/tongue/throat swelling, SOB or lightheadedness with hypotension: No Has patient had a PCN reaction causing severe rash involving mucus membranes or skin necrosis: No Has patient had a PCN reaction that required hospitalization: No Has patient had a PCN reaction occurring within the last 10 years: Yes If all of the above answers are "NO", then may proceed with Cephalosporin use.   Codeine Nausea And Vomiting   If pt takes a lot of this med   Latex Itching, Swelling   redness   Levaquin [levofloxacin] Swelling   Can take up to 500 mg a day   Nsaids    Bleeding ulcers   Ciprofloxacin Nausea Only, Rash   Room was spinning      Medication List    STOP taking these medications   polyethylene glycol 17 g packet Commonly known as: MIRALAX / GLYCOLAX   potassium chloride 10 MEQ tablet Commonly known as: KLOR-CON Replaced by: potassium chloride 10 MEQ tablet     TAKE these medications   acetaminophen 325 MG tablet Commonly known as: TYLENOL Take 2 tablets (650 mg total) by mouth every 6 (six) hours as needed for mild pain, fever or headache (or Fever >/= 101).   benzonatate 200 MG capsule Commonly known as: TESSALON Take 1 capsule (200 mg total) by mouth 3 (three) times daily as needed for cough.   busPIRone 10 MG tablet Commonly known as: BUSPAR Take 1 tablet (10 mg total) by mouth 3 (three) times daily.    citalopram 40 MG tablet Commonly known as: CELEXA Take 40 mg by mouth daily.   diazepam 5 MG tablet Commonly known as: VALIUM Take 1 tablet (5 mg total) by mouth every 12 (twelve) hours as needed for anxiety or muscle spasms. What changed:   when to take this  reasons to take this   Dulera 200-5 MCG/ACT Aero Generic drug: mometasone-formoterol Inhale 2 puffs into the lungs 2 (two) times daily.   guaiFENesin 600 MG 12 hr tablet Commonly known as: MUCINEX Take 2 tablets (1,200 mg total) by mouth 2 (two) times daily.   nicotine 21 mg/24hr patch Commonly known as: NICODERM CQ - dosed in mg/24 hours Place 1 patch (21 mg total) onto the skin daily.   omeprazole 40 MG capsule Commonly known as: PRILOSEC Take 40 mg by mouth daily.   oxyCODONE-acetaminophen 7.5-325 MG tablet Commonly known as: PERCOCET Take 1 tablet by mouth every 8 (eight) hours as needed for moderate pain. What changed:   when to take this  reasons to take this   potassium chloride 10 MEQ tablet Commonly known as: KLOR-CON Take 1 tablet (10 mEq total) by mouth 2 (two) times daily. Replaces: potassium chloride 10 MEQ tablet   predniSONE 20 MG tablet Commonly known as: Deltasone Take 2 tablets (40 mg total) by mouth daily with breakfast.   pregabalin 100 MG capsule Commonly known as: LYRICA Take 200 mg by mouth at  bedtime.   ProAir HFA 108 (90 Base) MCG/ACT inhaler Generic drug: albuterol Inhale 2 puffs into the lungs every 4 (four) hours as needed for wheezing or shortness of breath.   Spiriva Respimat 2.5 MCG/ACT Aers Generic drug: Tiotropium Bromide Monohydrate Inhale 2 puffs into the lungs daily.       Discharge Assessment: Vitals:   12/04/20 2042 12/04/20 2043  BP:    Pulse:    Resp:    Temp:    SpO2: 92% 92%   Skin clean, dry and intact without evidence of skin break down, no evidence of skin tears noted. IV catheter discontinued intact. Site without signs and symptoms of  complications - no redness or edema noted at insertion site, patient denies c/o pain - only slight tenderness at site.  Dressing with slight pressure applied.  D/c Instructions-Education: Discharge instructions given to patient/family with verbalized understanding. D/c education completed with patient/family including follow up instructions, medication list, d/c activities limitations if indicated, with other d/c instructions as indicated by MD - patient able to verbalize understanding, all questions fully answered. Patient instructed to return to ED, call 911, or call MD for any changes in condition.  Patient escorted via WC, and D/C home via private auto.  Brandy Hale, RN 12/05/2020 8:34 AM2

## 2020-12-05 NOTE — Progress Notes (Signed)
  Patient seen and evaluated, chart reviewed, please see EMR for updated orders. Please see full Discharge summary dictated by me on 12/04/20     Debra [name] today remains medically stable for discharge to SNF rehab No fever  Or chills   No Nausea, Vomiting or Diarrhea   Patient has been seen and examined prior to discharge---   patient seen and reevaluated on 12/05/2020, remains medically stable for discharge---- --ambulated around in the room and O2 sats 92 to 90% on 3 L post ablation at rest O2 sats was 95 to 96% -Patient had anxiety/panic attack when she was told that she was being discharged to rehab received p.o. lorazepam x1 dose -Okay to discharge to SNF rehab today 12/05/2020  --Patient was initially discharged to SNF rehab on 12/04/2020 however there was some difficulties with transportation to SNF rehab  Pt's Nurse at bedside  Shon Hale, MD

## 2020-12-10 NOTE — Telephone Encounter (Signed)
Dr. Tonia Brooms please advise if you are ok with patient switching to [place] office to be seen by Dr. Craige Cotta

## 2020-12-10 NOTE — Telephone Encounter (Signed)
Yes, I am completely fine with that Thanks Josephine Igo, DO [facility] 12/10/2020 5:57 PM

## 2020-12-11 NOTE — Telephone Encounter (Signed)
LMTCB for the pt and forwarding to front desk pool per protocol.

## 2020-12-11 NOTE — Telephone Encounter (Signed)
Only available opening for Dr. Craige Cotta in [place] is a 15 min slot on 2/3. The next 30 min opening is the week of 2/28.   Dr. Craige Cotta, please advise if ok to book in 15 min slot or push back till the week of 2/28 for 30 min slot. Thanks.

## 2020-12-11 NOTE — Telephone Encounter (Signed)
I know her from her hospitalization in January at [facility].  Okay for 15 minute slot with me.

## 2020-12-16 NOTE — Telephone Encounter (Signed)
Attempted to call pt to schedule f/u with [facility] in [place] - female answered and states we have the wrong number -pr

## 2020-12-17 ENCOUNTER — Other Ambulatory Visit: Payer: Self-pay | Admitting: *Deleted

## 2020-12-17 MED ORDER — SPIRIVA RESPIMAT 2.5 MCG/ACT IN AERS: 2.0000 | INHALATION_SPRAY | Freq: Every day | RESPIRATORY_TRACT | 3 refills | Status: DC

## 2020-12-17 NOTE — Telephone Encounter (Signed)
Attempted to call phone number listed under FYI tab [phone] that was made in Jan 2021 and was unable to reach patient or leave a message. Will try again at another time.

## 2020-12-24 NOTE — Telephone Encounter (Signed)
Attempted to call the phone number listed on FYI and phone had busy signal. Letter written due to multiple phone attempts to reach patient stating to please call to schedule follow up office visit. Letter printed and mailed out. Will send fyi to update Dr Craige Cotta.

## 2021-01-08 ENCOUNTER — Telehealth: Payer: Self-pay | Admitting: Pulmonary Disease

## 2021-01-08 NOTE — Telephone Encounter (Signed)
Attempted to contact patient as well. Information provided does not go to patient.

## 2021-01-08 NOTE — Telephone Encounter (Signed)
ATC pt with number listed and was told by a female this was the incorrect number, she states it was the "[place] [facility]". Called this number twice and was hung up on the second time. Will await for pt to call back.

## 2021-01-09 ENCOUNTER — Encounter: Payer: Self-pay | Admitting: *Deleted

## 2021-01-09 NOTE — Telephone Encounter (Signed)
I called the number provided and LMTCB  I am closing encounter since 3 attempts have been made to reach pt and we do not have her correct number according to documentation made below Called [phone] (H) which was in a previous msg and line rings fast busy phone  Letter mailed to the pt

## 2021-01-30 ENCOUNTER — Ambulatory Visit (INDEPENDENT_AMBULATORY_CARE_PROVIDER_SITE_OTHER): Payer: Medicaid Other | Admitting: Pulmonary Disease

## 2021-01-30 ENCOUNTER — Inpatient Hospital Stay: Payer: Medicaid Other | Admitting: Pulmonary Disease

## 2021-01-30 ENCOUNTER — Other Ambulatory Visit: Payer: Self-pay

## 2021-01-30 ENCOUNTER — Encounter: Payer: Self-pay | Admitting: Pulmonary Disease

## 2021-01-30 ENCOUNTER — Telehealth: Payer: Self-pay | Admitting: Pulmonary Disease

## 2021-01-30 DIAGNOSIS — J449 Chronic obstructive pulmonary disease, unspecified: Secondary | ICD-10-CM | POA: Diagnosis not present

## 2021-01-30 DIAGNOSIS — Z720 Tobacco use: Secondary | ICD-10-CM

## 2021-01-30 DIAGNOSIS — R911 Solitary pulmonary nodule: Secondary | ICD-10-CM | POA: Diagnosis not present

## 2021-01-30 DIAGNOSIS — IMO0001 Reserved for inherently not codable concepts without codable children: Secondary | ICD-10-CM

## 2021-01-30 NOTE — Progress Notes (Signed)
[facility]  Chief Complaint  Patient presents with  . Follow-up    Hospital follow up- pt was treated with levaquin (completed) and is currently on prednisone, still having shortness of breath, productive cough with yellow phlegm    Constitutional:  There were no vitals taken for this visit.  Deferred.  Past Medical History:  Anxiety, HH, PUD, GERD, Depression, COPD, Low back pain, Arthritis   Past Surgical History:  She  has a past surgical history that includes Tonsillectomy; Upper gastrointestinal endoscopy (1994); Anterior cervical decomp/discectomy fusion (N/A, 05/18/2017); Esophagogastroduodenoscopy (N/A, 07/14/2017); Posterior lumbar fusion (06/07/2018); Anterior lumbar fusion (2013); and Vaginal hysterectomy (1997).  Brief Summary:  Debra [name] is a 55 y.o. female smoker with COPD, lung nodule and chronic respiratory failure.      Subjective:  Virtual Visit via Telephone Note  I connected with Debra [name] on 01/30/21 at 10:30 AM EST by telephone and verified that I am speaking with the correct person using two identifiers.  Location: Patient: home Provider: medical office   I discussed the limitations, risks, security and privacy concerns of performing an evaluation and management service by telephone and the availability of in person appointments. I also discussed with the patient that there may be a patient responsible charge related to this service. The patient expressed understanding and agreed to proceed.   History of Present Illness:  Had trouble getting in tough with her previously.  Didn't have correct phone number.  She is using 4 liters oxygen.  Pulse ox at home above 90%.  She is slowly regaining her function - started driving again.  She had flare up of COPD.  Saw her PCP.  Completed levaquin and has few days of prednisone left.    Started smoking again.  Has been using nicotine patch.  Physical Exam:    Deferred.  Pulmonary testing:   PFT 08/30/18 >> FEV1 1.47 (49%), FEV1% 49, TLC 7.08 (132%), DLCO 42%  Chest Imaging:   CT chest 02/07/20 >> 4 mm nodule LUL, mild paraseptal and centrilobular emphysema  CT angio chest 11/23/20 >> RLL consolidation with patchy GGO b/l  Cardiac Tests:   Echo 07/13/17 >> EF 60 to 65%, grade 1 DD  Social History:  She  reports that she has been smoking cigarettes. She has a 17.50 pack-year smoking history. She has never used smokeless tobacco. She reports current alcohol use. She reports previous drug use.  Family History:  Her family history includes COPD in her mother; Heart attack in her paternal grandfather and paternal uncle; Heart disease in her father; Hypertension in her mother; Ulcers in her father.     Assessment/Plan:   COPD with emphysema and chronic bronchitis. - she is slowly recovering from most recent exacerbation - she is only able to tolerate spiriva and dulera; she reports other inhaler/nebulizer medications cause throat irritation and thrush - she is to complete course of prednisone from PCP - prn albuterol  Chronic respiratory failure with hypoxia. - she uses 4 liters 24/7  4 mm Lt upper lobe nodule on CT chest 02/07/20. - no progression on CT chest 11/23/20 - will need f/u CT chest w/o contrast in December 2022  Tobacco abuse. - she will try nicotine patch again  Time Spent Involved in Patient Care on Day of Examination:   I discussed the assessment and treatment plan with the patient. The patient was provided an opportunity to ask questions and all were answered. The patient agreed with  the plan and demonstrated an understanding of the instructions.   The patient was advised to call back or seek an in-person evaluation if the symptoms worsen or if the condition fails to improve as anticipated.  I provided 23 minutes of non-face-to-face time during this encounter.   Follow up:  There are no Patient Instructions  on file for this visit.  Medication List:   Allergies as of 01/30/2021      Reactions   Cefaclor Diarrhea, Rash   hives   Sertraline    Suicidal thoughts, insomnia, nightmares   Statins Dermatitis   Multiple ecchymosis areas on all extremities.   Amoxicillin-pot Clavulanate Diarrhea   Has patient had a PCN reaction causing immediate rash, facial/tongue/throat swelling, SOB or lightheadedness with hypotension: No Has patient had a PCN reaction causing severe rash involving mucus membranes or skin necrosis: No Has patient had a PCN reaction that required hospitalization: No Has patient had a PCN reaction occurring within the last 10 years: Yes If all of the above answers are "NO", then may proceed with Cephalosporin use.   Codeine Nausea And Vomiting   If pt takes a lot of this med   Latex Itching, Swelling   redness   Levaquin [levofloxacin] Swelling   Can take up to 500 mg a day   Nsaids    Bleeding ulcers   Ciprofloxacin Nausea Only, Rash   Room was spinning      Medication List       Accurate as of January 30, 2021 10:28 AM. If you have any questions, ask your nurse or doctor.        STOP taking these medications   benzonatate 200 MG capsule Commonly known as: TESSALON Stopped by: Coralyn Helling, MD     TAKE these medications   acetaminophen 325 MG tablet Commonly known as: TYLENOL Take 2 tablets (650 mg total) by mouth every 6 (six) hours as needed for mild pain, fever or headache (or Fever >/= 101).   busPIRone 10 MG tablet Commonly known as: BUSPAR Take 1 tablet (10 mg total) by mouth 3 (three) times daily.   citalopram 40 MG tablet Commonly known as: CELEXA Take 40 mg by mouth daily.   diazepam 5 MG tablet Commonly known as: VALIUM Take 1 tablet (5 mg total) by mouth every 12 (twelve) hours as needed for anxiety or muscle spasms.   Dulera 200-5 MCG/ACT Aero Generic drug: mometasone-formoterol Inhale 2 puffs into the lungs 2 (two) times daily.    guaiFENesin 600 MG 12 hr tablet Commonly known as: MUCINEX Take 2 tablets (1,200 mg total) by mouth 2 (two) times daily.   nicotine 21 mg/24hr patch Commonly known as: NICODERM CQ - dosed in mg/24 hours Place 1 patch (21 mg total) onto the skin daily.   omeprazole 40 MG capsule Commonly known as: PRILOSEC Take 40 mg by mouth daily as needed.   oxyCODONE-acetaminophen 7.5-325 MG tablet Commonly known as: PERCOCET Take 1 tablet by mouth every 8 (eight) hours as needed for moderate pain.   potassium chloride 10 MEQ tablet Commonly known as: KLOR-CON Take 1 tablet (10 mEq total) by mouth 2 (two) times daily.   predniSONE 20 MG tablet Commonly known as: Deltasone Take 2 tablets (40 mg total) by mouth daily with breakfast.   pregabalin 100 MG capsule Commonly known as: LYRICA Take 200 mg by mouth at bedtime.   ProAir HFA 108 (90 Base) MCG/ACT inhaler Generic drug: albuterol Inhale 2 puffs into the lungs every  4 (four) hours as needed for wheezing or shortness of breath.   Spiriva Respimat 2.5 MCG/ACT Aers Generic drug: Tiotropium Bromide Monohydrate Inhale 2 puffs into the lungs daily.       Signature:  Coralyn Helling, MD [facility] Pager - [phone] 01/30/2021, 10:28 AM

## 2021-01-30 NOTE — Patient Instructions (Signed)
Follow up in 3 months

## 2021-01-30 NOTE — Telephone Encounter (Signed)
I called and spoke with pt and she stated that she hated to cancel her appt but she woke up in the middle of the night and had a 103.8 fever and diarrhea.  She stated that she got her covid booster shot on 01/28/2021. She stated that she has been taking tylenol 1000 mg  She did get sick after getting the 2nd shot as well She had her antibodies checked and stated these were through the roof high She has been working hard with PT She said that VS told her in the hospital that she had end stage COPD and she wanted him to clarify what that means.  She is still giving out of breath with exertion She is still using her oxygen but trying to only use it at night.   I have placed the pt back on the schedule at 1030 as she had so many questions.  She is aware that this will be a televisit.

## 2021-03-02 ENCOUNTER — Telehealth: Payer: Self-pay | Admitting: Pulmonary Disease

## 2021-03-02 DIAGNOSIS — J449 Chronic obstructive pulmonary disease, unspecified: Secondary | ICD-10-CM

## 2021-03-02 NOTE — Telephone Encounter (Signed)
I have called the pt and she is aware of VS recs.  She will call her dentist to find out who he uses and have her PCP send over the referral to that provider.  Nothing further is needed.

## 2021-03-02 NOTE — Telephone Encounter (Signed)
Spoke with the pt  She was last seen 01/30/21  She states that her breathing is not improving since then  She took a shower this morning and her pulse rate got up to 175  She states that this has been occurring for several months  She occ has CP "right where my heart is" but has not had this in a wk now  She is not coughing or wheezing or having any other new symptoms She has not been using her rescue inhaler  She is taking her dulera and spiriva  I offered her ov with Dr Sherene Sires in [place] for tomorrow, but she refused, stating that she does not have anything new to discuss  She wants to know if Dr Craige Cotta will refer her to [facility] lung transplant  Please advise, thank you

## 2021-03-02 NOTE — Telephone Encounter (Signed)
She needs to get a referral to cardiology to assess her tachycardia.

## 2021-03-02 NOTE — Telephone Encounter (Signed)
Okay to put order in for referral to [facility] to assess for lung transplant.

## 2021-03-02 NOTE — Telephone Encounter (Signed)
Called and spoke with Patient. Dr. Evlyn Courier recommendations given.  Referral to [facility] for transplant.   Patient stated her question was not answered. Patient stated her HR is 150-175 BPM. Patient is wanting to know if her HR will ever be normal or if because she is post covid, will she always have increased HR,  if there is anything that can be done, or does she need cardiac referral.  Message routed to Dr. Craige Cotta to advise on HR

## 2021-03-11 ENCOUNTER — Telehealth: Payer: Self-pay

## 2021-03-11 NOTE — Telephone Encounter (Signed)
NOTES ON FILE FROM THE [facility] [phone], SENT REFERRAL TO SCHEDULING

## 2021-03-11 NOTE — Telephone Encounter (Signed)
Referral notes sent from [facility], Phone #: [phone], Fax #: [phone]   Notes sent to scheduling

## 2021-04-06 ENCOUNTER — Other Ambulatory Visit: Payer: Self-pay | Admitting: Neurosurgery

## 2021-04-06 ENCOUNTER — Other Ambulatory Visit (HOSPITAL_COMMUNITY): Payer: Self-pay | Admitting: Neurosurgery

## 2021-04-06 DIAGNOSIS — M47817 Spondylosis without myelopathy or radiculopathy, lumbosacral region: Secondary | ICD-10-CM

## 2021-04-13 ENCOUNTER — Ambulatory Visit (HOSPITAL_COMMUNITY): Payer: Medicaid Other

## 2021-04-13 ENCOUNTER — Encounter (HOSPITAL_COMMUNITY): Payer: Self-pay

## 2021-04-13 ENCOUNTER — Ambulatory Visit (HOSPITAL_COMMUNITY): Admission: RE | Admit: 2021-04-13 | Payer: Medicaid Other | Source: Ambulatory Visit

## 2021-05-06 ENCOUNTER — Ambulatory Visit (INDEPENDENT_AMBULATORY_CARE_PROVIDER_SITE_OTHER): Payer: Medicaid Other | Admitting: Pulmonary Disease

## 2021-05-06 ENCOUNTER — Ambulatory Visit: Payer: Medicaid Other | Admitting: Pulmonary Disease

## 2021-05-06 ENCOUNTER — Telehealth: Payer: Self-pay | Admitting: Pulmonary Disease

## 2021-05-06 ENCOUNTER — Other Ambulatory Visit: Payer: Self-pay

## 2021-05-06 ENCOUNTER — Encounter: Payer: Self-pay | Admitting: Pulmonary Disease

## 2021-05-06 VITALS — HR 87 | Temp 97.9°F

## 2021-05-06 DIAGNOSIS — J449 Chronic obstructive pulmonary disease, unspecified: Secondary | ICD-10-CM

## 2021-05-06 DIAGNOSIS — R911 Solitary pulmonary nodule: Secondary | ICD-10-CM

## 2021-05-06 DIAGNOSIS — Z720 Tobacco use: Secondary | ICD-10-CM | POA: Diagnosis not present

## 2021-05-06 NOTE — Progress Notes (Signed)
[facility]  Chief Complaint  Patient presents with  . Follow-up    Has had difficulty breathing, body aches and chills since yesterday    Constitutional:  Pulse 87   Temp 97.9 F (36.6 C)   SpO2 92%   Past Medical History:  Anxiety, HH, PUD, GERD, Depression, COPD, Low back pain, Arthritis   Past Surgical History:  She  has a past surgical history that includes Tonsillectomy; Upper gastrointestinal endoscopy (1994); Anterior cervical decomp/discectomy fusion (N/A, 05/18/2017); Esophagogastroduodenoscopy (N/A, 07/14/2017); Posterior lumbar fusion (06/07/2018); Anterior lumbar fusion (2013); and Vaginal hysterectomy (1997).  Brief Summary:  Debra [name] is a 55 y.o. female smoker with COPD, lung nodule and chronic respiratory failure.      Subjective:  Virtual Visit via Telephone Note  I connected with Debra [name] on 05/06/21 at 11:15 AM EDT by telephone and verified that I am speaking with the correct person using two identifiers.  Location: Patient: home Provider: medical office   I discussed the limitations, risks, security and privacy concerns of performing an evaluation and management service by telephone and the availability of in person appointments. I also discussed with the patient that there may be a patient responsible charge related to this service. The patient expressed understanding and agreed to proceed.   History of Present Illness:  She called earlier this morning and asked to get appointment changed to televisit.  She has been having hot flashes and then getting chilled.  Her oxygen level was in the 80's on room air, but improved once she put her oxygen on.  She was told by therapy that she could take a break from oxygen during the day.  She gets winded and gets fast heart rate when she is doing activity.  She was seen by cardiology and was to have Echo done, but hasn't scheduled this yet.  She is still smoking.   She wasn't able to pick up nicotine patch, but plans to do so.   Physical Exam:  Deferred   Pulmonary testing:   PFT 08/30/18 >> FEV1 1.47 (49%), FEV1% 49, TLC 7.08 (132%), DLCO 42%  A1AT 08/30/18 >> 155, MM  Chest Imaging:   CT chest 02/07/20 >> 4 mm nodule LUL, mild paraseptal and centrilobular emphysema  CT angio chest 11/23/20 >> RLL consolidation with patchy GGO b/l  Cardiac Tests:   Echo 07/13/17 >> EF 60 to 65%, grade 1 DD  Social History:  She  reports that she has been smoking cigarettes. She has a 17.50 pack-year smoking history. She has never used smokeless tobacco. She reports current alcohol use. She reports previous drug use.  Family History:  Her family history includes COPD in her mother; Heart attack in her paternal grandfather and paternal uncle; Heart disease in her father; Hypertension in her mother; Ulcers in her father.     Assessment/Plan:   COPD with emphysema and chronic bronchitis. - continue spiriva and dulera with prn albuterol; she is intolerant of any other inhalers or nebulizer therapy due to throat irritation and thrush  Chronic respiratory failure with hypoxia. - discussed role of supplemental oxygen and when to use it - goal SpO2 > 90% - continue 4 liters oxygen with exertion and sleep - re-assess home oxygen needs at her next visit  4 mm Lt upper lobe nodule on CT chest 02/07/20. - no progression on CT chest 11/23/20 - will need f/u CT chest without contrast in December 2022  Tobacco abuse. - advised  her to try using nicotine patch  Tachycardia. - she was seen by Dr. Willene Hatchet with [facility] - advised her to call cardiology to schedule Echocardiogram  Hot flashes. - she plans to do COVID test; if negative, then I advised her to follow up with her PCP  Anxiety. - she ran out of klonopin and hasn't taken for past two days - discussed concern for withdrawal if she abruptly stops benzodiazepine medication and advised to  get this refilled  Time Spent Involved in Patient Care on Day of Examination:   I discussed the assessment and treatment plan with the patient. The patient was provided an opportunity to ask questions and all were answered. The patient agreed with the plan and demonstrated an understanding of the instructions.   The patient was advised to call back or seek an in-person evaluation if the symptoms worsen or if the condition fails to improve as anticipated.  I provided 40 minutes of non-face-to-face time during this encounter.   Follow up:  Patient Instructions  Follow up in 2 months   Medication List:   Allergies as of 05/06/2021      Reactions   Cefaclor Hives, Diarrhea, Rash   Sertraline    Suicidal thoughts, insomnia, nightmares   Statins Dermatitis, Rash   Multiple ecchymosis areas on all extremities.   Amoxicillin-pot Clavulanate Diarrhea   Has patient had a PCN reaction causing immediate rash, facial/tongue/throat swelling, SOB or lightheadedness with hypotension: No Has patient had a PCN reaction causing severe rash involving mucus membranes or skin necrosis: No Has patient had a PCN reaction that required hospitalization: No Has patient had a PCN reaction occurring within the last 10 years: Yes If all of the above answers are "NO", then may proceed with Cephalosporin use.   Buspar [buspirone] Nausea And Vomiting   Codeine Nausea And Vomiting   If pt takes a lot of this med   Latex Itching, Swelling   redness   Levaquin [levofloxacin] Swelling   Can take up to 500 mg a day   Nsaids    Bleeding ulcers   Ciprofloxacin Nausea Only, Rash   Room was spinning      Medication List       Accurate as of May 06, 2021 12:38 PM. If you have any questions, ask your nurse or doctor.        STOP taking these medications   diazepam 5 MG tablet Commonly known as: VALIUM Stopped by: Coralyn Helling, MD   nicotine 21 mg/24hr patch Commonly known as: NICODERM CQ - dosed in mg/24  hours Stopped by: Coralyn Helling, MD     TAKE these medications   acetaminophen 500 MG tablet Commonly known as: TYLENOL Take 500-1,000 mg by mouth every 6 (six) hours as needed for moderate pain. What changed: Another medication with the same name was removed. Continue taking this medication, and follow the directions you see here. Changed by: Coralyn Helling, MD   busPIRone 10 MG tablet Commonly known as: BUSPAR Take 1 tablet (10 mg total) by mouth 3 (three) times daily.   citalopram 40 MG tablet Commonly known as: CELEXA Take 40 mg by mouth daily.   clonazePAM 1 MG tablet Commonly known as: KLONOPIN Take 1 mg by mouth every 6 (six) hours as needed.   cyclobenzaprine 10 MG tablet Commonly known as: FLEXERIL Take 10 mg by mouth 3 (three) times daily as needed for muscle spasms.   Dulera 200-5 MCG/ACT Aero Generic drug: mometasone-formoterol Inhale 2  puffs into the lungs 2 (two) times daily.   guaiFENesin 600 MG 12 hr tablet Commonly known as: MUCINEX Take 2 tablets (1,200 mg total) by mouth 2 (two) times daily.   loratadine 10 MG tablet Commonly known as: CLARITIN Take 10 mg by mouth daily as needed for allergies.   omeprazole 40 MG capsule Commonly known as: PRILOSEC Take 40 mg by mouth daily.   oxyCODONE-acetaminophen 7.5-325 MG tablet Commonly known as: PERCOCET Take 1 tablet by mouth every 8 (eight) hours as needed for moderate pain. What changed: when to take this   potassium chloride 10 MEQ tablet Commonly known as: KLOR-CON Take 1 tablet (10 mEq total) by mouth 2 (two) times daily.   pregabalin 100 MG capsule Commonly known as: LYRICA Take 100-200 mg by mouth See admin instructions. Take 100 mg in the morning and 200 mg at night   ProAir HFA 108 (90 Base) MCG/ACT inhaler Generic drug: albuterol Inhale 2 puffs into the lungs every 4 (four) hours as needed for wheezing or shortness of breath.   Spiriva Respimat 2.5 MCG/ACT Aers Generic drug: Tiotropium  Bromide Monohydrate Inhale 2 puffs into the lungs daily.       Signature:  Coralyn Helling, MD [facility]/Critical Care Pager - [phone] 05/06/2021, 12:38 PM

## 2021-05-06 NOTE — Telephone Encounter (Signed)
Called and made patient aware of appointment being changed to televisit. Phone number confirmed to use for appointment. Patient expressed full understanding. Nothing further needed at this time.

## 2021-05-06 NOTE — Telephone Encounter (Signed)
Okay to change to televisit.  

## 2021-05-06 NOTE — Telephone Encounter (Signed)
Called and spoke with patient who states Patient has appt today but unable to breathe. Televisit? Pt states no fever but chills and sweating. Headache yesterday.Has home covid test but hasn't taken.  Patient states having issues with heart rate going up to 138-150 per patient which causes difficulty to breathe. Patient states O2 level was 95% on room air and only has the difficulty breathing when heart rate goes up. O2 is currently 84% on room air and heart rate 106. Advised patient to put O2 on, patient states she wears O2 at 4 Liters PRN and mainly wears at night up until yesterday where she wore it more during the day also. Patient O2 sat came up to 94% on  4 Liters O2 heart rate 95.  Patient states she is doing the dulera and spiriva inhalers as ordered. Patient now on Klonopin 1mg  every 6 PRN. Patient states she didn't take any yesterday due to being out and patient still has to pick it up at the pharmacy. Patient states the klonopin helps with the heart rate and takes it 3 or 4 times per day. Patient states the symptoms started yesterday.  Patient pharmacy is [facility] [address] [place].  Dr [place] please advise.

## 2021-05-06 NOTE — Patient Instructions (Signed)
Follow up in 2 months

## 2021-05-13 ENCOUNTER — Other Ambulatory Visit: Payer: Self-pay

## 2021-05-13 ENCOUNTER — Ambulatory Visit (HOSPITAL_COMMUNITY)
Admission: RE | Admit: 2021-05-13 | Discharge: 2021-05-13 | Disposition: A | Discharge status: Home / Self Care | Payer: Medicaid Other | Source: Ambulatory Visit | Attending: Neurosurgery | Admitting: Neurosurgery

## 2021-05-13 DIAGNOSIS — Z981 Arthrodesis status: Secondary | ICD-10-CM | POA: Diagnosis not present

## 2021-05-13 DIAGNOSIS — M4316 Spondylolisthesis, lumbar region: Secondary | ICD-10-CM | POA: Diagnosis not present

## 2021-05-13 DIAGNOSIS — M47817 Spondylosis without myelopathy or radiculopathy, lumbosacral region: Secondary | ICD-10-CM

## 2021-05-13 MED ORDER — IOHEXOL 300 MG/ML  SOLN
10.0000 mL | Freq: Once | INTRAMUSCULAR | Status: AC | PRN
  Administered 2021-05-13: 10 mL via INTRATHECAL

## 2021-05-13 MED ORDER — OXYCODONE HCL 5 MG PO TABS
5.0000 mg | ORAL_TABLET | ORAL | Status: DC | PRN
  Administered 2021-05-13: 10 mg via ORAL

## 2021-05-13 MED ORDER — OXYCODONE HCL 5 MG PO TABS
ORAL_TABLET | ORAL | Status: AC
  Filled 2021-05-13: qty 2

## 2021-05-13 MED ORDER — DIAZEPAM 5 MG PO TABS
ORAL_TABLET | ORAL | Status: AC
  Filled 2021-05-13: qty 2

## 2021-05-13 MED ORDER — DIAZEPAM 5 MG PO TABS
10.0000 mg | ORAL_TABLET | Freq: Once | ORAL | Status: AC
  Administered 2021-05-13: 10 mg via ORAL
  Filled 2021-05-13: qty 2

## 2021-05-13 MED ORDER — LIDOCAINE HCL (PF) 1 % IJ SOLN
5.0000 mL | Freq: Once | INTRAMUSCULAR | Status: AC
  Administered 2021-05-13: 5 mL via INTRADERMAL

## 2021-05-13 NOTE — Discharge Instructions (Signed)
Myelogram and Lumbar Puncture Discharge Instructions  Go home and rest quietly for the next 24 hours.  It is important to lie flat for the next 24 hours.  Get up only to go to the restroom.  You may lie in the bed or on a couch on your back, your stomach, your left side or your right side.  You may have one pillow under your head.  You may have pillows between your knees while you are on your side or under your knees while you are on your back.  DO NOT drive today.  Recline the seat as far back as it will go, while still wearing your seat belt, on the way home.  You may get up to go to the bathroom as needed.  You may sit up for 10 minutes to eat.  You may resume your normal diet and medications unless otherwise indicated.  The incidence of headache, nausea, or vomiting is about 5% (one in 20 patients).  If you develop a headache, lie flat and drink plenty of fluids until the headache goes away.  Caffeinated beverages may be helpful.  If you develop severe nausea and vomiting or a headache that does not go away with flat bed rest, call Dr. Sueanne Margarita office.  You may resume normal activities after your 24 hours of bed rest is over; however, do not exert yourself strongly or do any heavy lifting tomorrow.  Call your physician for a follow-up appointment.  The results of your myelogram will be sent directly to your physician by the following day.  Discharge instructions have been explained to the patient.  The patient, or the person responsible for the patient, fully understands these instructions.

## 2021-05-13 NOTE — Progress Notes (Signed)
Pt ambulated without difficulty or bleeding.   Discharged home with her son who will drive and stay with pt x 24 hrs. 

## 2021-05-13 NOTE — Op Note (Signed)
05/13/2021 Lumbar Myelogram  PATIENT:  Debra [name] is a 55 y.o. female with pain in the lower extremities bilaterally.   PRE-OPERATIVE DIAGNOSIS:  lumbago  POST-OPERATIVE DIAGNOSIS:  lumbago  PROCEDURE:  Lumbar Myelogram  SURGEON:  [name]  ANESTHESIA:   local LOCAL MEDICATIONS USED:  LIDOCAINE  Procedure Note: Debra [name] is a 55 y.o. female Was taken to the fluoroscopy suite and  positioned prone on the fluoroscopy table. Her back was prepared and draped in a sterile manner. I infiltrated 6 cc into the lumbar region. I then introduced a spinal needle into the thecal sac at the L4/5 interlaminar space. I infiltrated 10cc of Isovue 300 into the thecal sac. Fluoroscopy showed the needle and contrast in the thecal sac. Debra [name] tolerated the procedure well. she Will be taken to CT for evaluation.     PATIENT DISPOSITION:  Short Stay

## 2021-05-29 ENCOUNTER — Ambulatory Visit: Payer: Medicaid Other | Admitting: Interventional Cardiology

## 2021-07-03 ENCOUNTER — Encounter: Payer: Self-pay | Admitting: Pulmonary Disease

## 2021-07-03 ENCOUNTER — Ambulatory Visit: Payer: Medicaid Other | Admitting: Pulmonary Disease

## 2021-07-03 ENCOUNTER — Other Ambulatory Visit: Payer: Self-pay

## 2021-07-03 VITALS — BP 112/78 | HR 98 | Temp 99.1°F | Ht 67.0 in | Wt 163.0 lb

## 2021-07-03 DIAGNOSIS — J449 Chronic obstructive pulmonary disease, unspecified: Secondary | ICD-10-CM

## 2021-07-03 DIAGNOSIS — Z720 Tobacco use: Secondary | ICD-10-CM

## 2021-07-03 DIAGNOSIS — R911 Solitary pulmonary nodule: Secondary | ICD-10-CM

## 2021-07-03 DIAGNOSIS — IMO0001 Reserved for inherently not codable concepts without codable children: Secondary | ICD-10-CM

## 2021-07-03 MED ORDER — BREZTRI AEROSPHERE 160-9-4.8 MCG/ACT IN AERO: 2.0000 | INHALATION_SPRAY | Freq: Two times a day (BID) | RESPIRATORY_TRACT | 0 refills | Status: DC

## 2021-07-03 NOTE — Addendum Note (Signed)
Addended by: Katrinka Blazing R on: 07/03/2021 01:22 PM   Modules accepted: Orders

## 2021-07-03 NOTE — Progress Notes (Signed)
[facility]  Chief Complaint  Patient presents with   Follow-up    Patient is feeling god overall, no concerns. Wears 4 liters oxygen as needed during the day and at night.    Constitutional:  BP 112/78 (BP Location: Left Arm, Patient Position: Sitting, Cuff Size: Normal)   Pulse 98   Temp 99.1 F (37.3 C) (Oral)   Ht 5\' 7"  (1.702 m)   Wt 163 lb (73.9 kg)   SpO2 94%   BMI 25.53 kg/m   Past Medical History:  Anxiety, HH, PUD, GERD, Depression, COPD, Low back pain, Arthritis   Past Surgical History:  She  has a past surgical history that includes Tonsillectomy; Upper gastrointestinal endoscopy (1994); Anterior cervical decomp/discectomy fusion (N/A, 05/18/2017); Esophagogastroduodenoscopy (N/A, 07/14/2017); Posterior lumbar fusion (06/07/2018); Anterior lumbar fusion (2013); and Vaginal hysterectomy (1997).  Brief Summary:  Debra [name] is a 55 y.o. female smoker with COPD, lung nodule and chronic respiratory failure.      Subjective:   She has occasional cough with clear sputum.  Not having wheeze, or chest discomfort.  She wants to know if there is any other medicines that she could try.  She also prefers more natural remedies.  She has been using been using kratom and feels this helps with pain symptoms.  Physical Exam:   Appearance - well kempt   ENMT - no sinus tenderness, no oral exudate, no LAN, Mallampati 2 airway, no stridor  Respiratory - equal breath sounds bilaterally, no wheezing or rales  CV - s1s2 regular rate and rhythm, no murmurs  Ext - no clubbing, no edema  Skin - no rashes  Psych - normal mood and affect    Pulmonary testing:  PFT 08/30/18 >> FEV1 1.47 (49%), FEV1% 49, TLC 7.08 (132%), DLCO 42% A1AT 08/30/18 >> 155, MM  Chest Imaging:  CT chest 02/07/20 >> 4 mm nodule LUL, mild paraseptal and centrilobular emphysema CT angio chest 11/23/20 >> RLL consolidation with patchy GGO b/l  Cardiac Tests:   Echo 07/01/21 >> EF 55 to 60%  Social History:  She  reports that she has been smoking cigarettes. She has a 17.50 pack-year smoking history. She has never used smokeless tobacco. She reports current alcohol use. She reports previous drug use.  Family History:  Her family history includes COPD in her mother; Heart attack in her paternal grandfather and paternal uncle; Heart disease in her father; Hypertension in her mother; Ulcers in her father.     Assessment/Plan:   COPD with emphysema and chronic bronchitis. - will have her try sample of breztri with spacer in place of dulera and spiriva; she will call for refill of breztri if she feels this works better - dry powder inhalers caused throat irritation and thrush - prn albuterol - advised her she could try drinking black tea, using teaspoon of local honey, and try tumeric   Chronic respiratory failure with hypoxia. - she uses 2 to 4 liters oxygen with exertion and sleep - goal SpO2 > 90%   4 mm Lt upper lobe nodule on CT chest 02/07/20. - schedule CT chest without contrast for December 2022   Tobacco abuse. - reviewed options to assist with smoking cessation  Tachycardia. - followed by Dr. January 2023 with [facility]   Time Spent Involved in Patient Care on Day of Examination:  35 minutes  Follow up:   Patient Instructions  Will have you try Breztri sample two puffs in the  morning and two puffs in the evening, and rinse your mouth after each use.  Will arrange for spacer device to use with Breztri.  Don't use spiriva and dulera while using Breztri.  Call once the breztri sample is close to finishing and let us know if you would like refill on Breztri, or resume spiriva and dulera.  Follow up in 4 months.  Medication List:   Allergies as of 07/03/2021       Reactions   Cefaclor Hives, Diarrhea, Rash   Sertraline    Suicidal thoughts, insomnia, nightmares   Statins Dermatitis, Rash   Multiple ecchymosis areas  on all extremities.   Amoxicillin-pot Clavulanate Diarrhea   Has patient had a PCN reaction causing immediate rash, facial/tongue/throat swelling, SOB or lightheadedness with hypotension: No Has patient had a PCN reaction causing severe rash involving mucus membranes or skin necrosis: No Has patient had a PCN reaction that required hospitalization: No Has patient had a PCN reaction occurring within the last 10 years: Yes If all of the above answers are "NO", then may proceed with Cephalosporin use.   Buspar [buspirone] Nausea And Vomiting   Codeine Nausea And Vomiting   If pt takes a lot of this med   Latex Itching, Swelling   redness   Levaquin [levofloxacin] Swelling   Can take up to 500 mg a day   Nsaids    Bleeding ulcers   Ciprofloxacin Nausea Only, Rash   Room was spinning        Medication List        Accurate as of July 03, 2021  1:08 PM. If you have any questions, ask your nurse or doctor.          STOP taking these medications    busPIRone 10 MG tablet Commonly known as: BUSPAR Stopped by: Coralyn Helling, MD       TAKE these medications    acetaminophen 500 MG tablet Commonly known as: TYLENOL Take 500-1,000 mg by mouth every 6 (six) hours as needed for moderate pain.   citalopram 40 MG tablet Commonly known as: CELEXA Take 40 mg by mouth daily.   clonazePAM 1 MG tablet Commonly known as: KLONOPIN Take 1 mg by mouth 4 (four) times daily.   cyclobenzaprine 10 MG tablet Commonly known as: FLEXERIL Take 10 mg by mouth 3 (three) times daily as needed for muscle spasms.   Dulera 200-5 MCG/ACT Aero Generic drug: mometasone-formoterol Inhale 2 puffs into the lungs 2 (two) times daily.   guaiFENesin 600 MG 12 hr tablet Commonly known as: MUCINEX Take 2 tablets (1,200 mg total) by mouth 2 (two) times daily.   loratadine 10 MG tablet Commonly known as: CLARITIN Take 10 mg by mouth daily as needed for allergies.   omeprazole 40 MG capsule Commonly  known as: PRILOSEC Take 40 mg by mouth daily.   oxyCODONE-acetaminophen 7.5-325 MG tablet Commonly known as: PERCOCET Take 1 tablet by mouth every 8 (eight) hours as needed for moderate pain. What changed:  when to take this additional instructions   potassium chloride 10 MEQ tablet Commonly known as: KLOR-CON Take 1 tablet (10 mEq total) by mouth 2 (two) times daily.   pregabalin 100 MG capsule Commonly known as: LYRICA Take 100-200 mg by mouth See admin instructions. Take 100 mg in the morning and 200 mg in the evening   ProAir HFA 108 (90 Base) MCG/ACT inhaler Generic drug: albuterol Inhale 2 puffs into the lungs every 4 (four) hours as needed  for wheezing or shortness of breath.   Spiriva Respimat 2.5 MCG/ACT Aers Generic drug: Tiotropium Bromide Monohydrate Inhale 2 puffs into the lungs daily.        Signature:  Coralyn Helling, MD [facility] Pulmonary/Critical Care Pager - [phone] 07/03/2021, 1:08 PM

## 2021-07-03 NOTE — Patient Instructions (Signed)
Will have you try Breztri sample two puffs in the morning and two puffs in the evening, and rinse your mouth after each use.  Will arrange for spacer device to use with Breztri.  Don't use spiriva and dulera while using Breztri.  Call once the breztri sample is close to finishing and let us know if you would like refill on Breztri, or resume spiriva and dulera.  Follow up in 4 months.

## 2021-07-07 ENCOUNTER — Telehealth: Payer: Self-pay | Admitting: Pulmonary Disease

## 2021-07-07 MED ORDER — DULERA 200-5 MCG/ACT IN AERO
2.0000 | INHALATION_SPRAY | Freq: Two times a day (BID) | RESPIRATORY_TRACT | 3 refills | Status: DC
Start: 2021-07-07 — End: 2021-09-11

## 2021-07-07 MED ORDER — SPIRIVA RESPIMAT 2.5 MCG/ACT IN AERS: 2.0000 | INHALATION_SPRAY | Freq: Every day | RESPIRATORY_TRACT | 3 refills | Status: DC

## 2021-07-07 MED ORDER — NYSTATIN 100000 UNIT/ML MT SUSP: 5.0000 mL | Freq: Four times a day (QID) | OROMUCOSAL | 0 refills | Status: AC

## 2021-07-07 NOTE — Telephone Encounter (Signed)
Okay to send refill for spiriva respimat 2.5 two puffs daily, and dulera 100 two puffs bid.  Cancel breztri.  Can send script for nystatin 100,000 units/ml, 5 ml swish and swallow qid x 5 days.

## 2021-07-07 NOTE — Telephone Encounter (Signed)
Called and spoke with patient. She verbalized understanding. RXs have been sent to the pharmacy for her. She is aware to call us back if she does not notice any improvement.   Nothing further needed at time of call.

## 2021-07-07 NOTE — Telephone Encounter (Signed)
Call returned to patient, confirmed DOB. Patient states she brushes her teeth and uses baking soda after using the inhaler every time. She states this happens with any new inhaler she tries. She does not feel the breztri made a big difference. She reports having a white coating on her tongue and some throat soreness. She reports the dulera and spiriva work great and would like to go back to using those.  VS please advise if we can send in nystatin solution for thrush? Also patient is wanting to go back to dulera and spiriva, will send refills if okay? Thanks :)

## 2021-07-08 ENCOUNTER — Telehealth: Payer: Self-pay | Admitting: Pulmonary Disease

## 2021-07-08 MED ORDER — PREDNISONE 10 MG PO TABS: ORAL_TABLET | ORAL | 0 refills | Status: DC

## 2021-07-08 NOTE — Telephone Encounter (Signed)
Can send script for prednisone 10 mg pills >> 3 pills daily for 2 days, 2 pills daily for 2 days, 1 pill daily for 2 days.  If her symptoms don't improve or if her blood sugars aren't able to be controlled while on prednisone, then she would need to be admitted to hospital.

## 2021-07-08 NOTE — Telephone Encounter (Signed)
Spoke with the pt  She is c/o increased SOB x 2 days  She states that she gets winded with just walking 10 ft  She states no increased cough or wheezing, f/c/s, aches  She is requesting pred rx "it's the only thing that opens me up" She did take at home covid test this am and it was neg  She also wants to tell Dr Craige Cotta she thinks that her herbal med she takes for pain is causing her SOB so she stopped it (red [place]) She is back on dulera and spiriva since thrush from breztri  Please advise thanks!  Allergies  Allergen Reactions   Cefaclor Hives, Diarrhea and Rash   Sertraline     Suicidal thoughts, insomnia, nightmares   Statins Dermatitis and Rash    Multiple ecchymosis areas on all extremities.   Amoxicillin-Pot Clavulanate Diarrhea    Has patient had a PCN reaction causing immediate rash, facial/tongue/throat swelling, SOB or lightheadedness with hypotension: No Has patient had a PCN reaction causing severe rash involving mucus membranes or skin necrosis: No Has patient had a PCN reaction that required hospitalization: No Has patient had a PCN reaction occurring within the last 10 years: Yes If all of the above answers are "NO", then may proceed with Cephalosporin use.    Buspar [Buspirone] Nausea And Vomiting   Codeine Nausea And Vomiting    If pt takes a lot of this med   Latex Itching and Swelling    redness   Levaquin [Levofloxacin] Swelling    Can take up to 500 mg a day   Nsaids     Bleeding ulcers   Ciprofloxacin Nausea Only and Rash    Room was spinning

## 2021-07-08 NOTE — Telephone Encounter (Signed)
Called and spoke to pt. Informed her of the recs per Dr. Sood. Rx sent to preferred pharmacy. Pt verbalized understanding and denied any further questions or concerns at this time.   

## 2021-07-27 ENCOUNTER — Telehealth: Payer: Self-pay | Admitting: Pulmonary Disease

## 2021-07-27 NOTE — Telephone Encounter (Signed)
I have attempted to call the pt but the VM has not been set up yet.  Will need to try back later.

## 2021-07-28 NOTE — Telephone Encounter (Signed)
Spoke to patient and relayed below message.  Patient stated that she has a lot of appt scheduled.  She would like to hold off on referral for now. She will call back with she is ready to proceed. Nothing further needed at this.

## 2021-07-28 NOTE — Telephone Encounter (Signed)
Called and spoke to patient.  Procedure is zephyr valve.  Patient is aware that we will contact her with Dr. Evlyn Courier recommendations.

## 2021-07-28 NOTE — Telephone Encounter (Signed)
Spoke with the pt She states she has heard of a non invasive procedure that [facility] offers that "clears lung ducts"   She is interested in this and would like referral if appropriate  She also states having more cough and sputum production since the last visit 07/03/21  She went back to dulera and spiriva bc she states that the [place] made her more SOB  Her cough is prod with large amounts of yellow sputum  She had been taking mucinex but stopped after a month bc she read that you should not take more than 2 wks  She has not smoked in 2 days  She is asking what to do about cough Please advise, thanks

## 2021-07-28 NOTE — Telephone Encounter (Signed)
The name of the procedure is Zepher Valve. Patient phone number is [phone].

## 2021-07-28 NOTE — Telephone Encounter (Signed)
This procedure is performed at [facility].  Can arrange for referral to [facility] if she is interested in learning more about this.

## 2021-08-31 ENCOUNTER — Telehealth: Payer: Self-pay | Admitting: Pulmonary Disease

## 2021-08-31 MED ORDER — PREDNISONE 10 MG PO TABS: ORAL_TABLET | ORAL | 0 refills | Status: DC

## 2021-08-31 NOTE — Telephone Encounter (Signed)
I have called the pt and she stated that she has been having a hard time catching her breath the last 5 days.  She stated that she has appt the end of November and she stated that she cannot wait that long.  She is wanting to get an rx for prednisone.  I did offer her appt this week but she stated that she did not want to come to [place] for an appt she only wanted to see VS in [place] in November.    She stated that she has a hard time getting all of her inhaler dose in and taking a deep breath in.  She stated that the last time this happened she was given prednisone and this helps.  VS please advise. thanks

## 2021-08-31 NOTE — Telephone Encounter (Signed)
Please send script for prednisone 10 mg pill >> 3 pills daily for 2 days, 2 pills daily for 2 days, 1 pill daily for 2 days.  If not improved with this, then she will need ROV which can be a video/tele visit if something can be schedule in [place].

## 2021-08-31 NOTE — Telephone Encounter (Signed)
Called and spoke with pt and she is aware of VS recs and that the prednisone taper has been sent to her pharmacy.  She is aware to call if this does not help and we will get her appt .  Pt voiced her understanding.

## 2021-08-31 NOTE — Telephone Encounter (Signed)
Pt states she has noticed her breathing has changed, Pt has gotten hr under control with cardiologist. Pt gets very short of breath walking short distances, or taking shower, etc.Pt feels her lungs have "closed up" like last time she saw VS. May need prednisone. Please advise [phone]

## 2021-09-09 ENCOUNTER — Telehealth: Payer: Self-pay

## 2021-09-09 NOTE — Telephone Encounter (Signed)
NOTES SCANNED TO REFERRAL 

## 2021-09-11 ENCOUNTER — Telehealth: Payer: Self-pay | Admitting: Pulmonary Disease

## 2021-09-11 MED ORDER — SPIRIVA RESPIMAT 2.5 MCG/ACT IN AERS: 2.0000 | INHALATION_SPRAY | Freq: Every day | RESPIRATORY_TRACT | 3 refills | Status: DC

## 2021-09-11 MED ORDER — DULERA 200-5 MCG/ACT IN AERO: 2.0000 | INHALATION_SPRAY | Freq: Two times a day (BID) | RESPIRATORY_TRACT | 3 refills | Status: DC

## 2021-09-11 NOTE — Telephone Encounter (Signed)
Refills have been sent to the pharmacy and nothing further is needed.  

## 2021-09-15 NOTE — Addendum Note (Signed)
Addended by: Marcellus Scott on: 09/15/2021 12:25 PM   Modules accepted: Orders

## 2021-09-15 NOTE — Telephone Encounter (Signed)
I have attempted to call the pharmacy but was on hold for over 15 mins.  Will try back

## 2021-09-16 NOTE — Telephone Encounter (Signed)
I have called the pharmacy and they stated that they are out of the dulera and it may be on the truck that came in today.  She said to have the pt call back after lunch to check on that one.  She does have the spiriva and they are going to fill that for the pt now.    I called and spoke with the pt and she is aware of what the pharmacy said.  She will reach back out to them today.  Nothing further is needed.

## 2021-10-05 ENCOUNTER — Encounter (INDEPENDENT_AMBULATORY_CARE_PROVIDER_SITE_OTHER): Payer: Self-pay | Admitting: *Deleted

## 2021-10-12 ENCOUNTER — Encounter (INDEPENDENT_AMBULATORY_CARE_PROVIDER_SITE_OTHER): Payer: Self-pay

## 2021-10-12 ENCOUNTER — Telehealth: Payer: Self-pay | Admitting: Pulmonary Disease

## 2021-10-12 NOTE — Telephone Encounter (Signed)
Pt very sick-chest,lungs,cough and sinus infection.Tested negative for flu and covid- WBC 19.2 -on second abx first z pack now Levaquin. Not getting better. Wants to know if VS can do televisit/mychart visit instead. Pt was going to reschedule in Dec after ct, but VS's first avail is Jan 23.  Dr. Craige Cotta please advise if it is okay to change appt to video visit?

## 2021-10-12 NOTE — Telephone Encounter (Signed)
Called and spoke to patient. She only wants to see Dr. Craige Cotta and states she isnt comfortable with seeing an NP unless she has to. She is scheduled for this Friday 11/18 at 12:00 in [place] office. Patient wants to know if she can keep her Friday appt and do a video visit instead.   Dr. Craige Cotta please advise if this is okay with you.

## 2021-10-12 NOTE — Telephone Encounter (Signed)
Pt very sick-chest,lungs,cough and sinus infection.Tested negative for flu and covid- WBC 19.2 -on second abx first z pack now Levaquin. Not getting better. Wants to know if VS can do televisit/mychart visit instead. Pt was going to reschedule in Dec after ct, but VS's first avail is Jan 23.Please advise [phone]

## 2021-10-12 NOTE — Telephone Encounter (Signed)
Okay to do video visit with me tomorrow or NP this afternoon.

## 2021-10-12 NOTE — Telephone Encounter (Signed)
That's fine

## 2021-10-13 ENCOUNTER — Telehealth: Payer: Self-pay

## 2021-10-13 NOTE — Telephone Encounter (Signed)
Patient was called and changed appt for Friday from in office to [facility], Papers mailed to patient on instructions for my chart video visit.

## 2021-10-13 NOTE — Telephone Encounter (Signed)
Called patient because yesterday she had wanted to change her appt on Friday to a mychart video visit. Upon looking at Fridays schedule noted that patient does not have mychart. Patient states she is feeling better today and thinks she can do an in person visit on Friday. Changed visit. She will call if anything changes between now and Friday.

## 2021-10-16 ENCOUNTER — Ambulatory Visit (INDEPENDENT_AMBULATORY_CARE_PROVIDER_SITE_OTHER): Payer: Medicaid Other | Admitting: Pulmonary Disease

## 2021-10-16 ENCOUNTER — Encounter: Payer: Self-pay | Admitting: Pulmonary Disease

## 2021-10-16 ENCOUNTER — Other Ambulatory Visit: Payer: Self-pay

## 2021-10-16 VITALS — BP 128/82 | HR 80 | Temp 98.6°F | Ht 70.0 in | Wt 168.0 lb

## 2021-10-16 DIAGNOSIS — J449 Chronic obstructive pulmonary disease, unspecified: Secondary | ICD-10-CM | POA: Diagnosis not present

## 2021-10-16 DIAGNOSIS — Z720 Tobacco use: Secondary | ICD-10-CM | POA: Diagnosis not present

## 2021-10-16 DIAGNOSIS — R911 Solitary pulmonary nodule: Secondary | ICD-10-CM

## 2021-10-16 NOTE — Patient Instructions (Signed)
Your follow up CT chest scan is scheduled for November 18, 2021 - will call you with results  Follow up in 3 months

## 2021-10-16 NOTE — Progress Notes (Signed)
[facility]  Chief Complaint  Patient presents with   Follow-up    Cough has improved since last ov. Yellow mucus.     Past Surgical History:  She  has a past surgical history that includes Tonsillectomy; Upper gastrointestinal endoscopy (1994); Anterior cervical decomp/discectomy fusion (N/A, 05/18/2017); Esophagogastroduodenoscopy (N/A, 07/14/2017); Posterior lumbar fusion (06/07/2018); Anterior lumbar fusion (2013); and Vaginal hysterectomy (1997).  Past Medical History:  Anxiety, HH, PUD, GERD, Depression, COPD, Low back pain, Arthritis   Constitutional:  BP 128/82   Pulse 80   Temp 98.6 F (37 C)   Ht 5\' 10"  (1.778 m)   Wt 168 lb (76.2 kg)   SpO2 97%   BMI 24.11 kg/m   Brief Summary:  Debra [name] is a 55 y.o. female smoker with COPD, lung nodule and chronic respiratory failure.      Subjective:   She had trouble with her breathing in October and called in for a prednisone taper.  She was on a prednisone course in August also.  She had another flare up earlier this month.  Was getting sinus congestion, cough, wheeze, and phlegm.  Her PCP gave her course of zithromax and then levaquin, in addition to another course of prednisone.  She finished levaquin today.  She will finish prednisone over the weekend.  She is feeling better.    She is mad at herself for continuing to smoke, and is confident that she will be able to quit again.  Physical Exam:   Appearance - well kempt   ENMT - no sinus tenderness, no oral exudate, no LAN, Mallampati 2 airway, no stridor  Respiratory - equal breath sounds bilaterally, no wheezing or rales  CV - s1s2 regular rate and rhythm, no murmurs  Ext - no clubbing, no edema  Skin - no rashes  Psych - normal mood and affect     Pulmonary testing:  PFT 08/30/18 >> FEV1 1.47 (49%), FEV1% 49, TLC 7.08 (132%), DLCO 42% A1AT 08/30/18 >> 155, MM  Chest Imaging:  CT chest 02/07/20 >> 4 mm  nodule LUL, mild paraseptal and centrilobular emphysema CT angio chest 11/23/20 >> RLL consolidation with patchy GGO b/l  Cardiac Tests:  Echo 07/01/21 >> EF 55 to 60%  Social History:  She  reports that she has been smoking cigarettes. She has a 17.50 pack-year smoking history. She has never used smokeless tobacco. She reports current alcohol use. She reports that she does not currently use drugs.  Family History:  Her family history includes COPD in her mother; Heart attack in her paternal grandfather and paternal uncle; Heart disease in her father; Hypertension in her mother; Ulcers in her father.     Assessment/Plan:   COPD with emphysema and chronic bronchitis. - breztri and dry powder inhalers caused throat irritation and thrush - continue spiriva, and dulera - prn albuterol - complete course of prednisone from her PCP - advised her she could try drinking black tea, using teaspoon of local honey, and try tumeric   Chronic respiratory failure with hypoxia. - she uses 2 to 4 liters oxygen with exertion and sleep - goal SpO2 > 90% - reassess O2 needs at her next visit   4 mm Lt upper lobe nodule on CT chest 02/07/20. - she has follow up CT chest scheduled or 11/18/21   Tobacco abuse. - reviewed options to help with smoking cessation  Tachycardia. - followed by Dr. 11/20/21 with [facility]   Time  Spent Involved in Patient Care on Day of Examination:  33 minutes  Follow up:   Patient Instructions  Your follow up CT chest scan is scheduled for November 18, 2021 - will call you with results  Follow up in 3 months  Medication List:   Allergies as of 10/16/2021       Reactions   Cefaclor Hives, Diarrhea, Rash   Sertraline    Suicidal thoughts, insomnia, nightmares   Statins Dermatitis, Rash   Multiple ecchymosis areas on all extremities.   Amoxicillin-pot Clavulanate Diarrhea   Has patient had a PCN reaction causing immediate rash, facial/tongue/throat  swelling, SOB or lightheadedness with hypotension: No Has patient had a PCN reaction causing severe rash involving mucus membranes or skin necrosis: No Has patient had a PCN reaction that required hospitalization: No Has patient had a PCN reaction occurring within the last 10 years: Yes If all of the above answers are "NO", then may proceed with Cephalosporin use.   Buspar [buspirone] Nausea And Vomiting   Codeine Nausea And Vomiting   If pt takes a lot of this med   Latex Itching, Swelling   redness   Levaquin [levofloxacin] Swelling   Can take up to 500 mg a day   Nsaids    Bleeding ulcers   Ciprofloxacin Nausea Only, Rash   Room was spinning        Medication List        Accurate as of October 16, 2021  1:30 PM. If you have any questions, ask your nurse or doctor.          acetaminophen 500 MG tablet Commonly known as: TYLENOL Take 500-1,000 mg by mouth every 6 (six) hours as needed for moderate pain.   citalopram 40 MG tablet Commonly known as: CELEXA Take 40 mg by mouth daily.   clonazePAM 1 MG tablet Commonly known as: KLONOPIN Take 1 mg by mouth 4 (four) times daily.   cyclobenzaprine 10 MG tablet Commonly known as: FLEXERIL Take 10 mg by mouth 3 (three) times daily as needed for muscle spasms.   Dulera 200-5 MCG/ACT Aero Generic drug: mometasone-formoterol Inhale 2 puffs into the lungs 2 (two) times daily.   guaiFENesin 600 MG 12 hr tablet Commonly known as: MUCINEX Take 2 tablets (1,200 mg total) by mouth 2 (two) times daily.   loratadine 10 MG tablet Commonly known as: CLARITIN Take 10 mg by mouth daily as needed for allergies.   omeprazole 40 MG capsule Commonly known as: PRILOSEC Take 40 mg by mouth daily.   oxyCODONE-acetaminophen 7.5-325 MG tablet Commonly known as: PERCOCET Take 1 tablet by mouth every 8 (eight) hours as needed for moderate pain. What changed:  when to take this additional instructions   potassium chloride 10 MEQ  tablet Commonly known as: KLOR-CON Take 1 tablet (10 mEq total) by mouth 2 (two) times daily.   predniSONE 10 MG tablet Commonly known as: DELTASONE Take 3 tabs x 2 days, then take 2 tabs x 2 days, then take 1 tab x 2 days, then stop.   pregabalin 100 MG capsule Commonly known as: LYRICA Take 100-200 mg by mouth See admin instructions. Take 100 mg in the morning and 200 mg in the evening   ProAir HFA 108 (90 Base) MCG/ACT inhaler Generic drug: albuterol Inhale 2 puffs into the lungs every 4 (four) hours as needed for wheezing or shortness of breath.   Spiriva Respimat 2.5 MCG/ACT Aers Generic drug: Tiotropium Bromide Monohydrate Inhale 2 puffs  into the lungs daily.        Signature:  Coralyn Helling, MD [facility] Pager - [phone] 10/16/2021, 1:30 PM

## 2021-11-05 ENCOUNTER — Other Ambulatory Visit: Payer: Self-pay

## 2021-11-05 ENCOUNTER — Telehealth: Payer: Self-pay | Admitting: Pulmonary Disease

## 2021-11-05 DIAGNOSIS — R0902 Hypoxemia: Secondary | ICD-10-CM

## 2021-11-05 NOTE — Telephone Encounter (Signed)
Called and spoke to patient. She was agreeable to try assessment from [facility]. Nothing further needed.

## 2021-11-05 NOTE — Telephone Encounter (Signed)
Called and talked to patient. She is wanting to know if Dr. Craige Cotta would let her do a 6 minute walk. States she currently has portable tanks with [facility] and is feeling that they are too heavy for her and harder to carry around. Would like to know if she can do a 6 minute walk to qualify for an inogen or if an order can be placed without the walk to [facility].   Dr. Craige Cotta please advise. Thanks!

## 2021-11-05 NOTE — Telephone Encounter (Signed)
Can place order for her to be assessed by [facility] for a POC or best fit for home oxygen set up.  Please let her know that since she already has oxygen set up through [facility] her insurance likely would not pay for a change to Inogen set up.

## 2021-11-05 NOTE — Progress Notes (Signed)
Patient called back and asked if we could send it to another [facility] since she is having some issues with management at the [facility] in [place]. Asked PCCs and they stated the order is sent to [facility] in [place] who then sends it to patients established site. Notified patient and offered number: [phone] to her to call. Patient states she will call the rep in [place] [facility] who helps her frequently. Nothing further needed at this time.

## 2021-11-17 ENCOUNTER — Telehealth: Payer: Self-pay | Admitting: Pulmonary Disease

## 2021-11-17 NOTE — Telephone Encounter (Signed)
Needs to get COVID and flu testing and schedule appointment with NP.

## 2021-11-17 NOTE — Telephone Encounter (Signed)
Primary Pulmonologist: Dr. Craige Cotta  Last office visit and with whom: 10-16-21 Dr. Craige Cotta                                      What do we see them for (pulmonary problems): copd  Last OV assessment/plan: see below   Was appointment offered to patient (explain)?  No, fever    Reason for call:  Patient called and said she has had cough with clear/ yellow mucus. Headache.  Runny nose.  Fever this morning at 9 am with 102.1 oral.  Diarrhea with blood in stool.  Vaxxed for Covid X4.  Flu vax last week. Has not taken any covid or flu tests.  Has taken tylenol.   Allergies  Allergen Reactions   Cefaclor Hives, Diarrhea and Rash   Sertraline     Suicidal thoughts, insomnia, nightmares   Statins Dermatitis and Rash    Multiple ecchymosis areas on all extremities.   Amoxicillin-Pot Clavulanate Diarrhea    Has patient had a PCN reaction causing immediate rash, facial/tongue/throat swelling, SOB or lightheadedness with hypotension: No Has patient had a PCN reaction causing severe rash involving mucus membranes or skin necrosis: No Has patient had a PCN reaction that required hospitalization: No Has patient had a PCN reaction occurring within the last 10 years: Yes If all of the above answers are "NO", then may proceed with Cephalosporin use.    Buspar [Buspirone] Nausea And Vomiting   Codeine Nausea And Vomiting    If pt takes a lot of this med   Latex Itching and Swelling    redness   Levaquin [Levofloxacin] Swelling    Can take up to 500 mg a day   Nsaids     Bleeding ulcers   Ciprofloxacin Nausea Only and Rash    Room was spinning    Immunization History  Administered Date(s) Administered   Moderna Sars-Covid-2 Vaccination 02/26/2020, 03/26/2020, 03/10/2021      Assessment/Plan:    COPD with emphysema and chronic bronchitis. - breztri and dry powder inhalers caused throat irritation and thrush - continue spiriva, and dulera - prn albuterol - complete course of prednisone from  her PCP - advised her she could try drinking black tea, using teaspoon of local honey, and try tumeric    Chronic respiratory failure with hypoxia. - she uses 2 to 4 liters oxygen with exertion and sleep - goal SpO2 > 90% - reassess O2 needs at her next visit   4 mm Lt upper lobe nodule on CT chest 02/07/20. - she has follow up CT chest scheduled or 11/18/21   Tobacco abuse. - reviewed options to help with smoking cessation   Tachycardia. - followed by Dr. Willene Hatchet with [facility]     Time Spent Involved in Patient Care on Day of Examination:  33 minutes   Follow up:    Patient Instructions  Your follow up CT chest scan is scheduled for November 18, 2021 - will call you with results   Follow up in 3 months   Medication List:    Allergies as of 10/16/2021         Reactions    Cefaclor Hives, Diarrhea, Rash    Sertraline      Suicidal thoughts, insomnia, nightmares    Statins Dermatitis, Rash    Multiple ecchymosis areas on all extremities.    Amoxicillin-pot Clavulanate Diarrhea  Has patient had a PCN reaction causing immediate rash, facial/tongue/throat swelling, SOB or lightheadedness with hypotension: No Has patient had a PCN reaction causing severe rash involving mucus membranes or skin necrosis: No Has patient had a PCN reaction that required hospitalization: No Has patient had a PCN reaction occurring within the last 10 years: Yes If all of the above answers are "NO", then may proceed with Cephalosporin use.    Buspar [buspirone] Nausea And Vomiting    Codeine Nausea And Vomiting    If pt takes a lot of this med    Latex Itching, Swelling    redness    Levaquin [levofloxacin] Swelling    Can take up to 500 mg a day    Nsaids      Bleeding ulcers    Ciprofloxacin Nausea Only, Rash    Room was spinning            Medication List           Accurate as of October 16, 2021  1:30 PM. If you have any questions, ask your nurse or doctor.               acetaminophen 500 MG tablet Commonly known as: TYLENOL Take 500-1,000 mg by mouth every 6 (six) hours as needed for moderate pain.    citalopram 40 MG tablet Commonly known as: CELEXA Take 40 mg by mouth daily.    clonazePAM 1 MG tablet Commonly known as: KLONOPIN Take 1 mg by mouth 4 (four) times daily.    cyclobenzaprine 10 MG tablet Commonly known as: FLEXERIL Take 10 mg by mouth 3 (three) times daily as needed for muscle spasms.    Dulera 200-5 MCG/ACT Aero Generic drug: mometasone-formoterol Inhale 2 puffs into the lungs 2 (two) times daily.    guaiFENesin 600 MG 12 hr tablet Commonly known as: MUCINEX Take 2 tablets (1,200 mg total) by mouth 2 (two) times daily.    loratadine 10 MG tablet Commonly known as: CLARITIN Take 10 mg by mouth daily as needed for allergies.    omeprazole 40 MG capsule Commonly known as: PRILOSEC Take 40 mg by mouth daily.    oxyCODONE-acetaminophen 7.5-325 MG tablet Commonly known as: PERCOCET Take 1 tablet by mouth every 8 (eight) hours as needed for moderate pain. What changed:  when to take this additional instructions    potassium chloride 10 MEQ tablet Commonly known as: KLOR-CON Take 1 tablet (10 mEq total) by mouth 2 (two) times daily.    predniSONE 10 MG tablet Commonly known as: DELTASONE Take 3 tabs x 2 days, then take 2 tabs x 2 days, then take 1 tab x 2 days, then stop.    pregabalin 100 MG capsule Commonly known as: LYRICA Take 100-200 mg by mouth See admin instructions. Take 100 mg in the morning and 200 mg in the evening    ProAir HFA 108 (90 Base) MCG/ACT inhaler Generic drug: albuterol Inhale 2 puffs into the lungs every 4 (four) hours as needed for wheezing or shortness of breath.    Spiriva Respimat 2.5 MCG/ACT Aers Generic drug: Tiotropium Bromide Monohydrate Inhale 2 puffs into the lungs daily.

## 2021-11-17 NOTE — Telephone Encounter (Signed)
Called and spoke with pt letting her know recs per Dr. Craige Cotta. While speaking with pt, she said that she was to sick to get up to go out anywhere to be tested for flu and covid. Pt also said that she currently did not have anyone avail at this time that could take her to be tested.  Stated to pt if she was too sick to drive herself to be flu/covid tested or didn't have anyone that could take her to be tested/evaluated that she might need to call EMS to get them to take her to the hospital to be evaluated. Pt said that she did not want to bother her son who was at work to get him to take her. Pt also said that she might see if her son's girlfriend might be able to take her as she was going to be bringing her some nyquil as well as a covid test. Stated to pt again that she needed to be evaluated and restated to her that if she did not have anyone that could take her that she might need to call EMS. Pt said that she has also put a call into her PCP and is waiting for a call from the. Pt said she was going to think about what she wanted to do. Stated to pt after she is flu/covid tested to call us to let us know the results and she verbalized understanding. Stated to her if results are negative that VS said we needed to schedule an appt with APP to be evaluated. Will keep encounter open to wait for results.

## 2021-11-17 NOTE — Telephone Encounter (Signed)
Patient called and is very sick- can barely move. Started last night with headache, cough, and runny nose. Has been with her mother in the hospital for 5 days. Has not taken a COVID test since she got this sick.  Please advise.

## 2021-11-18 ENCOUNTER — Telehealth: Payer: Medicaid Other | Admitting: Acute Care

## 2021-11-18 ENCOUNTER — Ambulatory Visit (HOSPITAL_COMMUNITY): Payer: Medicaid Other

## 2021-11-18 NOTE — Telephone Encounter (Signed)
Spoke with the pt  She states that she was able to take an at home covid test and it was neg  She states she is feeling too bad to go to UC for flu test  She declined appt here  She would like to try and do video visit  I have confirmed she does have a smart phone and she would like the link texted to her  I scheduled this visit for 11/18/21 at 4:30 with Dr Francine Graven  Advised to seek emergent care sooner if needed

## 2021-11-19 ENCOUNTER — Telehealth: Payer: Self-pay | Admitting: Pulmonary Disease

## 2021-11-19 NOTE — Telephone Encounter (Signed)
*  to add to last message*  Patient was requesting medication for her cough.

## 2021-11-19 NOTE — Telephone Encounter (Signed)
Primary Pulmonologist: Dr. Craige Cotta  Last office visit and with whom: 10-16-21 Dr. Craige Cotta  What do we see them for (pulmonary problems): COPD Last OV assessment/plan: see below   Was appointment offered to patient (explain)?  No. flu +  Reason for call:  Coughing up bright red blood  SOB  Fever last night of 102.1  Covid - Pt states she has not taken a flu test but believes she has the flu.  Had an appt with Dr. Francine Graven yesterday Patient is taking tamiflu Patient is unable to get out of bed.    Allergies  Allergen Reactions   Cefaclor Hives, Diarrhea and Rash   Sertraline     Suicidal thoughts, insomnia, nightmares   Statins Dermatitis and Rash    Multiple ecchymosis areas on all extremities.   Amoxicillin-Pot Clavulanate Diarrhea    Has patient had a PCN reaction causing immediate rash, facial/tongue/throat swelling, SOB or lightheadedness with hypotension: No Has patient had a PCN reaction causing severe rash involving mucus membranes or skin necrosis: No Has patient had a PCN reaction that required hospitalization: No Has patient had a PCN reaction occurring within the last 10 years: Yes If all of the above answers are "NO", then may proceed with Cephalosporin use.    Buspar [Buspirone] Nausea And Vomiting   Codeine Nausea And Vomiting    If pt takes a lot of this med   Latex Itching and Swelling    redness   Levaquin [Levofloxacin] Swelling    Can take up to 500 mg a day   Nsaids     Bleeding ulcers   Ciprofloxacin Nausea Only and Rash    Room was spinning    Immunization History  Administered Date(s) Administered   Moderna Sars-Covid-2 Vaccination 02/26/2020, 03/26/2020, 03/10/2021    Assessment/Plan:    COPD with emphysema and chronic bronchitis. - breztri and dry powder inhalers caused throat irritation and thrush - continue spiriva, and dulera - prn albuterol - complete course of prednisone from her PCP - advised her she could try drinking black tea, using  teaspoon of local honey, and try tumeric    Chronic respiratory failure with hypoxia. - she uses 2 to 4 liters oxygen with exertion and sleep - goal SpO2 > 90% - reassess O2 needs at her next visit   4 mm Lt upper lobe nodule on CT chest 02/07/20. - she has follow up CT chest scheduled or 11/18/21   Tobacco abuse. - reviewed options to help with smoking cessation   Tachycardia. - followed by Dr. Willene Hatchet with [facility]     Time Spent Involved in Patient Care on Day of Examination:  33 minutes   Follow up:    Patient Instructions  Your follow up CT chest scan is scheduled for November 18, 2021 - will call you with results   Follow up in 3 months

## 2021-11-19 NOTE — Telephone Encounter (Signed)
Spoke with Debra [name].  She started having a fever about 3 days ago.  This started after she was exposed to her brother who tested positive for the flu.  She had an episode of coughing a small amount of blood last night and this morning.  She was prescribed tamiflu by her PCP on 12/21.  She is feeling better today.  Advised her to complete course of tamiflu and monitor her cough and hemoptysis.  If hemoptysis persists or progresses, then she is to call back to get set up for a chest xray.

## 2021-11-25 ENCOUNTER — Other Ambulatory Visit: Payer: Self-pay

## 2021-11-25 MED ORDER — FLUCONAZOLE 150 MG PO TABS: 150.0000 mg | ORAL_TABLET | Freq: Every day | ORAL | 0 refills | Status: DC

## 2021-11-25 MED ORDER — PREDNISONE 10 MG PO TABS: 40.0000 mg | ORAL_TABLET | Freq: Every day | ORAL | 0 refills | Status: AC

## 2021-11-25 MED ORDER — AZITHROMYCIN 250 MG PO TABS: ORAL_TABLET | ORAL | 0 refills | Status: AC

## 2021-11-25 NOTE — Telephone Encounter (Signed)
Called patient to let her know recs. Would also like diflucan. She states when she takes a z pak she gets a yeast infection.   Please Advise. Thanks!!

## 2021-11-25 NOTE — Telephone Encounter (Signed)
Primary Pulmonologist: Dr. Craige Cotta Last office visit and with whom: Dr. Craige Cotta 10/16/21 What do we see them for (pulmonary problems): COPD  Last OV assessment/plan: see below   Was appointment offered to patient (explain)?  No, none available in [facility] office   Reason for call: Patient had sick call on 11/19/21 regarding flu like symptoms. States she has SOB and chest/lungs are tight and hurt. Nose will not stop running and is coughing up yellow mucus. No fever. States she completed tamiflu and has been taking nyquil. No longer coughing up blood. States this is day 9 of her symptoms and she is feeling really bad. Would like medicine called In.   Dr. Francine Graven please advise   Allergies  Allergen Reactions   Cefaclor Hives, Diarrhea and Rash   Sertraline     Suicidal thoughts, insomnia, nightmares   Statins Dermatitis and Rash    Multiple ecchymosis areas on all extremities.   Amoxicillin-Pot Clavulanate Diarrhea    Has patient had a PCN reaction causing immediate rash, facial/tongue/throat swelling, SOB or lightheadedness with hypotension: No Has patient had a PCN reaction causing severe rash involving mucus membranes or skin necrosis: No Has patient had a PCN reaction that required hospitalization: No Has patient had a PCN reaction occurring within the last 10 years: Yes If all of the above answers are "NO", then may proceed with Cephalosporin use.    Buspar [Buspirone] Nausea And Vomiting   Codeine Nausea And Vomiting    If pt takes a lot of this med   Latex Itching and Swelling    redness   Levaquin [Levofloxacin] Swelling    Can take up to 500 mg a day   Nsaids     Bleeding ulcers   Ciprofloxacin Nausea Only and Rash    Room was spinning    Immunization History  Administered Date(s) Administered   Moderna Sars-Covid-2 Vaccination 02/26/2020, 03/26/2020, 03/10/2021     Assessment/Plan:    COPD with emphysema and chronic bronchitis. - breztri and dry powder inhalers caused throat  irritation and thrush - continue spiriva, and dulera - prn albuterol - complete course of prednisone from her PCP - advised her she could try drinking black tea, using teaspoon of local honey, and try tumeric    Chronic respiratory failure with hypoxia. - she uses 2 to 4 liters oxygen with exertion and sleep - goal SpO2 > 90% - reassess O2 needs at her next visit   4 mm Lt upper lobe nodule on CT chest 02/07/20. - she has follow up CT chest scheduled or 11/18/21   Tobacco abuse. - reviewed options to help with smoking cessation   Tachycardia. - followed by Dr. Willene Hatchet with [facility]     Time Spent Involved in Patient Care on Day of Examination:  33 minutes   Follow up:    Patient Instructions  Your follow up CT chest scan is scheduled for November 18, 2021 - will call you with results   Follow up in 3 months

## 2021-11-25 NOTE — Telephone Encounter (Signed)
Medications sent in. Nothing further needed.

## 2021-12-02 ENCOUNTER — Telehealth: Payer: Self-pay | Admitting: Pulmonary Disease

## 2021-12-02 NOTE — Telephone Encounter (Signed)
Has been coughing as well and has been taking Delsym.

## 2021-12-02 NOTE — Telephone Encounter (Signed)
Worried it has gotten settled in her lungs.  Feels like she did when the antibiotic was called in the first time.  [facility] [address] [place]  Cannot take doxycycline.  Please advise.

## 2021-12-03 NOTE — Telephone Encounter (Signed)
Please schedule her for an ROV with me this week.  Okay to double book visit if needed.

## 2021-12-03 NOTE — Telephone Encounter (Signed)
Primary Pulmonologist: Dr. Craige Cotta  Last office visit and with whom: 10/16/21 What do we see them for (pulmonary problems): COPD  Last OV assessment/plan: see below   Was appointment offered to patient (explain)?  No, none available  Last sick call was on 11-19-2021   Reason for call:  Pt states ears and throat keep waking her up at night.  Has had the flu and feels went into lungs.  Was given an antibiotic last week and prednisone and not doing better finished yesterday. States she still feels the same as she did when she first got sick. Has been taking delsym as well.   Dr. Craige Cotta please advise.   Allergies  Allergen Reactions   Cefaclor Hives, Diarrhea and Rash   Sertraline     Suicidal thoughts, insomnia, nightmares   Statins Dermatitis and Rash    Multiple ecchymosis areas on all extremities.   Amoxicillin-Pot Clavulanate Diarrhea    Has patient had a PCN reaction causing immediate rash, facial/tongue/throat swelling, SOB or lightheadedness with hypotension: No Has patient had a PCN reaction causing severe rash involving mucus membranes or skin necrosis: No Has patient had a PCN reaction that required hospitalization: No Has patient had a PCN reaction occurring within the last 10 years: Yes If all of the above answers are "NO", then may proceed with Cephalosporin use.    Buspar [Buspirone] Nausea And Vomiting   Codeine Nausea And Vomiting    If pt takes a lot of this med   Latex Itching and Swelling    redness   Levaquin [Levofloxacin] Swelling    Can take up to 500 mg a day   Nsaids     Bleeding ulcers   Ciprofloxacin Nausea Only and Rash    Room was spinning    Immunization History  Administered Date(s) Administered   Moderna Sars-Covid-2 Vaccination 02/26/2020, 03/26/2020, 03/10/2021     Assessment/Plan:    COPD with emphysema and chronic bronchitis. - breztri and dry powder inhalers caused throat irritation and thrush - continue spiriva, and dulera - prn  albuterol - complete course of prednisone from her PCP - advised her she could try drinking black tea, using teaspoon of local honey, and try tumeric    Chronic respiratory failure with hypoxia. - she uses 2 to 4 liters oxygen with exertion and sleep - goal SpO2 > 90% - reassess O2 needs at her next visit   4 mm Lt upper lobe nodule on CT chest 02/07/20. - she has follow up CT chest scheduled or 11/18/21   Tobacco abuse. - reviewed options to help with smoking cessation   Tachycardia. - followed by Dr. Willene Hatchet with [facility]

## 2021-12-03 NOTE — Telephone Encounter (Signed)
Pt has an appt with primary care doctor today. States she called her PCP yesterday and has an appt today at 2:45 states she will go there since they are closer to her home but will call back if pcp recs an appt with Dr. Halford Chessman.   Will route to Dr. Halford Chessman as an Juluis Rainier

## 2021-12-03 NOTE — Telephone Encounter (Signed)
Noted  

## 2021-12-07 ENCOUNTER — Other Ambulatory Visit: Payer: Self-pay

## 2021-12-07 MED ORDER — SPIRIVA RESPIMAT 2.5 MCG/ACT IN AERS: 2.0000 | INHALATION_SPRAY | Freq: Every day | RESPIRATORY_TRACT | 11 refills | Status: DC

## 2021-12-07 MED ORDER — DULERA 200-5 MCG/ACT IN AERO: 2.0000 | INHALATION_SPRAY | Freq: Two times a day (BID) | RESPIRATORY_TRACT | 11 refills | Status: DC

## 2021-12-07 NOTE — Telephone Encounter (Signed)
Called and spoke to patient.  Patient states she had CXR done from PCP. CXR was clear. Pt needs a refill on Dulera.   Routing to Dr. Craige Cotta as an Lorain Childes

## 2021-12-07 NOTE — Telephone Encounter (Signed)
Pt calling back to let Dr. Craige Cotta know what was said at her appt.  Please advise.

## 2021-12-07 NOTE — Telephone Encounter (Signed)
Okay to send refill for [employer]

## 2021-12-07 NOTE — Telephone Encounter (Signed)
Refill for dulera sent. Patient aware

## 2022-01-28 ENCOUNTER — Ambulatory Visit: Payer: Medicaid Other | Admitting: Pulmonary Disease

## 2022-03-02 ENCOUNTER — Ambulatory Visit (HOSPITAL_COMMUNITY)
Admission: RE | Admit: 2022-03-02 | Discharge: 2022-03-02 | Disposition: A | Discharge status: Home / Self Care | Payer: Medicaid Other | Source: Ambulatory Visit | Attending: Pulmonary Disease | Admitting: Pulmonary Disease

## 2022-03-02 DIAGNOSIS — J432 Centrilobular emphysema: Secondary | ICD-10-CM | POA: Diagnosis not present

## 2022-03-02 DIAGNOSIS — IMO0001 Reserved for inherently not codable concepts without codable children: Secondary | ICD-10-CM

## 2022-03-02 DIAGNOSIS — I700 Atherosclerosis of aorta: Secondary | ICD-10-CM | POA: Insufficient documentation

## 2022-03-02 DIAGNOSIS — R911 Solitary pulmonary nodule: Secondary | ICD-10-CM | POA: Insufficient documentation

## 2022-03-16 ENCOUNTER — Ambulatory Visit: Payer: Self-pay | Admitting: Pulmonary Disease

## 2022-03-29 ENCOUNTER — Encounter (INDEPENDENT_AMBULATORY_CARE_PROVIDER_SITE_OTHER): Payer: Self-pay | Admitting: *Deleted

## 2022-04-03 IMAGING — CT CT ANGIO CHEST
2 of 6 series · 18 of 46 positions shown · IV contrast (Omnipaque or Isovue)
Comparison: Chest x-ray from the previous day.

CLINICAL DATA: Shortness of breath and left-sided chest pain with
decreased oxygen saturation

EXAM:
CT ANGIOGRAPHY CHEST WITH CONTRAST
TECHNIQUE: Multidetector CT imaging of the chest was performed using the
standard protocol during bolus administration of intravenous
contrast. Multiplanar CT image reconstructions and MIPs were
obtained to evaluate the vascular anatomy.
CONTRAST:  75mL OMNIPAQUE IOHEXOL 350 MG/ML SOLN

[Series 5: pe axial thins · axial · 0.70mm/px · z∈[+896,+1190]mm · 15 of 322 slices shown]
[im 14/322  lung]
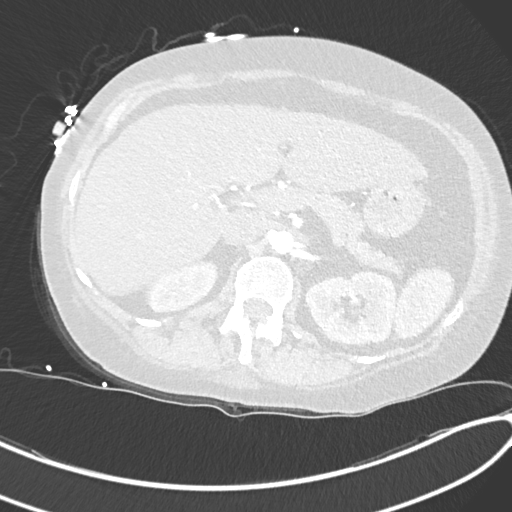
[im 42/322  soft-tissue]
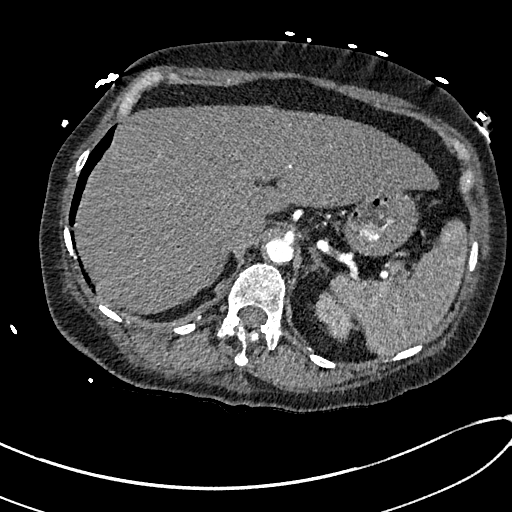
[im 56/322  lung]
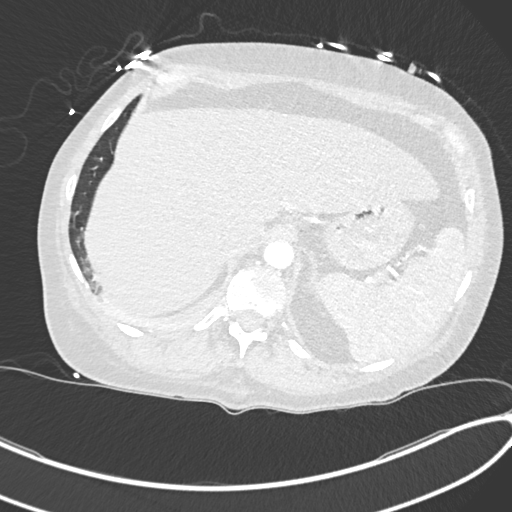
[im 84/322  soft-tissue]
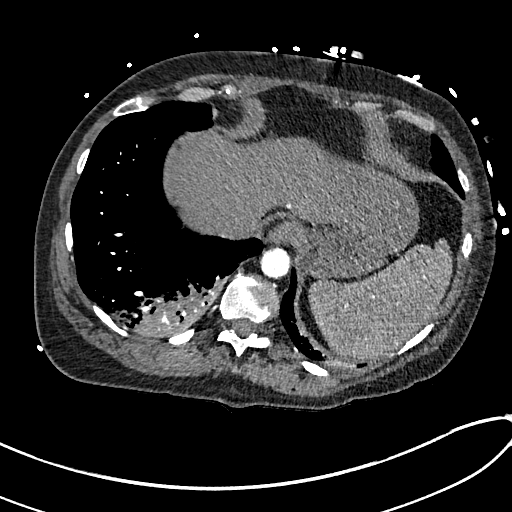
[im 98/322  lung]
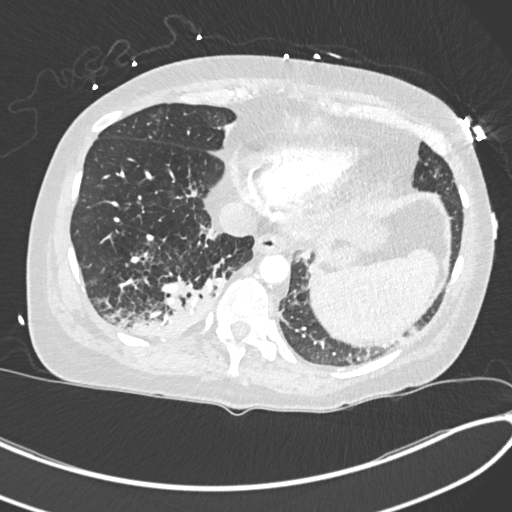
[im 126/322  soft-tissue]
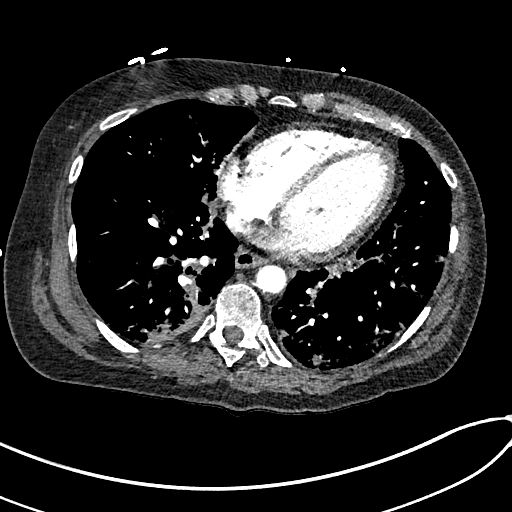
[im 140/322  lung]
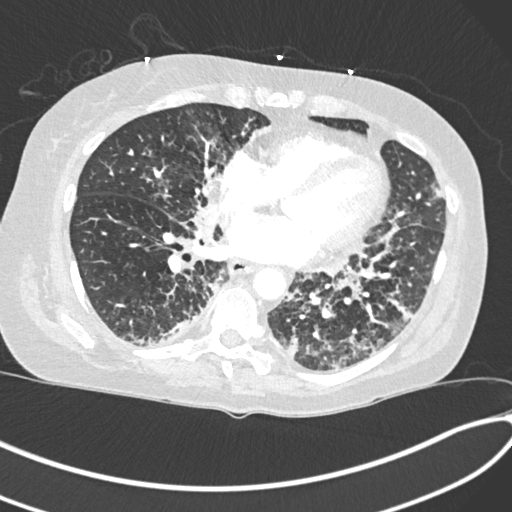
[im 168/322  soft-tissue]
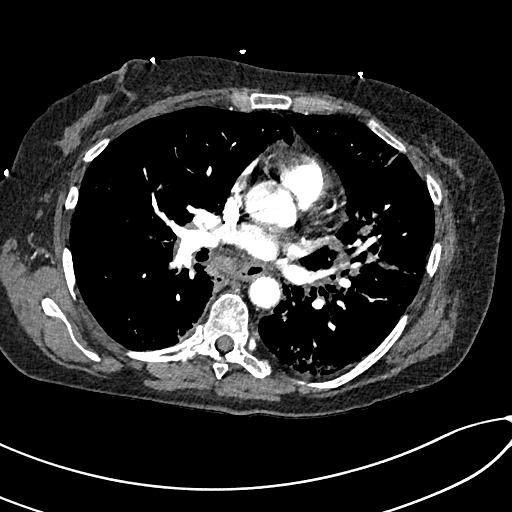
[im 182/322  lung]
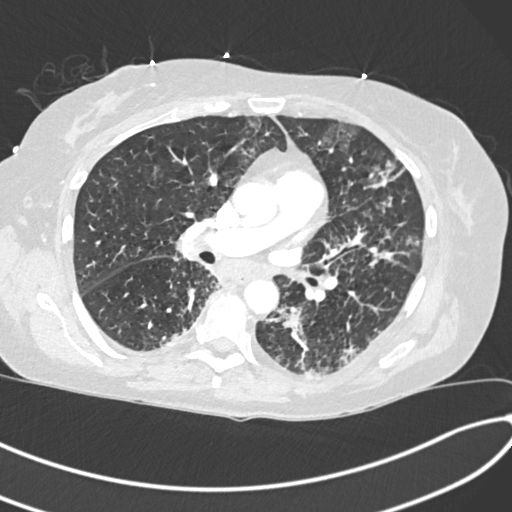
[im 196/322  soft-tissue]
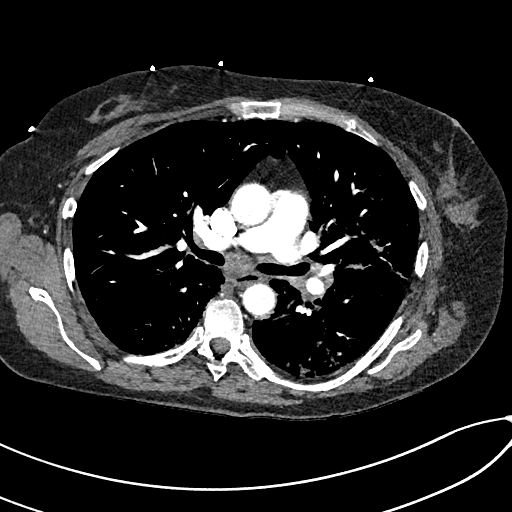
[im 224/322  lung]
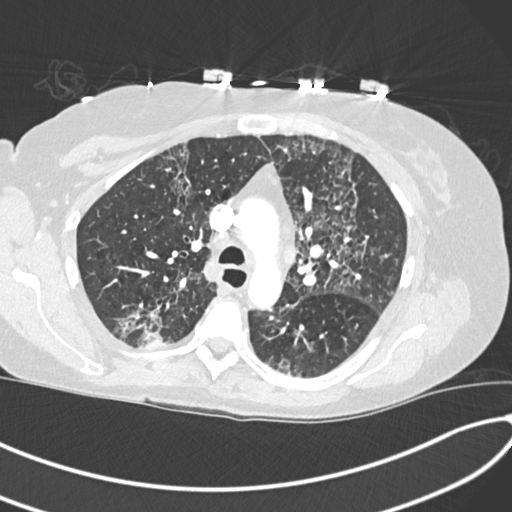
[im 238/322  soft-tissue]
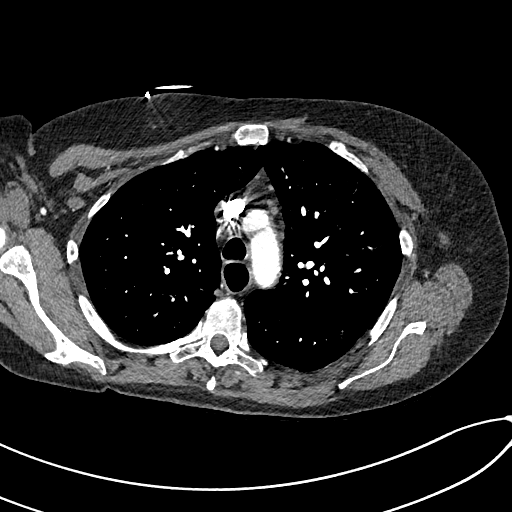
[im 266/322  lung]
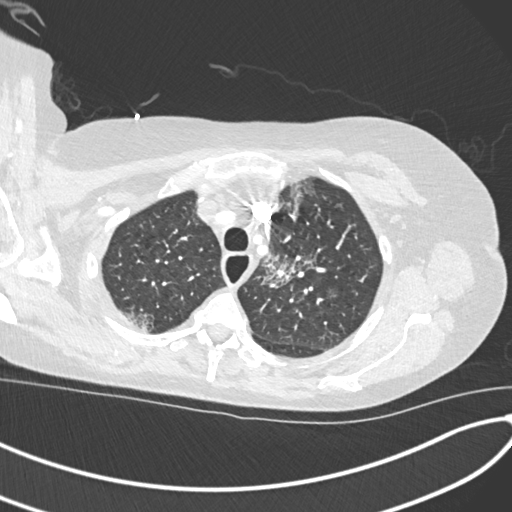
[im 280/322  soft-tissue]
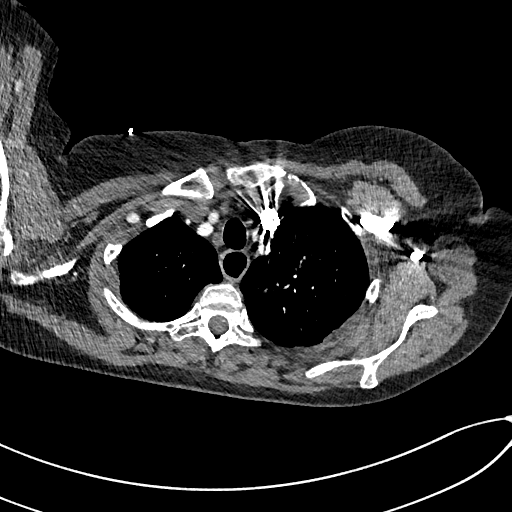
[im 308/322  lung]
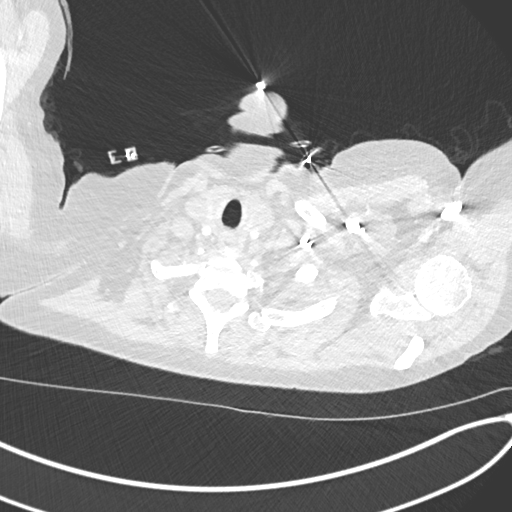

[Series 7: cor soft · coronal · 0.68mm/px · 3 of 151 slices shown]
[im 38/151  soft-tissue]
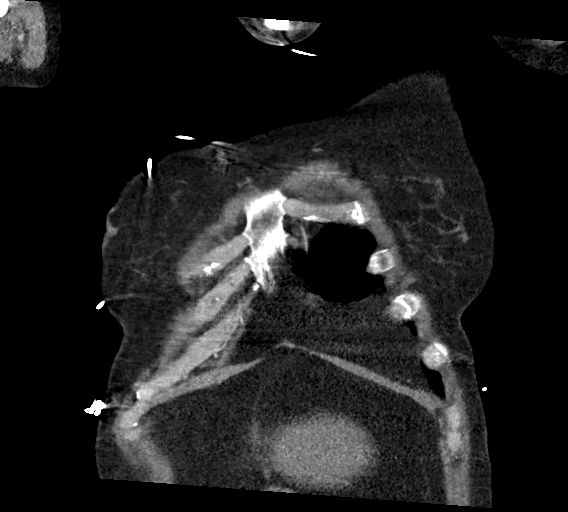
[im 76/151  soft-tissue]
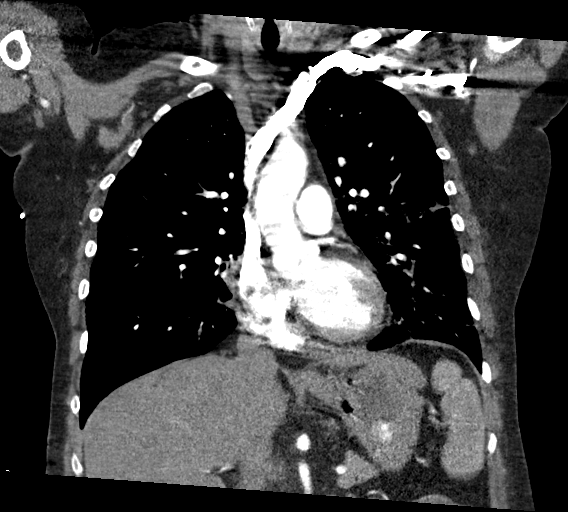
[im 113/151  soft-tissue]
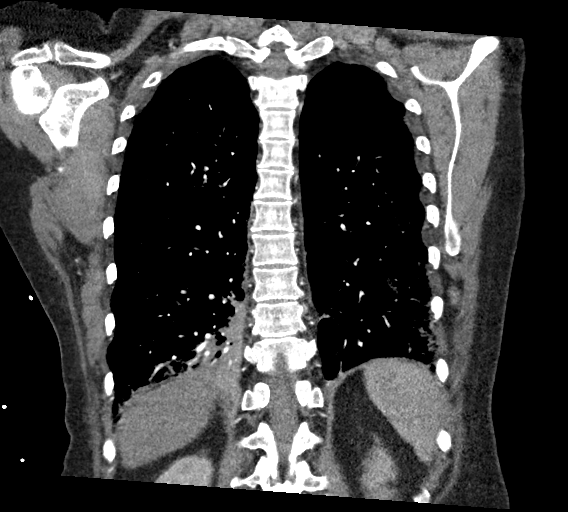

[18 of 46 positions shown; findings below may reference images not displayed]

FINDINGS: Cardiovascular: Thoracic aorta shows minimal atherosclerotic
calcifications. No aneurysmal dilatation or dissection is noted. No
cardiac enlargement is seen. The coronary arteries demonstrate mild
calcification. Pulmonary artery is well visualized within normal
branching pattern. No findings to suggest pulmonary emboli are
noted.

Mediastinum/Nodes: Thoracic inlet is within normal limits. Scattered
hilar and subcarinal adenopathy is noted likely reactive in nature.
The esophagus is within normal limits.

Lungs/Pleura: Consolidation is noted in the right lower lobe as well
as scattered predominately peripheral airspace opacities within both
lungs. No sizable effusion is noted. No sizable parenchymal nodules
are noted.

Upper Abdomen: No acute abnormality noted.

Musculoskeletal: No acute bony abnormality is noted

Review of the MIP images confirms the above findings.
IMPRESSION: Right lower lobe consolidation with patchy ground-glass airspace
opacities bilaterally consistent with multifocal pneumonia.
Correlate with [id number] testing.

No evidence of pulmonary emboli.

Aortic Atherosclerosis ([id number]-PJC.C).

## 2022-04-10 ENCOUNTER — Emergency Department (HOSPITAL_COMMUNITY): Payer: No Typology Code available for payment source

## 2022-04-10 ENCOUNTER — Observation Stay (HOSPITAL_COMMUNITY)
Admission: EM | Admit: 2022-04-10 | Discharge: 2022-04-12 | Disposition: A | Discharge status: Home / Self Care | Payer: No Typology Code available for payment source | Attending: Internal Medicine | Admitting: Internal Medicine

## 2022-04-10 ENCOUNTER — Encounter (HOSPITAL_COMMUNITY): Payer: Self-pay

## 2022-04-10 ENCOUNTER — Other Ambulatory Visit: Payer: Self-pay

## 2022-04-10 DIAGNOSIS — F1110 Opioid abuse, uncomplicated: Secondary | ICD-10-CM | POA: Insufficient documentation

## 2022-04-10 DIAGNOSIS — J449 Chronic obstructive pulmonary disease, unspecified: Secondary | ICD-10-CM | POA: Diagnosis not present

## 2022-04-10 DIAGNOSIS — R2681 Unsteadiness on feet: Secondary | ICD-10-CM | POA: Diagnosis not present

## 2022-04-10 DIAGNOSIS — G9340 Encephalopathy, unspecified: Secondary | ICD-10-CM | POA: Diagnosis not present

## 2022-04-10 DIAGNOSIS — Z91040 Latex allergy status: Secondary | ICD-10-CM | POA: Diagnosis not present

## 2022-04-10 DIAGNOSIS — W19XXXA Unspecified fall, initial encounter: Secondary | ICD-10-CM

## 2022-04-10 DIAGNOSIS — F17210 Nicotine dependence, cigarettes, uncomplicated: Secondary | ICD-10-CM | POA: Insufficient documentation

## 2022-04-10 DIAGNOSIS — F1310 Sedative, hypnotic or anxiolytic abuse, uncomplicated: Principal | ICD-10-CM | POA: Insufficient documentation

## 2022-04-10 DIAGNOSIS — R410 Disorientation, unspecified: Secondary | ICD-10-CM

## 2022-04-10 DIAGNOSIS — T50905A Adverse effect of unspecified drugs, medicaments and biological substances, initial encounter: Secondary | ICD-10-CM

## 2022-04-10 LAB — RAPID URINE DRUG SCREEN, HOSP PERFORMED
Amphetamines: NOT DETECTED
Barbiturates: NOT DETECTED
Benzodiazepines: POSITIVE — AB
Cocaine: NOT DETECTED
Opiates: NOT DETECTED
Tetrahydrocannabinol: POSITIVE — AB

## 2022-04-10 LAB — URINALYSIS, ROUTINE W REFLEX MICROSCOPIC
Bilirubin Urine: NEGATIVE
Glucose, UA: NEGATIVE mg/dL
Hgb urine dipstick: NEGATIVE
Ketones, ur: NEGATIVE mg/dL
Leukocytes,Ua: NEGATIVE
Nitrite: NEGATIVE
Protein, ur: NEGATIVE mg/dL
Specific Gravity, Urine: 1.005 (ref 1.005–1.030)
pH: 7 (ref 5.0–8.0)

## 2022-04-10 LAB — COMPREHENSIVE METABOLIC PANEL
ALT: 40 U/L (ref 0–44)
AST: 24 U/L (ref 15–41)
Albumin: 3.2 g/dL — ABNORMAL LOW (ref 3.5–5.0)
Alkaline Phosphatase: 92 U/L (ref 38–126)
Anion gap: 8 (ref 5–15)
BUN: 16 mg/dL (ref 6–20)
CO2: 32 mmol/L (ref 22–32)
Calcium: 8.8 mg/dL — ABNORMAL LOW (ref 8.9–10.3)
Chloride: 99 mmol/L (ref 98–111)
Creatinine, Ser: 0.54 mg/dL (ref 0.44–1.00)
GFR, Estimated: >60 mL/min (ref >60)
Glucose, Bld: 112 mg/dL — ABNORMAL HIGH (ref 70–99)
Potassium: 3.7 mmol/L (ref 3.5–5.1)
Sodium: 139 mmol/L (ref 135–145)
Total Bilirubin: 0.3 mg/dL (ref 0.3–1.2)
Total Protein: 6.1 g/dL — ABNORMAL LOW (ref 6.5–8.1)

## 2022-04-10 LAB — ETHANOL: Alcohol, Ethyl (B): <10 mg/dL (ref <10)

## 2022-04-10 LAB — CBC WITH DIFFERENTIAL/PLATELET
Abs Immature Granulocytes: 0.19 10*3/uL — ABNORMAL HIGH (ref 0.00–0.07)
Basophils Absolute: 0.1 10*3/uL (ref 0.0–0.1)
Basophils Relative: 1 %
Eosinophils Absolute: 0.2 10*3/uL (ref 0.0–0.5)
Eosinophils Relative: 1 %
HCT: 37 % (ref 36.0–46.0)
Hemoglobin: 11.5 g/dL — ABNORMAL LOW (ref 12.0–15.0)
Immature Granulocytes: 1 %
Lymphocytes Relative: 35 %
Lymphs Abs: 5.3 10*3/uL — ABNORMAL HIGH (ref 0.7–4.0)
MCH: 29 pg (ref 26.0–34.0)
MCHC: 31.1 g/dL (ref 30.0–36.0)
MCV: 93.2 fL (ref 80.0–100.0)
Monocytes Absolute: 1.5 10*3/uL — ABNORMAL HIGH (ref 0.1–1.0)
Monocytes Relative: 10 %
Neutro Abs: 7.8 10*3/uL — ABNORMAL HIGH (ref 1.7–7.7)
Neutrophils Relative %: 52 %
Platelets: 367 10*3/uL (ref 150–400)
RBC: 3.97 MIL/uL (ref 3.87–5.11)
RDW: 14.3 % (ref 11.5–15.5)
WBC: 15.1 10*3/uL — ABNORMAL HIGH (ref 4.0–10.5)
nRBC: 0 % (ref 0.0–0.2)

## 2022-04-10 LAB — PROTIME-INR
INR: 0.8 (ref 0.8–1.2)
Prothrombin Time: 11.3 seconds — ABNORMAL LOW (ref 11.4–15.2)

## 2022-04-10 LAB — CBG MONITORING, ED: Glucose-Capillary: 111 mg/dL — ABNORMAL HIGH (ref 70–99)

## 2022-04-10 LAB — LACTIC ACID, PLASMA: Lactic Acid, Venous: 1.1 mmol/L (ref 0.5–1.9)

## 2022-04-10 MED ORDER — PANTOPRAZOLE SODIUM 40 MG PO TBEC
40.0000 mg | DELAYED_RELEASE_TABLET | Freq: Every day | ORAL | Status: DC
Start: 2022-04-11 — End: 2022-04-12
  Administered 2022-04-11 – 2022-04-12 (×2): 40 mg via ORAL
  Filled 2022-04-10 (×2): qty 1

## 2022-04-10 MED ORDER — METOPROLOL TARTRATE 50 MG PO TABS
50.0000 mg | ORAL_TABLET | Freq: Two times a day (BID) | ORAL | Status: DC
  Administered 2022-04-11 – 2022-04-12 (×3): 50 mg via ORAL
  Filled 2022-04-10 (×2): qty 2
  Filled 2022-04-10 (×2): qty 1

## 2022-04-10 MED ORDER — ENOXAPARIN SODIUM 40 MG/0.4ML IJ SOSY
40.0000 mg | PREFILLED_SYRINGE | INTRAMUSCULAR | Status: DC
  Administered 2022-04-11 – 2022-04-12 (×2): 40 mg via SUBCUTANEOUS
  Filled 2022-04-10 (×2): qty 0.4

## 2022-04-10 MED ORDER — ACETAMINOPHEN 650 MG RE SUPP: 650.0000 mg | Freq: Four times a day (QID) | RECTAL | Status: DC | PRN

## 2022-04-10 MED ORDER — ATORVASTATIN CALCIUM 40 MG PO TABS
40.0000 mg | ORAL_TABLET | Freq: Every day | ORAL | Status: DC
  Administered 2022-04-11 – 2022-04-12 (×2): 40 mg via ORAL
  Filled 2022-04-10 (×2): qty 1

## 2022-04-10 MED ORDER — ACETAMINOPHEN 325 MG PO TABS
650.0000 mg | ORAL_TABLET | Freq: Four times a day (QID) | ORAL | Status: DC | PRN
  Administered 2022-04-11: 650 mg via ORAL
  Filled 2022-04-10: qty 2

## 2022-04-10 MED ORDER — OXYCODONE-ACETAMINOPHEN 5-325 MG PO TABS
0.5000 | ORAL_TABLET | Freq: Three times a day (TID) | ORAL | Status: DC | PRN
  Administered 2022-04-11: 0.5 via ORAL
  Filled 2022-04-10: qty 1

## 2022-04-10 MED ORDER — LACTATED RINGERS IV BOLUS
1000.0000 mL | Freq: Once | INTRAVENOUS | Status: AC
  Administered 2022-04-10: 1000 mL via INTRAVENOUS

## 2022-04-10 MED ORDER — CITALOPRAM HYDROBROMIDE 20 MG PO TABS
40.0000 mg | ORAL_TABLET | Freq: Every day | ORAL | Status: DC
  Administered 2022-04-11 – 2022-04-12 (×2): 40 mg via ORAL
  Filled 2022-04-10: qty 2
  Filled 2022-04-10: qty 4

## 2022-04-10 MED ORDER — TIOTROPIUM BROMIDE MONOHYDRATE 2.5 MCG/ACT IN AERS: 2.0000 | INHALATION_SPRAY | Freq: Every day | RESPIRATORY_TRACT | Status: DC

## 2022-04-10 MED ORDER — NICOTINE 21 MG/24HR TD PT24
21.0000 mg | MEDICATED_PATCH | Freq: Every day | TRANSDERMAL | Status: DC
  Administered 2022-04-11 – 2022-04-12 (×2): 21 mg via TRANSDERMAL
  Filled 2022-04-10 (×2): qty 1

## 2022-04-10 MED ORDER — ALBUTEROL SULFATE (2.5 MG/3ML) 0.083% IN NEBU
2.5000 mg | INHALATION_SOLUTION | Freq: Once | RESPIRATORY_TRACT | Status: DC
  Filled 2022-04-10: qty 3

## 2022-04-10 MED ORDER — NICOTINE 21 MG/24HR TD PT24
21.0000 mg | MEDICATED_PATCH | Freq: Once | TRANSDERMAL | Status: DC
Start: 2022-04-10 — End: 2022-04-11
  Administered 2022-04-10: 21 mg via TRANSDERMAL
  Filled 2022-04-10: qty 1

## 2022-04-10 MED ORDER — MOMETASONE FURO-FORMOTEROL FUM 200-5 MCG/ACT IN AERO
2.0000 | INHALATION_SPRAY | Freq: Two times a day (BID) | RESPIRATORY_TRACT | Status: DC
  Administered 2022-04-12: 2 via RESPIRATORY_TRACT
  Filled 2022-04-10 (×2): qty 8.8

## 2022-04-10 NOTE — ED Notes (Signed)
Report given to MRI.

## 2022-04-10 NOTE — ED Provider Notes (Signed)
?[facility] EMERGENCY DEPARTMENT ?Provider Note ? ? ?CSN: [id number] ?Arrival date & time: 04/10/22  1318 ? ?  ? ?History ? ?Chief Complaint  ?Patient presents with  ? Fall  ? Altered Mental Status  ? ? ?Debra [name] is a 56 y.o. female. ? ? ?Fall ? ?Altered Mental Status ?Patient presents for altered mental status.  She currently lives alone.  She informed her sons that she had a fall 3 days ago.  1 of patient's son spoke with her on the phone at that time.  She reports that she seemed to be in her normal mental state when speaking with her on the phone.  Yesterday, she seemed confused.  Patient's sons came to her home where she was talking bizarrely and endorsing visual hallucinations.  Patient had another unwitnessed fall overnight.  This morning, she called her son early and again seemed very confused.  Patient's son has been with her since 8 AM this morning.  They report a waxing and waning confusion.  She has had unsteady gait today.  Over the past 2 days, EMS has been called to her home twice.  Patient has declined transport to hospital.  Patient was talked into coming to the hospital by her sons, who accompany her in the ED.  She has requested to be discharged but they report that she is still not in her normal mental state.  She is on chronic narcotics and benzodiazepines. ?  ? ?Home Medications ?Prior to Admission medications   ?Medication Sig Start Date End Date Taking? Authorizing Provider  ?acetaminophen (TYLENOL) 500 MG tablet Take 500 mg by mouth every 6 (six) hours as needed for moderate pain.   Yes [provider]  ?atorvastatin (LIPITOR) 40 MG tablet Take 40 mg by mouth daily. 03/16/22  Yes [provider]  ?citalopram (CELEXA) 40 MG tablet Take 40 mg by mouth daily.    Yes [provider]  ?diazepam (VALIUM) 5 MG tablet Take 5 mg by mouth every 6 (six) hours as needed for anxiety. 04/01/22  Yes [provider]  ?DULERA 200-5 MCG/ACT AERO Inhale 2  puffs into the lungs 2 (two) times daily. 12/07/21  Yes Coralyn Helling, MD  ?fexofenadine (ALLEGRA) 60 MG tablet Take 60 mg by mouth daily. 03/26/22  Yes [provider]  ?fluticasone (FLONASE) 50 MCG/ACT nasal spray Place 1 spray into both nostrils 2 (two) times daily. 03/26/22  Yes [provider]  ?loratadine (CLARITIN) 10 MG tablet Take 10 mg by mouth daily as needed for allergies.   Yes [provider]  ?metoprolol tartrate (LOPRESSOR) 50 MG tablet Take 50 mg by mouth 2 (two) times daily. 03/16/22  Yes [provider]  ?omeprazole (PRILOSEC) 40 MG capsule Take 40 mg by mouth daily.   Yes [provider]  ?oxyCODONE-acetaminophen (PERCOCET/ROXICET) 5-325 MG tablet Take 1 tablet by mouth every 6 (six) hours as needed for severe pain. 03/29/22  Yes [provider]  ?pregabalin (LYRICA) 100 MG capsule Take 100 mg by mouth 3 (three) times daily.   Yes [provider]  ?PROAIR HFA 108 (90 Base) MCG/ACT inhaler Inhale 2 puffs into the lungs every 4 (four) hours as needed for wheezing or shortness of breath.  06/19/20  Yes [provider]  ?Tiotropium Bromide Monohydrate (SPIRIVA RESPIMAT) 2.5 MCG/ACT AERS Inhale 2 puffs into the lungs daily. 12/07/21  Yes Coralyn Helling, MD  ?fluconazole (DIFLUCAN) 150 MG tablet Take 1 tablet (150 mg total) by mouth daily. ?Patient  not taking: Reported on 04/10/2022 11/25/21   Martina Sinnerewald, Jonathan B, MD  ?guaiFENesin (MUCINEX) 600 MG 12 hr tablet Take 2 tablets (1,200 mg total) by mouth 2 (two) times daily. ?Patient not taking: Reported on 10/16/2021 12/04/20   Shon HaleEmokpae, Courage, MD  ?metFORMIN (GLUCOPHAGE-XR) 500 MG 24 hr tablet Take 500 mg by mouth 2 (two) times daily. 03/25/22   [provider]  ?oxyCODONE-acetaminophen (PERCOCET) 7.5-325 MG tablet Take 1 tablet by mouth every 8 (eight) hours as needed for moderate pain. ?Patient not taking: Reported on 04/10/2022 12/04/20   Shon HaleEmokpae, Courage, MD  ?potassium chloride (KLOR-CON)  10 MEQ tablet Take 1 tablet (10 mEq total) by mouth 2 (two) times daily. ?Patient not taking: Reported on 10/16/2021 12/04/20   Shon HaleEmokpae, Courage, MD  ?predniSONE (DELTASONE) 10 MG tablet Take 3 tabs x 2 days, then take 2 tabs x 2 days, then take 1 tab x 2 days, then stop. ?Patient not taking: Reported on 04/10/2022 08/31/21   Coralyn HellingSood, Vineet, MD  ?   ? ?Allergies    ?Cefaclor, Sertraline, Statins, Amoxicillin-pot clavulanate, Buspar [buspirone], Codeine, Latex, Levaquin [levofloxacin], Nsaids, and Ciprofloxacin   ? ?Review of Systems   ?Review of Systems  ?Unable to perform ROS: Mental status change  ? ?Physical Exam ?Updated Vital Signs ?BP 119/86   Pulse 65   Temp 98.8 ?F (37.1 ?C) (Oral)   Resp 14   SpO2 95%  ?Physical Exam ?Vitals and nursing note reviewed.  ?Constitutional:   ?   General: She is not in acute distress. ?   Appearance: Normal appearance. She is well-developed. She is not ill-appearing, toxic-appearing or diaphoretic.  ?HENT:  ?   Head: Normocephalic and atraumatic.  ?   Right Ear: External ear normal.  ?   Left Ear: External ear normal.  ?   Nose: Nose normal.  ?   Mouth/Throat:  ?   Mouth: Mucous membranes are moist.  ?   Pharynx: Oropharynx is clear.  ?Eyes:  ?   Extraocular Movements: Extraocular movements intact.  ?   Conjunctiva/sclera: Conjunctivae normal.  ?Cardiovascular:  ?   Rate and Rhythm: Normal rate and regular rhythm.  ?   Heart sounds: No murmur heard. ?Pulmonary:  ?   Effort: Pulmonary effort is normal. No respiratory distress.  ?   Breath sounds: Rhonchi present.  ?Abdominal:  ?   Palpations: Abdomen is soft.  ?   Tenderness: There is no abdominal tenderness.  ?Musculoskeletal:     ?   General: No swelling. Normal range of motion.  ?   Cervical back: Normal range of motion and neck supple.  ?   Right lower leg: No edema.  ?   Left lower leg: No edema.  ?Skin: ?   General: Skin is warm and dry.  ?   Capillary Refill: Capillary refill takes less than 2 seconds.  ?   Coloration: Skin  is not jaundiced or pale.  ?Neurological:  ?   General: No focal deficit present.  ?   Mental Status: She is alert and oriented to person, place, and time.  ?   Cranial Nerves: No cranial nerve deficit.  ?   Sensory: No sensory deficit.  ?   Motor: No weakness.  ?   Coordination: Coordination normal.  ?Psychiatric:     ?   Attention and Perception: She perceives visual hallucinations.     ?   Mood and Affect: Mood is not anxious. Affect is labile. Affect is not angry.     ?  Speech: Speech is slurred. Speech is not delayed.     ?   Behavior: Behavior is slowed. Behavior is not agitated or combative.  ? ? ?ED Results / Procedures / Treatments   ?Labs ?(all labs ordered are listed, but only abnormal results are displayed) ?Labs Reviewed  ?COMPREHENSIVE METABOLIC PANEL - Abnormal; Notable for the following components:  ?    Result Value  ? Glucose, Bld 112 (*)   ? Calcium 8.8 (*)   ? Total Protein 6.1 (*)   ? Albumin 3.2 (*)   ? All other components within normal limits  ?CBC WITH DIFFERENTIAL/PLATELET - Abnormal; Notable for the following components:  ? WBC 15.1 (*)   ? Hemoglobin 11.5 (*)   ? Neutro Abs 7.8 (*)   ? Lymphs Abs 5.3 (*)   ? Monocytes Absolute 1.5 (*)   ? Abs Immature Granulocytes 0.19 (*)   ? All other components within normal limits  ?URINALYSIS, ROUTINE W REFLEX MICROSCOPIC - Abnormal; Notable for the following components:  ? Color, Urine STRAW (*)   ? All other components within normal limits  ?RAPID URINE DRUG SCREEN, HOSP PERFORMED - Abnormal; Notable for the following components:  ? Benzodiazepines POSITIVE (*)   ? Tetrahydrocannabinol POSITIVE (*)   ? All other components within normal limits  ?PROTIME-INR - Abnormal; Notable for the following components:  ? Prothrombin Time 11.3 (*)   ? All other components within normal limits  ?CBG MONITORING, ED - Abnormal; Notable for the following components:  ? Glucose-Capillary 111 (*)   ? All other components within normal limits  ?CULTURE, BLOOD  (ROUTINE X 2)  ?CULTURE, BLOOD (ROUTINE X 2)  ?URINE CULTURE  ?ETHANOL  ?LACTIC ACID, PLASMA  ?LACTIC ACID, PLASMA  ?ACETAMINOPHEN LEVEL  ?SALICYLATE LEVEL  ? ? ?EKG ?EKG Interpretation ? ?Date/Time:  Saturday

## 2022-04-10 NOTE — ED Notes (Signed)
Lactic Acid redraw order has been cancelled, but is not allowing this RN to discontinue order. Lactic acid not needed at this time.  ?

## 2022-04-10 NOTE — ED Notes (Signed)
Called staffing requesting sitter ?

## 2022-04-10 NOTE — ED Provider Triage Note (Signed)
Emergency Medicine Provider Triage Evaluation Note ? ?Debra [name] , a 56 y.o. female  was evaluated in triage.  Pt complains of fall, head injury. Presents with her 2 sons that provide a majority of the history. State that 4 days ago the patient fell and hit her head. Unsure LOC, states that she declined medical care at this time. States that at 2:30 yesterday evening she began acting abnormally. States that some time in the night she fell again and struck the back of her head against a coffee table. Again unsure LOC. Sons state that today she has at times not recognized them and has been saying that she is seeing things that are not there. Patient is alert and oriented to self, unable to tell me the date or where she is. Sons state she is normally A&O x4, do state that she is on chronic narcotics for her back pain and sometimes falls when she takes too many narcotics. Patient endorses headache and neck pain, denies any other complaints. ? ?Of note, patient is agitated and states she wants to leave AMA, son is HPOA states he will seek IVC if she attempts to leave ? ?Review of Systems  ?Positive:  ?Negative: See above ? ?Physical Exam  ?BP 112/87   Pulse 72   Temp 98.8 ?F (37.1 ?C) (Oral)   Resp 14   SpO2 90%  ?Gen:   Awake, no distress   ?Resp:  Normal effort  ?MSK:   Moves extremities without difficulty  ?Other:  PERRLA, EOMs intact. No facial droop or arm drift. No focal neurologic deficits ? ?Medical Decision Making  ?Medically screening exam initiated at 3:02 PM.  Appropriate orders placed.  Jamel Dunton was informed that the remainder of the evaluation will be completed by another provider, this initial triage assessment does not replace that evaluation, and the importance of remaining in the ED until their evaluation is complete. ? ? ?  ?Silva Bandy, PA-C ?04/10/22 1510 ? ?

## 2022-04-10 NOTE — ED Triage Notes (Signed)
Pt arrived POV from home c/o a fall that happened 3 days ago. Family attempted to get EMS to bring patient in but refused. Pt is not oriented to situation. Pt is alert to self. Per her son pt is normally alert and oriented x4. Pt was having moments where she does not recognize her son, pt does not know the day of the week, and per family is having hallucinations and seeing things that aren't there.  ?

## 2022-04-10 NOTE — H&P (Addendum)
? ?NAME:  Debra [name], MRN:  [id number], DOB:  1966-08-09, LOS: 0 ?ADMISSION DATE:  04/10/2022, Primary: [name], MD  ?CHIEF COMPLAINT: Altered mental status ? ?Medical Service: Internal Medicine Teaching Service    ?     ?Attending Physician: Dr. Reymundo Poll, MD    ?First Contact: Dr. Alroy Bailiff Pager: [phone]  ?Second Contact: Dr. Montez Morita Pager: [phone]  ?     ?After Hours (After 5p/  First Contact Pager: [phone]  ?weekends / holidays): Second Contact Pager: [phone]  ? ? ?History of present illness   ?Debra [name] is a 56 year old female with pertinent history of polysubstance abuse (status post multiple admissions to rehab) and polypharmacy who was brought to the ED this morning by her sons for 3-day history of altered mental status.  Portions of the history were obtained from the patient although these portion should be considered unreliable in the setting of encephalopathy.  History primarily obtained from patient's 2 sons, [name] and [name]. ? ?She reportedly fell down a [name] of stairs 3 days ago.  Prior to this, they state that she was in her usual state of health and mental status.  They do comment that she does have some baseline cognitive dysfunction which they attribute to her polypharmacy however she is typically able to have a normal conversation.  After the fall, they note that she had not been acting her typical self during some phone calls that they had had with her.  They do not reside with her.  [name] typically sees her once a week however. ?They are able to provide a couple of examples of some abnormalities that she showed.  One of them being that she called [name] at 5:00 in the morning asking him if he had a cold.  He had not been feeling sick or anything for her to have this idea.  When he told her it was 5:00 in the morning, she told him he was wrong and that it was not.  They also note that she has been saying she is having some kind of visual and auditory hallucinations including  seeing a ghost in the waiting room and talking to someone in her room who was not there. ? ?They note that she misuses her prescription medications.  She obtains these from various providers.  They say that she will sometimes just toss a bunch in her mouth without actually counting them.  This has been a longstanding issue.  They are not aware of any recent medication changes. ? ?They note that she has been eating some THC Gummies.  They are not aware of any additional illicit substance use however they admit that they do not live with her and therefore cannot definitively say that she does not do anything else.  They do note that she does not drink alcohol. ? ?Both the patient and her sons deny any recent illnesses.  No fevers, chills, nausea, vomiting, diarrhea, constipation, dysuria, headaches, photosensitivity, or neck pain. ? ?Past Medical History  ?She,  has a past medical history of Anxiety, Arthritis, Chronic lower back pain, Complication of anesthesia, COPD (chronic obstructive pulmonary disease) (HCC), Depression, Dyspnea, GERD (gastroesophageal reflux disease), History of bleeding ulcers (1994), History of hiatal hernia, History of low potassium, and Pneumonia (2013).  ? ?Home Medications    ? ?Prior to Admission medications   ?Medication Sig Start Date End Date Taking? Authorizing Provider  ?acetaminophen (TYLENOL) 500 MG tablet Take 500 mg by mouth every 6 (six) hours as needed for moderate  pain.   Yes [provider]  ?atorvastatin (LIPITOR) 40 MG tablet Take 40 mg by mouth daily. 03/16/22  Yes [provider]  ?citalopram (CELEXA) 40 MG tablet Take 40 mg by mouth daily.    Yes [provider]  ?diazepam (VALIUM) 5 MG tablet Take 5 mg by mouth every 6 (six) hours as needed for anxiety. 04/01/22  Yes [provider]  ?DULERA 200-5 MCG/ACT AERO Inhale 2 puffs into the lungs 2 (two) times daily. 12/07/21  Yes Coralyn Helling, MD  ?fexofenadine (ALLEGRA) 60 MG tablet Take 60 mg  by mouth daily. 03/26/22  Yes [provider]  ?fluticasone (FLONASE) 50 MCG/ACT nasal spray Place 1 spray into both nostrils 2 (two) times daily. 03/26/22  Yes [provider]  ?loratadine (CLARITIN) 10 MG tablet Take 10 mg by mouth daily as needed for allergies.   Yes [provider]  ?metoprolol tartrate (LOPRESSOR) 50 MG tablet Take 50 mg by mouth 2 (two) times daily. 03/16/22  Yes [provider]  ?omeprazole (PRILOSEC) 40 MG capsule Take 40 mg by mouth daily.   Yes [provider]  ?oxyCODONE-acetaminophen (PERCOCET/ROXICET) 5-325 MG tablet Take 1 tablet by mouth every 6 (six) hours as needed for severe pain. 03/29/22  Yes [provider]  ?pregabalin (LYRICA) 100 MG capsule Take 100 mg by mouth 3 (three) times daily.   Yes [provider]  ?PROAIR HFA 108 (90 Base) MCG/ACT inhaler Inhale 2 puffs into the lungs every 4 (four) hours as needed for wheezing or shortness of breath.  06/19/20  Yes [provider]  ?Tiotropium Bromide Monohydrate (SPIRIVA RESPIMAT) 2.5 MCG/ACT AERS Inhale 2 puffs into the lungs daily. 12/07/21  Yes Coralyn Helling, MD  ?metFORMIN (GLUCOPHAGE-XR) 500 MG 24 hr tablet Take 500 mg by mouth 2 (two) times daily. 03/25/22   [provider]  ?potassium chloride (KLOR-CON) 10 MEQ tablet Take 1 tablet (10 mEq total) by mouth 2 (two) times daily. ?Patient not taking: Reported on 10/16/2021 12/04/20   Shon Hale, MD  ? ? ?Allergies   ? ?Allergies as of 04/10/2022 - Review Complete 04/10/2022  ?Allergen Reaction Noted  ? Cefaclor Hives, Diarrhea, and Rash 10/21/2010  ? Sertraline  01/04/2012  ? Statins Dermatitis and Rash 11/23/2020  ? Amoxicillin-pot clavulanate Diarrhea 10/21/2010  ? Buspar [buspirone] Nausea And Vomiting 04/07/2021  ? Codeine Nausea And Vomiting 10/21/2010  ? Latex Itching and Swelling 05/06/2017  ? Levaquin [levofloxacin] Swelling 05/31/2018  ? Nsaids  05/31/2018  ? Ciprofloxacin Nausea Only and Rash  06/02/2020  ? ? ?Social History  ? reports that she has been smoking cigarettes. She has a 17.50 pack-year smoking history. She has never used smokeless tobacco. She reports current alcohol use. She reports that she does not currently use drugs.  ? ?Family History   ?Her family history includes COPD in her mother; Heart attack in her paternal grandfather and paternal uncle; Heart disease in her father; Hypertension in her mother; Ulcers in her father.  ? ?ROS  ?Unable to be obtained due to acute encephalopathy ? ?Objective   ?Blood pressure 119/86, pulse 70, temperature 98.8 ?F (37.1 ?C), temperature source Oral, resp. rate 17, SpO2 95 %. ?   ?General: Chronically ill female appearing older than her stated age, disheveled ?HEENT: Moderate-sized hematoma overlying the right occipital region.  Dried blood present on her hair.  Unable to appreciate a clear laceration.  No blood in the ear canals.  No pain on palpation over the C-spine,  T-spine, L-spine.  Neck range of motion is intact and does not elicit pain.  No scleral icterus. ?Cardiac: Heart regular rate and rhythm, trace lower extremity edema ?Pulm: Breathing comfortably on room air, diminished lung sounds throughout, no wheezing ?GI: Abdomen soft, nondistended, nontender.  No suprapubic pain on palpation. ?Skin: Scattered bruising in various stages of healing overlying her legs, arms. ?Neuro: Alert.  Oriented to person, month, year, and president.  [place] did [place].  Speech is slow.  No dysarthria.  Pupils are equal round and reactive to light.  No facial droop.  Tongue and uvula midline.  EOMs intact.  5 out of 5 strength equal in the bilateral upper and lower extremities.  No tremor. ?Significant Diagnostic Tests:  ? ? ?  Latest Ref Rng & Units 04/10/2022  ?  3:18 PM 12/02/2020  ?  4:26 AM 12/01/2020  ?  2:54 AM  ?CBC  ?WBC 4.0 - 10.5 K/uL 15.1   14.9   15.4    ?Hemoglobin 12.0 - 15.0 g/dL 16.111.5   09.612.9   04.512.7     ?Hematocrit 36.0 - 46.0 % 37.0   43.7   42.6    ?Platelets 150 - 400 K/uL 367   646   607    ? ? ?  Latest Ref Rng & Units 04/10/2022  ?  3:18 PM 12/01/2020  ?  2:54 AM 11/30/2020  ?  5:07 AM  ?BMP  ?Glucose 70 - 99

## 2022-04-10 NOTE — ED Notes (Signed)
Staffing office stated they will unlikely be able to get a sitter for pt tonight and will notify the charge RN if they are not able to get one. At this time family is at bedside with pt ?

## 2022-04-10 NOTE — ED Notes (Signed)
3 copies of ivc paperwork handed to rn julie ?

## 2022-04-10 NOTE — ED Notes (Signed)
Pt off unit to CT

## 2022-04-11 DIAGNOSIS — G9340 Encephalopathy, unspecified: Secondary | ICD-10-CM | POA: Diagnosis not present

## 2022-04-11 LAB — HIV ANTIBODY (ROUTINE TESTING W REFLEX): HIV Screen 4th Generation wRfx: NONREACTIVE

## 2022-04-11 LAB — FOLATE: Folate: 8.7 ng/mL (ref >5.9)

## 2022-04-11 LAB — URINE CULTURE: Culture: <10000 — AB

## 2022-04-11 LAB — VITAMIN B12: Vitamin B-12: 175 pg/mL — ABNORMAL LOW (ref 180–914)

## 2022-04-11 LAB — TSH: TSH: 1.742 u[IU]/mL (ref 0.350–4.500)

## 2022-04-11 MED ORDER — CYANOCOBALAMIN 1000 MCG/ML IJ SOLN
1000.0000 ug | Freq: Once | INTRAMUSCULAR | Status: AC
  Administered 2022-04-11: 1000 ug via INTRAMUSCULAR
  Filled 2022-04-11: qty 1

## 2022-04-11 MED ORDER — THIAMINE HCL 100 MG PO TABS
100.0000 mg | ORAL_TABLET | Freq: Every day | ORAL | Status: DC
  Administered 2022-04-12: 100 mg via ORAL
  Filled 2022-04-11: qty 1

## 2022-04-11 MED ORDER — FOLIC ACID 1 MG PO TABS
1.0000 mg | ORAL_TABLET | Freq: Every day | ORAL | Status: DC
  Administered 2022-04-11 – 2022-04-12 (×2): 1 mg via ORAL
  Filled 2022-04-11 (×2): qty 1

## 2022-04-11 MED ORDER — PREGABALIN 100 MG PO CAPS
100.0000 mg | ORAL_CAPSULE | Freq: Three times a day (TID) | ORAL | Status: DC
  Administered 2022-04-12 (×2): 100 mg via ORAL
  Filled 2022-04-11 (×2): qty 1

## 2022-04-11 MED ORDER — PROCHLORPERAZINE EDISYLATE 10 MG/2ML IJ SOLN
10.0000 mg | Freq: Four times a day (QID) | INTRAMUSCULAR | Status: DC | PRN
  Administered 2022-04-11 (×2): 10 mg via INTRAVENOUS
  Filled 2022-04-11 (×2): qty 2

## 2022-04-11 MED ORDER — THIAMINE HCL 100 MG/ML IJ SOLN
100.0000 mg | Freq: Every day | INTRAMUSCULAR | Status: DC
  Administered 2022-04-11: 100 mg via INTRAVENOUS
  Filled 2022-04-11: qty 2

## 2022-04-11 MED ORDER — LORAZEPAM 1 MG PO TABS: 1.0000 mg | ORAL_TABLET | ORAL | Status: DC | PRN

## 2022-04-11 MED ORDER — DIAZEPAM 5 MG PO TABS
2.5000 mg | ORAL_TABLET | Freq: Three times a day (TID) | ORAL | Status: DC
  Filled 2022-04-11: qty 1

## 2022-04-11 MED ORDER — UMECLIDINIUM BROMIDE 62.5 MCG/ACT IN AEPB
1.0000 | INHALATION_SPRAY | Freq: Every day | RESPIRATORY_TRACT | Status: DC
  Administered 2022-04-12: 1 via RESPIRATORY_TRACT
  Filled 2022-04-11: qty 7

## 2022-04-11 MED ORDER — DIPHENHYDRAMINE HCL 50 MG/ML IJ SOLN
12.5000 mg | Freq: Once | INTRAMUSCULAR | Status: AC
  Administered 2022-04-11: 12.5 mg via INTRAVENOUS
  Filled 2022-04-11: qty 1

## 2022-04-11 MED ORDER — LORAZEPAM 2 MG/ML IJ SOLN: 1.0000 mg | INTRAMUSCULAR | Status: DC | PRN

## 2022-04-11 MED ORDER — DIAZEPAM 5 MG PO TABS
5.0000 mg | ORAL_TABLET | Freq: Four times a day (QID) | ORAL | Status: DC
  Administered 2022-04-11 – 2022-04-12 (×5): 5 mg via ORAL
  Filled 2022-04-11 (×5): qty 1

## 2022-04-11 MED ORDER — ADULT MULTIVITAMIN W/MINERALS CH
1.0000 | ORAL_TABLET | Freq: Every day | ORAL | Status: DC
  Administered 2022-04-11 – 2022-04-12 (×2): 1 via ORAL
  Filled 2022-04-11 (×2): qty 1

## 2022-04-11 MED ORDER — LOPERAMIDE HCL 2 MG PO CAPS
2.0000 mg | ORAL_CAPSULE | Freq: Once | ORAL | Status: AC
  Administered 2022-04-11: 2 mg via ORAL
  Filled 2022-04-11: qty 1

## 2022-04-11 NOTE — Progress Notes (Shared)
? ?  Subjective: Pt was seen at bedside during rounds this morning. States that she feels better today. States that she does not take too many medications at one time. Is worried that her belongings are lost and does not know if her valium is here or at home. No other complaints or concerns at this time. ? ?Objective: ? ?Vital signs in last 24 hours: ?Vitals:  ? 04/10/22 2345 04/11/22 0000 04/11/22 0030 04/11/22 0717  ?BP: 115/64 128/71 102/64 115/83  ?Pulse: 65 71 71 65  ?Resp: 16 19 20 18   ?Temp:      ?TempSrc:      ?SpO2: 96% 96% 97% 99%  ?Weight:      ?Height:      ? ?General: NAD, nl appearance ?HE: Normocephalic, atraumatic, EOMI, Conjunctivae normal ?ENT: No congestion, no rhinorrhea, no exudate or erythema  ?Cardiovascular: Normal rate, regular rhythm. No murmurs, rubs, or gallops ?Pulmonary: Effort normal, breath sounds normal. No wheezes, rales, or rhonchi ?Abdominal: soft, nontender, bowel sounds present ?Musculoskeletal: no swelling, deformity, injury or tenderness in extremities ?Skin: Warm, dry, no bruising, erythema, or rash ?Psychiatric/Behavioral: normal mood, normal behavior    ? ?Assessment/Plan: ? ?Principal Problem: ?  Acute encephalopathy ? ?*** ?Prior to Admission Living Arrangement: ?Anticipated Discharge Location: ?Barriers to Discharge: ?Dispo: Anticipated discharge in approximately *** day(s).  ? ? , MD ?04/11/2022, 8:08 AM ?Pager: @MYPAGER @ ?After 5pm on weekdays and 1pm on weekends: On Call pager [phone] ? ?

## 2022-04-11 NOTE — Progress Notes (Signed)
? ?Subjective:  ?Patient continues to be intermittently confused per her son who is at bedside.  Though he notes that patient is much more lucid than when she first arrived to the ED.  Per patient she occasionally goes multiple days without taking her as needed Norco for her chronic back pain.  When she anticipates increased activity she will increase her Norco dose.  She notes that this could have potentially contributed to her confusion and agitation on admission.  Patient would like to go home but after discussion with her son she feels that 1 more night is appropriate.  Patient's son is concerned that she may become confused fall and had to come back to the ED. ? ?Patient continues to note that she has nausea and that Zofran is not effective.  She denied any back pain. She states she has been having diarrhea but the NT who is also serving as a Actuary states she has had 2 well formed, but soft stools.  ? ?Objective: ? ?Vital signs in last 24 hours: ?Vitals:  ? 04/11/22 0030 04/11/22 0717 04/11/22 1139 04/11/22 1454  ?BP: 102/64 115/83 118/77 123/77  ?Pulse: 71 65 70 69  ?Resp: 20 18 20 19   ?Temp:   97.7 ?F (36.5 ?C) 98.3 ?F (36.8 ?C)  ?TempSrc:   Oral Oral  ?SpO2: 97% 99% 97% 95%  ?Weight:      ?Height:      ? ? ?Constitutional: Well-developed, well-nourished, and in no distress.  Intermittently drowsy/confused. ?HENT:  ?Head: Normocephalic and atraumatic.  ?Eyes: EOM are normal.  ?Neck: Normal range of motion.  ?Cardiovascular: Normal rate, regular rhythm, intact distal pulses. No gallop and no friction rub.  ?No murmur heard. No lower extremity edema  ?Pulmonary: Non labored breathing on room air, no wheezing or rales  ?Abdominal: Soft. Normal bowel sounds. Non distended and non tender ?Musculoskeletal: Normal range of motion.     ?   General: No tenderness or edema.  ?Neurological: Alert and oriented to person, place, and time.  Patient did however ask if her son was interviewers younger brother.  She would  also occasionally bring up topics like meloxicam seemingly without any contacts.  At other times her conversation was clear.  Non focal  ?Skin: Skin is warm and dry.  ? ? ?Assessment/Plan: ? ?Principal Problem: ?  Acute encephalopathy ? ?Acute encephalopathy ?Polysubstance misuse ?Per patient and her son, she has been using chronic benzodiazepines and opioids for for about 20 to 30 years.  She occasionally takes increased doses of her opioids.  In addition, patient sometimes takes other substances including marijuana and cocaine in the past.  ?Discussed with patient that we could think about starting her on Suboxone however she states that this causes her to have nausea.  Also discussed with patient the possibility of starting methadone for substance use disorder as well as chronic pain in the outpatient setting, patient is not currently interested in this.  At this time, patient's mental state appears to be improving with time alone. She currently has a COWS of 8.  Do not suspect that her confusion on admission is related to any medical reasons currently.  ?-Will continue CIWA and COW monitoring ?-Resumed patient's home benzodiazepine given her persistent and regular use over the years.  ?-TOC for resources once discharged ? ?Leukocytosis ?Patient is without fever and has no symptoms of infection including no cough productive of purulent appearing sputum, dysuria or urinary frequency.  No meningismus. ?-We will continue to monitor  for now ? ?COPD, not in exacerbation ?-Continue home dulera and spiriva  ? ?Nocturnal hypoxia ?- Continue nightly 3.5 L supplemental oxygen ? ?Normocytic anemia.  Chronic and stable. B12 is being repleted.  ? ?Prior to Admission Living Arrangement: Home, [facility] ?Anticipated Discharge Location: Home ?Barriers to Discharge: Ability to care for herself given current mental state ?Dispo: Anticipated discharge in approximately 1 day(s).  ? ?Rick Duff, MD ?04/11/2022, 3:38 PM ?After  5pm on weekdays and 1pm on weekends: On Call pager [phone] ? ?

## 2022-04-11 NOTE — ED Notes (Signed)
The pt has been yelling crying and cursing she has pulled off her cords and she  wants to sget up  she wants phenergan she wants to call her mother or her father  she  wants phenergan  she reports that zofran does not work ?

## 2022-04-11 NOTE — ED Notes (Signed)
Staff tried to assit patient to restroom ,then she pull chux of bed place it in the floor bent down over the chux had a solid stool no diarrhea.place a bedside commode at bed side.she keeps crying out I need some nausea  meds.now holling out help me,we have been in the room several times.I have walk her to bathroom .She walked fine there and back ?

## 2022-04-12 ENCOUNTER — Other Ambulatory Visit (HOSPITAL_COMMUNITY): Payer: Self-pay

## 2022-04-12 DIAGNOSIS — T50905A Adverse effect of unspecified drugs, medicaments and biological substances, initial encounter: Secondary | ICD-10-CM | POA: Diagnosis not present

## 2022-04-12 DIAGNOSIS — G9340 Encephalopathy, unspecified: Secondary | ICD-10-CM | POA: Diagnosis not present

## 2022-04-12 LAB — BASIC METABOLIC PANEL
Anion gap: 9 (ref 5–15)
BUN: 7 mg/dL (ref 6–20)
CO2: 26 mmol/L (ref 22–32)
Calcium: 8.6 mg/dL — ABNORMAL LOW (ref 8.9–10.3)
Chloride: 106 mmol/L (ref 98–111)
Creatinine, Ser: 0.52 mg/dL (ref 0.44–1.00)
GFR, Estimated: >60 mL/min (ref >60)
Glucose, Bld: 119 mg/dL — ABNORMAL HIGH (ref 70–99)
Potassium: 3.5 mmol/L (ref 3.5–5.1)
Sodium: 141 mmol/L (ref 135–145)

## 2022-04-12 LAB — CBC WITH DIFFERENTIAL/PLATELET
Abs Immature Granulocytes: 0.08 10*3/uL — ABNORMAL HIGH (ref 0.00–0.07)
Basophils Absolute: 0 10*3/uL (ref 0.0–0.1)
Basophils Relative: 0 %
Eosinophils Absolute: 0.1 10*3/uL (ref 0.0–0.5)
Eosinophils Relative: 1 %
HCT: 36.4 % (ref 36.0–46.0)
Hemoglobin: 11.6 g/dL — ABNORMAL LOW (ref 12.0–15.0)
Immature Granulocytes: 1 %
Lymphocytes Relative: 31 %
Lymphs Abs: 3.4 10*3/uL (ref 0.7–4.0)
MCH: 29.4 pg (ref 26.0–34.0)
MCHC: 31.9 g/dL (ref 30.0–36.0)
MCV: 92.2 fL (ref 80.0–100.0)
Monocytes Absolute: 1.1 10*3/uL — ABNORMAL HIGH (ref 0.1–1.0)
Monocytes Relative: 10 %
Neutro Abs: 6.3 10*3/uL (ref 1.7–7.7)
Neutrophils Relative %: 57 %
Platelets: 320 10*3/uL (ref 150–400)
RBC: 3.95 MIL/uL (ref 3.87–5.11)
RDW: 14.5 % (ref 11.5–15.5)
WBC: 11 10*3/uL — ABNORMAL HIGH (ref 4.0–10.5)
nRBC: 0 % (ref 0.0–0.2)

## 2022-04-12 MED ORDER — NALOXONE HCL 4 MG/0.1ML NA LIQD
NASAL | 3 refills | Status: AC
  Filled 2022-04-12: qty 2, 30d supply, fill #0

## 2022-04-12 MED ORDER — LOPERAMIDE HCL 2 MG PO CAPS
2.0000 mg | ORAL_CAPSULE | Freq: Once | ORAL | Status: AC
  Administered 2022-04-12: 2 mg via ORAL
  Filled 2022-04-12: qty 1

## 2022-04-12 MED ORDER — SODIUM CHLORIDE 0.9 % IV SOLN
12.5000 mg | Freq: Once | INTRAVENOUS | Status: AC
  Administered 2022-04-12: 12.5 mg via INTRAVENOUS
  Filled 2022-04-12: qty 0.5

## 2022-04-12 NOTE — Progress Notes (Signed)
CSW informed that pt needs to be released from IVC, change in commitment form filled out, signed by Dr Montez Morita, faxed to Cathy/[facility] at [facility] of court, hard copy placed in chart. ?Daleen Squibb, MSW, LCSW ?5/15/20232:08 PM  ?

## 2022-04-12 NOTE — Plan of Care (Signed)
?  Problem: Education: ?Goal: Knowledge of General Education information will improve ?Description: Including pain rating scale, medication(s)/side effects and non-pharmacologic comfort measures ?04/12/2022 0036 by Claire Shown, RN ?Outcome: Progressing ?04/12/2022 0035 by Claire Shown, RN ?Outcome: Progressing ?  ?Problem: Health Behavior/Discharge Planning: ?Goal: Ability to manage health-related needs will improve ?04/12/2022 0036 by Claire Shown, RN ?Outcome: Progressing ?04/12/2022 0035 by Claire Shown, RN ?Outcome: Progressing ?  ?Problem: Clinical Measurements: ?Goal: Ability to maintain clinical measurements within normal limits will improve ?04/12/2022 0036 by Claire Shown, RN ?Outcome: Progressing ?04/12/2022 0035 by Claire Shown, RN ?Outcome: Progressing ?Goal: Will remain free from infection ?04/12/2022 0036 by Claire Shown, RN ?Outcome: Progressing ?04/12/2022 0035 by Claire Shown, RN ?Outcome: Progressing ?Goal: Diagnostic test results will improve ?04/12/2022 0036 by Claire Shown, RN ?Outcome: Progressing ?04/12/2022 0035 by Claire Shown, RN ?Outcome: Progressing ?Goal: Respiratory complications will improve ?04/12/2022 0036 by Claire Shown, RN ?Outcome: Progressing ?04/12/2022 0035 by Claire Shown, RN ?Outcome: Progressing ?Goal: Cardiovascular complication will be avoided ?04/12/2022 0036 by Claire Shown, RN ?Outcome: Progressing ?04/12/2022 0035 by Claire Shown, RN ?Outcome: Progressing ?  ?Problem: Activity: ?Goal: Risk for activity intolerance will decrease ?04/12/2022 0036 by Claire Shown, RN ?Outcome: Progressing ?04/12/2022 0035 by Claire Shown, RN ?Outcome: Progressing ?  ?Problem: Nutrition: ?Goal: Adequate nutrition will be maintained ?04/12/2022 0036 by Claire Shown, RN ?Outcome: Progressing ?04/12/2022 0035 by Claire Shown, RN ?Outcome: Progressing ?  ?

## 2022-04-12 NOTE — Discharge Summary (Signed)
? ?Name: Debra [name] ?MRN: [id number] ?DOB: 04/29/1966 56 y.o. ?PCP: [name], MD ? ?Date of Admission: 04/10/2022  2:16 PM ?Date of Discharge: No discharge date for patient encounter. ?Attending Physician: Gust Rung, DO ? ?Discharge Diagnosis: ?1. Medication misuse leading to AMS, opioid and benzodiazepines  ? ?Discharge Medications: ?Allergies as of 04/12/2022   ? ?   Reactions  ? Cefaclor Hives, Diarrhea, Rash  ? Sertraline   ? Suicidal thoughts, insomnia, nightmares  ? Statins Dermatitis, Rash  ? Multiple ecchymosis areas on all extremities.  ? Amoxicillin-pot Clavulanate Diarrhea  ? Has patient had a PCN reaction causing immediate rash, facial/tongue/throat swelling, SOB or lightheadedness with hypotension: No ?Has patient had a PCN reaction causing severe rash involving mucus membranes or skin necrosis: No ?Has patient had a PCN reaction that required hospitalization: No ?Has patient had a PCN reaction occurring within the last 10 years: Yes ?If all of the above answers are "NO", then may proceed with Cephalosporin use.  ? Buspar [buspirone] Nausea And Vomiting  ? Codeine Nausea And Vomiting  ? If pt takes a lot of this med  ? Latex Itching, Swelling  ? redness  ? Levaquin [levofloxacin] Swelling  ? Can take up to 500 mg a day  ? Nsaids   ? Bleeding ulcers  ? Ciprofloxacin Nausea Only, Rash  ? Room was spinning  ? ?  ? ?  ?Medication List  ?  ? ?STOP taking these medications   ? ?potassium chloride 10 MEQ tablet ?Commonly known as: KLOR-CON M ?  ? ?  ? ?TAKE these medications   ? ?acetaminophen 500 MG tablet ?Commonly known as: TYLENOL ?Take 500 mg by mouth every 6 (six) hours as needed for moderate pain. ?  ?atorvastatin 40 MG tablet ?Commonly known as: LIPITOR ?Take 40 mg by mouth daily. ?  ?citalopram 40 MG tablet ?Commonly known as: CELEXA ?Take 40 mg by mouth daily. ?  ?diazepam 5 MG tablet ?Commonly known as: VALIUM ?Take 5 mg by mouth every 6 (six) hours as needed for anxiety. ?  ?Dulera  200-5 MCG/ACT Aero ?Generic drug: mometasone-formoterol ?Inhale 2 puffs into the lungs 2 (two) times daily. ?  ?fexofenadine 60 MG tablet ?Commonly known as: ALLEGRA ?Take 60 mg by mouth daily. ?  ?fluticasone 50 MCG/ACT nasal spray ?Commonly known as: FLONASE ?Place 1 spray into both nostrils 2 (two) times daily. ?  ?loratadine 10 MG tablet ?Commonly known as: CLARITIN ?Take 10 mg by mouth daily as needed for allergies. ?  ?metFORMIN 500 MG 24 hr tablet ?Commonly known as: GLUCOPHAGE-XR ?Take 500 mg by mouth 2 (two) times daily. ?  ?metoprolol tartrate 50 MG tablet ?Commonly known as: LOPRESSOR ?Take 50 mg by mouth 2 (two) times daily. ?  ?omeprazole 40 MG capsule ?Commonly known as: PRILOSEC ?Take 40 mg by mouth daily. ?  ?oxyCODONE-acetaminophen 5-325 MG tablet ?Commonly known as: PERCOCET/ROXICET ?Take 1 tablet by mouth every 6 (six) hours as needed for severe pain. ?  ?pregabalin 100 MG capsule ?Commonly known as: LYRICA ?Take 100 mg by mouth 3 (three) times daily. ?  ?ProAir HFA 108 (90 Base) MCG/ACT inhaler ?Generic drug: albuterol ?Inhale 2 puffs into the lungs every 4 (four) hours as needed for wheezing or shortness of breath. ?  ?Spiriva Respimat 2.5 MCG/ACT Aers ?Generic drug: Tiotropium Bromide Monohydrate ?Inhale 2 puffs into the lungs daily. ?  ? ?  ? ? ?Disposition and follow-up:   ?Ms.Brezlyn Manrique was discharged from [facility]  Hospital in Stable condition.  At the hospital follow up visit please address: ? ?1.  Chronic opioid and benzodiazepine use, initiating methadone or buprenorphine, Seeing pain psychologist at [facility] where cardiologist is located.  ? ?2.  Labs / imaging needed at time of follow-up: Vitamin B12 levels after 7-10d ? ?3.  Pending labs/ test needing follow-up: None  ? ?Follow-up Appointments: ?PCP in 1-2 weeks.  ? ?Hospital Course by problem list: ?1. Acute encephalopathy (Medication misuse)  ?Patient has been on chronic opioids for back pain after multiple spinal fusion  surgeries. In addition she has been on chronic benzodiazepine therapy for ~20-30 years. She also uses marijuana currently and has used cocaine in the distant past. Patient states that if she knows she will participate in a physically demanding task that she will sometimes take more pain medication (oxycodone 5mg -325mg  APAP) than usual. In addition, she states that she takes her valium  scheduled. Patient presented to the hospital confused, agitated and violent. She required IVC. Her mental status gradually improved. She had a maximum COWS score of 8. She did have some GI upset but otherwise no other symptoms of withdrawal. TOC was consulted for her medication misuse. In addition, the IMTS team discussed with patient alternatives for her pain that could help with her likely addiction and pain. Also gave patient [facility]  ? ?2. Leukocytosis  ?Unclear significance, neutrophilic predominance. She had no fever or symptoms. No antibiotics were started. Her leukocytosis improved to near normal by the time of discharge.  ? ?3. COPD  ?Patient was not in acute exacerbation and was continued on her home medications.  ? ?4. Normocytic anemia: Chronic and stable. Vitamin B12 was low and was repleted. She was instructed to continue a multivitamin on discharge.  ? ?Subjective:  ?On the day of discharge, patient was back to her baseline level of mentation. She did note that she was having multiple bowel movements that improved with imodium. Patient also noted that she continued to have some nausea but that this has improved with compazine. She has been able to keep down fluids and some some solids. She feels back to her baseline.  ? ?Discharge Exam:   ?BP 131/69 (BP Location: Right Arm)   Pulse 66   Temp 97.6 ?F (36.4 ?C) (Oral)   Resp 18   Ht 5\' 10"  (1.778 m)   Wt 73 kg   SpO2 97%   BMI 23.10 kg/m?  ?Discharge exam:  ? ?Constitutional: Well-developed, well-nourished,  and in no distress.  ?HENT:  ?Head: Normocephalic and atraumatic.  ?Eyes: EOM are normal.  ?Neck: Normal range of motion.  ?Cardiovascular: Normal rate, regular rhythm, intact distal pulses. No gallop and no friction rub.  ?No murmur heard. No lower extremity edema  ?Pulmonary: Non labored breathing on room air, no wheezing or rales  ?Abdominal: Soft. Normal bowel sounds. Non distended and non tender ?Musculoskeletal: Normal range of motion.     ?   General: No tenderness or edema.  ?Neurological: Alert and oriented to person, place, and time. Non focal  ?Skin: Skin is warm and dry.  ? ? ?Pertinent Labs, Studies, and Procedures:  ?Component Ref Range & Units 2 d ago  ?Vitamin B-12 180 - 914 pg/mL 175 Low    ? ?Component Ref Range & Units 01:50 ?(04/12/22) 2 d ago ?(04/10/22) 1 yr ago ?(12/01/20) 1 yr ago ?(11/30/20) 1 yr ago ?(11/29/20) 1 yr ago ?(11/28/20) 1 yr ago ?(11/27/20)  ?  Sodium 135 - 145 mmol/L 141  139  139   139  140  141   ?Potassium 3.5 - 5.1 mmol/L 3.5  3.7  3.7   3.9  3.4 Low  CM  4.3 CM   ?Chloride 98 - 111 mmol/L 106  99  101   99  101  101   ?CO2 22 - 32 mmol/L 26  32  26   29  29  28    ?Glucose, Bld 70 - 99 mg/dL 161119 High   096112 High  CM  133 High  CM   138 High  CM  92 CM  123 High  CM   ?Comment: Glucose reference range applies only to samples taken after fasting for at least 8 hours.  ?BUN 6 - 20 mg/dL 7  16  15   15  16  12    ?Creatinine, Ser 0.44 - 1.00 mg/dL 0.450.52  4.090.54  8.110.48  9.140.42 Low   0.41 Low   0.39 Low   0.39 Low    ?Calcium 8.9 - 10.3 mg/dL 8.6 Low   8.8 Low   8.7 Low    8.7 Low   8.5 Low   8.1 Low    ?GFR, Estimated >60 mL/min >60  >60 CM  >60 CM  >60 CM  >60 CM  >60 CM  >60 CM   ?Comment: (NOTE)  ?Calculated using the CKD-EPI Creatinine Equation (2021)   ?Anion gap 5 - 15 9  8  CM  12 CM   11 CM  10 CM  12 CM   ? ?Component Ref Range & Units 01:50 ?(04/12/22) 2 d ago ?(04/10/22) 1 yr ago ?(12/02/20) 1 yr ago ?(12/01/20) 1 yr ago ?(11/29/20) 1 yr ago ?(11/28/20) 1 yr ago ?(11/27/20)  ?WBC 4.0 - 10.5  K/uL 11.0 High   15.1 High   14.9 High   15.4 High   9.1  8.6  5.3   ?RBC 3.87 - 5.11 MIL/uL 3.95  3.97  4.76  4.55  4.19  3.79 Low   3.30 Low    ?Hemoglobin 12.0 - 15.0 g/dL 78.211.6 Low   95.611.5 Low   21.312.9  12.7  11.4 Low

## 2022-04-12 NOTE — Progress Notes (Shared)
? ?  Subjective:  ?Patient states that she is feeling better today. States that her son visited her yesterday. Able to state name, current location, year, and current president. She is wanting to leave today. Has been hesitant to eat due to nausea and not wanting to have more diarrhea. Had ~15 BM last night. Requesting medication for diarrhea today. No other complaints or concerns at this time.  ? ?Objective: ? ?Vital signs in last 24 hours: ?Vitals:  ? 04/11/22 2132 04/11/22 2215 04/11/22 2335 04/12/22 0720  ?BP: 97/67  125/62 138/75  ?Pulse: 80  77 82  ?Resp:  18 16 18   ?Temp: 98 ?F (36.7 ?C) (!) 97.5 ?F (36.4 ?C) 97.7 ?F (36.5 ?C) 97.7 ?F (36.5 ?C)  ?TempSrc: Oral Oral  Oral  ?SpO2: 98%  96% 94%  ?Weight:      ?Height:      ? ? ?Constitutional: Well-developed, well-nourished, and in no distress.  Intermittently drowsy/confused. ?HENT:  ?Head: Normocephalic and atraumatic.  ?Eyes: EOM are normal.  ?Neck: Normal range of motion.  ?Cardiovascular: Normal rate, regular rhythm, intact distal pulses. No gallop and no friction rub.  ?No murmur heard. No lower extremity edema  ?Pulmonary: Non labored breathing on room air, no wheezing or rales  ?Abdominal: Soft. Normal bowel sounds. Non distended and non tender ?Musculoskeletal: Normal range of motion.     ?   General: No tenderness or edema.  ?Neurological: Alert and oriented to person, place, and time.  Patient did however ask if her son was interviewers younger brother.  She would also occasionally bring up topics like meloxicam seemingly without any contacts.  At other times her conversation was clear.  Non focal  ?Skin: Skin is warm and dry.  ? ? ?Assessment/Plan: ? ?Principal Problem: ?  Acute encephalopathy ? ?Acute encephalopathy ?Polysubstance misuse ?Per patient and her son, she has been using chronic benzodiazepines and opioids for for about 20 to 30 years.  She occasionally takes increased doses of her opioids.  In addition, patient sometimes takes other  substances including marijuana and cocaine in the past.  ?Discussed with patient that we could think about starting her on Suboxone however she states that this causes her to have nausea.  Also discussed with patient the possibility of starting methadone for substance use disorder as well as chronic pain in the outpatient setting, patient is not currently interested in this.  At this time, patient's mental state appears to be improving with time alone. She currently has a COWS of 8.  Do not suspect that her confusion on admission is related to any medical reasons currently.  ?-Will continue CIWA and COW monitoring ?-Resumed patient's home benzodiazepine given her persistent and regular use over the years.  ?-TOC for resources once discharged ? ?Leukocytosis ?Patient is without fever and has no symptoms of infection including no cough productive of purulent appearing sputum, dysuria or urinary frequency.  No meningismus. ?-We will continue to monitor for now ? ?COPD, not in exacerbation ?-Continue home dulera and spiriva  ? ?Nocturnal hypoxia ?- Continue nightly 3.5 L supplemental oxygen ? ?Normocytic anemia.  Chronic and stable. B12 is being repleted.  ? ?Prior to Admission Living Arrangement: Home, [facility] ?Anticipated Discharge Location: Home ?Barriers to Discharge: Ability to care for herself given current mental state ?Dispo: Anticipated discharge in approximately 1 day(s).  ? ? , MD ?04/12/2022, 10:08 AM ?After 5pm on weekdays and 1pm on weekends: On Call pager [phone] ? ?

## 2022-04-12 NOTE — Discharge Instructions (Addendum)
For your chronic back pain there are other team members of the pain management team who may be able to offer some methods to help you cope with your pain and prevent you from escalating your opioid use. I have attached the number below.  ? ?[facility] ?[phone] ? ?[address] ?[address] ?[place], [place] [address] ? ?Please also take a multivitamin that includes vitamin B12 and folate as your levels were low this hospitalization.  ?

## 2022-04-12 NOTE — Plan of Care (Signed)

## 2022-04-13 LAB — HOMOCYSTEINE: Homocysteine: 8.3 umol/L (ref 0.0–14.5)

## 2022-04-15 LAB — CULTURE, BLOOD (ROUTINE X 2)
Culture: NO GROWTH
Special Requests: ADEQUATE

## 2022-04-16 LAB — CULTURE, BLOOD (ROUTINE X 2): Culture: NO GROWTH

## 2022-04-18 LAB — METHYLMALONIC ACID, SERUM: Methylmalonic Acid, Quantitative: 253 nmol/L (ref 0–378)

## 2022-04-22 ENCOUNTER — Telehealth: Payer: Self-pay | Admitting: Student

## 2022-04-22 ENCOUNTER — Other Ambulatory Visit: Payer: Self-pay | Admitting: Student

## 2022-04-22 DIAGNOSIS — E538 Deficiency of other specified B group vitamins: Secondary | ICD-10-CM

## 2022-04-22 MED ORDER — VITAMIN B-12 1000 MCG PO TABS: 1000.0000 ug | ORAL_TABLET | Freq: Every day | ORAL | 0 refills | Status: DC

## 2022-04-22 NOTE — Telephone Encounter (Signed)
Left voicemail regarding her vitamin b12 deficiency and that she should pick up a prescription for this at her pharmacy. Tried calling x2.

## 2022-05-07 ENCOUNTER — Other Ambulatory Visit: Payer: Self-pay

## 2022-05-10 ENCOUNTER — Encounter: Payer: Self-pay | Admitting: Pulmonary Disease

## 2022-05-10 ENCOUNTER — Ambulatory Visit (INDEPENDENT_AMBULATORY_CARE_PROVIDER_SITE_OTHER): Payer: Medicaid Other | Admitting: Pulmonary Disease

## 2022-05-10 VITALS — BP 100/66 | HR 77 | Temp 98.2°F | Ht 67.0 in | Wt 166.8 lb

## 2022-05-10 DIAGNOSIS — J449 Chronic obstructive pulmonary disease, unspecified: Secondary | ICD-10-CM | POA: Diagnosis not present

## 2022-05-10 DIAGNOSIS — J9611 Chronic respiratory failure with hypoxia: Secondary | ICD-10-CM

## 2022-05-10 DIAGNOSIS — R911 Solitary pulmonary nodule: Secondary | ICD-10-CM | POA: Diagnosis not present

## 2022-05-10 NOTE — Progress Notes (Signed)
[facility]  Chief Complaint  Patient presents with   Follow-up    Patient feels like she has more trouble breathing the hotter it gets outside.     Past Surgical History:  She  has a past surgical history that includes Tonsillectomy; Upper gastrointestinal endoscopy (1994); Anterior cervical decomp/discectomy fusion (N/A, 05/18/2017); Esophagogastroduodenoscopy (N/A, 07/14/2017); Posterior lumbar fusion (06/07/2018); Anterior lumbar fusion (2013); and Vaginal hysterectomy (1997).  Past Medical History:  Anxiety, HH, PUD, GERD, Depression, COPD, Low back pain, Arthritis   Constitutional:  BP 100/66 (BP Location: Left Arm, Patient Position: Sitting, Cuff Size: Normal)   Pulse 77   Temp 98.2 F (36.8 C) (Oral)   Ht _0  (1.702 m)   Wt 166 lb 12.8 oz (75.7 kg)   SpO2 95%   BMI 26.12 kg/m   Brief Summary:  Debra [name] is a 56 y.o. female smoker with COPD, lung nodule and chronic respiratory failure.      Subjective:   Breathing was rough last week when we had the [employer] smoke in the area.  She can't walk through the whole grocery store without having to rest or use a buggy.  She has occasional cough and phlegm.  Uses 2 to 3 liters oxygen with exertion and sleep.    Palpitations better.  No having hemoptysis or leg swelling.  Physical Exam:   Appearance - well kempt   ENMT - no sinus tenderness, no oral exudate, no LAN, Mallampati 3 airway, no stridor  Respiratory - equal breath sounds bilaterally, no wheezing or rales  CV - s1s2 regular rate and rhythm, no murmurs  Ext - no clubbing, no edema  Skin - no rashes  Psych - normal mood and affect      Pulmonary testing:  PFT 08/30/18 >> FEV1 1.47 (49%), FEV1% 49, TLC 7.08 (132%), DLCO 42% A1AT 08/30/18 >> 155, MM  Chest Imaging:  CT chest 02/07/20 >> 4 mm nodule LUL, mild paraseptal and centrilobular emphysema CT angio chest 11/23/20 >> RLL consolidation  with patchy GGO b/l CT chest 03/03/22 >> atherosclerosis, mild paraseptal and centrilobular emphysema, bronchial wall thickening, LUL nodule stable  Cardiac Tests:  Echo 07/01/21 >> EF 55 to 60%  Social History:  She  reports that she has been smoking cigarettes. She has a 17.50 pack-year smoking history. She has never used smokeless tobacco. She reports current alcohol use. She reports that she does not currently use drugs.  Family History:  Her family history includes COPD in her mother; Heart attack in her paternal grandfather and paternal uncle; Heart disease in her father; Hypertension in her mother; Ulcers in her father.     Assessment/Plan:   COPD with emphysema and chronic bronchitis. - breztri and dry powder inhalers caused throat irritation and thrush - continue spiriva and dulera - prn albuterol - completed handicap parking form  Chronic respiratory failure with hypoxia. - continue 2 to 3 liters oxygen with exertion and sleep - goal SpO2 > 90%   Lt upper lung nodule. - stable on serial CT imaging, and can be considered benign - she doesn't need follow up imaging for this   Tobacco abuse. - reviewed options to help with smoking cessation  Tachycardia. - followed by Dr. Jacques Earthly with [facility]  Time Spent Involved in Patient Care on Day of Examination:  38 minutes  Follow up:   Patient Instructions  Follow up in 6 months  Medication List:   Allergies as  of 05/10/2022       Reactions   Cefaclor Hives, Diarrhea, Rash   Sertraline    Suicidal thoughts, insomnia, nightmares   Statins Dermatitis, Rash   Multiple ecchymosis areas on all extremities.   Amoxicillin-pot Clavulanate Diarrhea   Has patient had a PCN reaction causing immediate rash, facial/tongue/throat swelling, SOB or lightheadedness with hypotension: No Has patient had a PCN reaction causing severe rash involving mucus membranes or skin necrosis: No Has patient had a PCN reaction that  required hospitalization: No Has patient had a PCN reaction occurring within the last 10 years: Yes If all of the above answers are "NO", then may proceed with Cephalosporin use.   Buspar [buspirone] Nausea And Vomiting   Codeine Nausea And Vomiting   If pt takes a lot of this med   Latex Itching, Swelling   redness   Levaquin [levofloxacin] Swelling   Can take up to 500 mg a day   Nsaids    Bleeding ulcers   Ciprofloxacin Nausea Only, Rash   Room was spinning        Medication List        Accurate as of May 10, 2022  1:41 PM. If you have any questions, ask your nurse or doctor.          acetaminophen 500 MG tablet Commonly known as: TYLENOL Take 500 mg by mouth every 6 (six) hours as needed for moderate pain.   atorvastatin 40 MG tablet Commonly known as: LIPITOR Take 40 mg by mouth daily.   citalopram 40 MG tablet Commonly known as: CELEXA Take 40 mg by mouth daily.   diazepam 5 MG tablet Commonly known as: VALIUM Take 5 mg by mouth every 6 (six) hours as needed for anxiety.   Dulera 200-5 MCG/ACT Aero Generic drug: mometasone-formoterol Inhale 2 puffs into the lungs 2 (two) times daily.   fexofenadine 60 MG tablet Commonly known as: ALLEGRA Take 60 mg by mouth daily.   fluticasone 50 MCG/ACT nasal spray Commonly known as: FLONASE Place 1 spray into both nostrils 2 (two) times daily.   loratadine 10 MG tablet Commonly known as: CLARITIN Take 10 mg by mouth daily as needed for allergies.   metFORMIN 500 MG 24 hr tablet Commonly known as: GLUCOPHAGE-XR Take 500 mg by mouth 2 (two) times daily.   metoprolol tartrate 50 MG tablet Commonly known as: LOPRESSOR Take 50 mg by mouth 2 (two) times daily.   naloxone 4 MG/0.1ML Liqd nasal spray kit Commonly known as: NARCAN Use 1 spray into one nostril. May use another spray into the other nostril every 2 to 3 minutes until the patient responds or until EMS arrives.   omeprazole 40 MG capsule Commonly  known as: PRILOSEC Take 40 mg by mouth daily.   oxyCODONE-acetaminophen 5-325 MG tablet Commonly known as: PERCOCET/ROXICET Take 1 tablet by mouth every 6 (six) hours as needed for severe pain.   pregabalin 100 MG capsule Commonly known as: LYRICA Take 100 mg by mouth 3 (three) times daily.   ProAir HFA 108 (90 Base) MCG/ACT inhaler Generic drug: albuterol Inhale 2 puffs into the lungs every 4 (four) hours as needed for wheezing or shortness of breath.   Spiriva Respimat 2.5 MCG/ACT Aers Generic drug: Tiotropium Bromide Monohydrate Inhale 2 puffs into the lungs daily.   vitamin B-12 1000 MCG tablet Commonly known as: CYANOCOBALAMIN Take 1 tablet (1,000 mcg total) by mouth daily.        Signature:  Chesley Mires, MD  [place] Pager - [phone] 05/10/2022, 1:41 PM

## 2022-05-10 NOTE — Patient Instructions (Signed)
Follow up in 6 months 

## 2022-05-19 ENCOUNTER — Encounter (INDEPENDENT_AMBULATORY_CARE_PROVIDER_SITE_OTHER): Payer: Self-pay | Admitting: *Deleted

## 2022-05-20 ENCOUNTER — Telehealth: Payer: Self-pay | Admitting: Pulmonary Disease

## 2022-05-20 MED ORDER — PREDNISONE 10 MG PO TABS: ORAL_TABLET | ORAL | 0 refills | Status: DC

## 2022-05-20 NOTE — Telephone Encounter (Signed)
Called patient patient and informed her that Dr Thora Lance had sent in a prednisone taper pack to her pharmacy. Advised her that if she is still feeling bad after taper pack to call us for an appt. Nothing further needed

## 2022-05-20 NOTE — Telephone Encounter (Signed)
Prescription for prednisone taper sent in to [facility] in [place]

## 2022-05-20 NOTE — Telephone Encounter (Signed)
Called patient and she is stating that she is having issues breathing. Pt states she is on 3.5L of continuous oxygen. She states that she feels like her lungs are closing up on her when she is up walking around. She states that this started to occur 4 days ago. Pt just saw Dr Craige Cotta last week and she was fine. Pt states that she would like prednisone called in to see if this helps her.   Dr Thora Lance please advise since Dr Craige Cotta is done at 1pm today

## 2022-06-07 ENCOUNTER — Telehealth: Payer: Self-pay | Admitting: Pulmonary Disease

## 2022-06-07 MED ORDER — PREDNISONE 20 MG PO TABS
ORAL_TABLET | ORAL | 0 refills | Status: AC
Start: 2022-06-07 — End: 2022-06-13

## 2022-06-07 MED ORDER — LEVOFLOXACIN 500 MG PO TABS: 500.0000 mg | ORAL_TABLET | Freq: Every day | ORAL | 0 refills | Status: AC

## 2022-06-07 NOTE — Telephone Encounter (Signed)
Called and spoke to patient. Notified her of recommendations and that orders were placed for medications. Patient states she will call next Monday and let us know how she is feeling. Nothing further needed at this time.

## 2022-06-07 NOTE — Telephone Encounter (Signed)
Okay to send script for prednisone 20 mg >> 3 pills on day 1, then 2 pills daily for next 5 days.  Okay to send script for levaquin 500 mg daily of 7 days.

## 2022-06-07 NOTE — Telephone Encounter (Signed)
Primary Pulmonologist: Dr. Halford Chessman  Last office visit and with whom: 05/10/2022 Dr. Halford Chessman  What do we see them for (pulmonary problems): COPD mixed type  Last OV assessment/plan: see below   Was appointment offered to patient (explain)?  None available in [facility] office; was seen on 05/10/2022     Reason for call: Patient called and is not feeling well. She states she has a sore throat and informed Dr. Halford Chessman of this. She states since her visit she has had 2 more incidents of sore throat. She states it feels like she has swallowed glass and states she did not have a voice and it is hurting her to talk today. She is concerned she has lung infection. PCP prescribes prednisone 60 mg day one; 40 for 5 days and then stop. She states this always helps.  She states Dr. Verlee Monte prescribed prednisone taper differently and she states it did not help and wants to know if she can have prednisone prescribed the way PCP does and a 5-7 day levaquin (specifically) prescription.   Please advise   Allergies  Allergen Reactions   Cefaclor Hives, Diarrhea and Rash   Sertraline     Suicidal thoughts, insomnia, nightmares   Statins Dermatitis and Rash    Multiple ecchymosis areas on all extremities.   Amoxicillin-Pot Clavulanate Diarrhea    Has patient had a PCN reaction causing immediate rash, facial/tongue/throat swelling, SOB or lightheadedness with hypotension: No Has patient had a PCN reaction causing severe rash involving mucus membranes or skin necrosis: No Has patient had a PCN reaction that required hospitalization: No Has patient had a PCN reaction occurring within the last 10 years: Yes If all of the above answers are "NO", then may proceed with Cephalosporin use.    Buspar [Buspirone] Nausea And Vomiting   Codeine Nausea And Vomiting    If pt takes a lot of this med   Latex Itching and Swelling    redness   Levaquin [Levofloxacin] Swelling    Can take up to 500 mg a day   Nsaids     Bleeding ulcers    Ciprofloxacin Nausea Only and Rash    Room was spinning    Immunization History  Administered Date(s) Administered   Influenza,inj,Quad PF,6+ Mos 11/10/2021   Moderna Covid-19 Vaccine Bivalent Booster 50yr & up 11/10/2021   Moderna Sars-Covid-2 Vaccination 02/26/2020, 03/26/2020, 03/10/2021    Assessment/Plan:    COPD with emphysema and chronic bronchitis. - breztri and dry powder inhalers caused throat irritation and thrush - continue spiriva and dulera - prn albuterol - completed handicap parking form   Chronic respiratory failure with hypoxia. - continue 2 to 3 liters oxygen with exertion and sleep - goal SpO2 > 90%   Lt upper lung nodule. - stable on serial CT imaging, and can be considered benign - she doesn't need follow up imaging for this   Tobacco abuse. - reviewed options to help with smoking cessation   Tachycardia. - [facility]   Time Spent Involved in Patient Care on Day of Examination:  38 minutes   Follow up:    Patient Instructions  Follow up in 6 months   Medication List:    Allergies as of 05/10/2022         Reactions    Cefaclor Hives, Diarrhea, Rash    Sertraline      Suicidal thoughts, insomnia, nightmares    Statins Dermatitis, Rash    Multiple ecchymosis areas  on all extremities.    Amoxicillin-pot Clavulanate Diarrhea    Has patient had a PCN reaction causing immediate rash, facial/tongue/throat swelling, SOB or lightheadedness with hypotension: No Has patient had a PCN reaction causing severe rash involving mucus membranes or skin necrosis: No Has patient had a PCN reaction that required hospitalization: No Has patient had a PCN reaction occurring within the last 10 years: Yes If all of the above answers are "NO", then may proceed with Cephalosporin use.    Buspar [buspirone] Nausea And Vomiting    Codeine Nausea And Vomiting    If pt takes a lot of this med    Latex Itching, Swelling     redness    Levaquin [levofloxacin] Swelling    Can take up to 500 mg a day    Nsaids      Bleeding ulcers    Ciprofloxacin Nausea Only, Rash    Room was spinning            Medication List           Accurate as of May 10, 2022  1:41 PM. If you have any questions, ask your nurse or doctor.              acetaminophen 500 MG tablet Commonly known as: TYLENOL Take 500 mg by mouth every 6 (six) hours as needed for moderate pain.    atorvastatin 40 MG tablet Commonly known as: LIPITOR Take 40 mg by mouth daily.    citalopram 40 MG tablet Commonly known as: CELEXA Take 40 mg by mouth daily.    diazepam 5 MG tablet Commonly known as: VALIUM Take 5 mg by mouth every 6 (six) hours as needed for anxiety.    Dulera 200-5 MCG/ACT Aero Generic drug: mometasone-formoterol Inhale 2 puffs into the lungs 2 (two) times daily.    fexofenadine 60 MG tablet Commonly known as: ALLEGRA Take 60 mg by mouth daily.    fluticasone 50 MCG/ACT nasal spray Commonly known as: FLONASE Place 1 spray into both nostrils 2 (two) times daily.    loratadine 10 MG tablet Commonly known as: CLARITIN Take 10 mg by mouth daily as needed for allergies.    metFORMIN 500 MG 24 hr tablet Commonly known as: GLUCOPHAGE-XR Take 500 mg by mouth 2 (two) times daily.    metoprolol tartrate 50 MG tablet Commonly known as: LOPRESSOR Take 50 mg by mouth 2 (two) times daily.    naloxone 4 MG/0.1ML Liqd nasal spray kit Commonly known as: NARCAN Use 1 spray into one nostril. May use another spray into the other nostril every 2 to 3 minutes until the patient responds or until EMS arrives.    omeprazole 40 MG capsule Commonly known as: PRILOSEC Take 40 mg by mouth daily.    oxyCODONE-acetaminophen 5-325 MG tablet Commonly known as: PERCOCET/ROXICET Take 1 tablet by mouth every 6 (six) hours as needed for severe pain.    pregabalin 100 MG capsule Commonly known as: LYRICA Take 100 mg by mouth 3 (three)  times daily.    ProAir HFA 108 (90 Base) MCG/ACT inhaler Generic drug: albuterol Inhale 2 puffs into the lungs every 4 (four) hours as needed for wheezing or shortness of breath.    Spiriva Respimat 2.5 MCG/ACT Aers Generic drug: Tiotropium Bromide Monohydrate Inhale 2 puffs into the lungs daily.    vitamin B-12 1000 MCG tablet Commonly known as: CYANOCOBALAMIN Take 1 tablet (1,000 mcg total) by mouth daily.  Signature:  Chesley Mires, MD [place] Pager - [phone] 05/10/2022, 1:41 PM

## 2022-06-18 ENCOUNTER — Telehealth: Payer: Self-pay | Admitting: Pulmonary Disease

## 2022-06-18 NOTE — Telephone Encounter (Signed)
Pt called to let VS know how she was doing after recent meds. States that the beginning of the week she had been doing good but states now she is having a lot of burning in her lungs and believes that she might need to have to go to the hospital to have IV meds.  Stated to pt if she felt like she needed to go to the hospital, that is probably the best thing for her especially with the weekend coming up. Pt also stated that she fell earlier this week and believes she may have broken some ribs and wonders if this could be why she is having some of her pain.  Restated to pt if she felt like she needed to go to hospital that that is where she needs to go and she verbalized understanding. Nothing further needed.

## 2022-07-30 ENCOUNTER — Telehealth: Payer: Self-pay | Admitting: Pulmonary Disease

## 2022-07-30 MED ORDER — DULERA 200-5 MCG/ACT IN AERO
2.0000 | INHALATION_SPRAY | Freq: Two times a day (BID) | RESPIRATORY_TRACT | 3 refills | Status: DC
Start: 2022-07-30 — End: 2022-10-27

## 2022-07-30 MED ORDER — SPIRIVA RESPIMAT 2.5 MCG/ACT IN AERS: 2.0000 | INHALATION_SPRAY | Freq: Every day | RESPIRATORY_TRACT | 3 refills | Status: DC

## 2022-07-30 NOTE — Telephone Encounter (Signed)
[facility]ills [facility] [place]. Nothing further needed at this time.

## 2022-10-18 ENCOUNTER — Telehealth: Payer: Self-pay | Admitting: Pulmonary Disease

## 2022-10-18 MED ORDER — PREDNISONE 20 MG PO TABS: 20.0000 mg | ORAL_TABLET | Freq: Every day | ORAL | 0 refills | Status: DC

## 2022-10-18 NOTE — Telephone Encounter (Signed)
Called and spoke with pt letting her know recs per VS and she verbalized understanding. Rx for prednisone has been sent to preferred pharmacy. Nothing further needed.

## 2022-10-18 NOTE — Telephone Encounter (Signed)
Primary Pulmonologist: Dr. Halford Chessman  Last office visit and with whom: 05/10/2022 Dr. Halford Chessman  What do we see them for (pulmonary problems): COPD  Last OV assessment/plan: see below   Was appointment offered to patient (explain)?  Patient has an appt with Dr. Halford Chessman next week    Reason for call:  Patient states she is having a hard time catching her breath, Has been coughing up brown and yellow sputum. Has been wheezing.  Has been occurring for a few days. States she is more SOB. Was taking antibiotic Levaquin for 7 days and finished a couple of days ago but states it has not helped her.  Patient feels she can wait to see Dr. Halford Chessman next Wednesday to address other concerns if she has prednisone sent in for her.  Would like know if we will send in prednisone for her.   Dr. Halford Chessman is out of office so sending to Rexene Edison NP please advise.   Allergies  Allergen Reactions   Cefaclor Hives, Diarrhea and Rash   Sertraline     Suicidal thoughts, insomnia, nightmares   Statins Dermatitis and Rash    Multiple ecchymosis areas on all extremities.   Amoxicillin-Pot Clavulanate Diarrhea    Has patient had a PCN reaction causing immediate rash, facial/tongue/throat swelling, SOB or lightheadedness with hypotension: No Has patient had a PCN reaction causing severe rash involving mucus membranes or skin necrosis: No Has patient had a PCN reaction that required hospitalization: No Has patient had a PCN reaction occurring within the last 10 years: Yes If all of the above answers are "NO", then may proceed with Cephalosporin use.    Buspar [Buspirone] Nausea And Vomiting   Codeine Nausea And Vomiting    If pt takes a lot of this med   Latex Itching and Swelling    redness   Levaquin [Levofloxacin] Swelling    Can take up to 500 mg a day   Nsaids     Bleeding ulcers   Ciprofloxacin Nausea Only and Rash    Room was spinning    Immunization History  Administered Date(s) Administered   Influenza,inj,Quad  PF,6+ Mos 11/10/2021   Moderna Covid-19 Vaccine Bivalent Booster 75yr & up 11/10/2021   Moderna Sars-Covid-2 Vaccination 02/26/2020, 03/26/2020, 03/10/2021    ssessment/Plan:    COPD with emphysema and chronic bronchitis. - breztri and dry powder inhalers caused throat irritation and thrush - continue spiriva and dulera - prn albuterol - completed handicap parking form   Chronic respiratory failure with hypoxia. - continue 2 to 3 liters oxygen with exertion and sleep - goal SpO2 > 90%   Lt upper lung nodule. - stable on serial CT imaging, and can be considered benign - she doesn't need follow up imaging for this   Tobacco abuse. - reviewed options to help with smoking cessation   Tachycardia. - [facility]   Time Spent Involved in Patient Care on Day of Examination:  38 minutes   Follow up:    Patient Instructions  Follow up in 6 months   Medication List:    Allergies as of 05/10/2022         Reactions    Cefaclor Hives, Diarrhea, Rash    Sertraline      Suicidal thoughts, insomnia, nightmares    Statins Dermatitis, Rash    Multiple ecchymosis areas on all extremities.    Amoxicillin-pot Clavulanate Diarrhea    Has patient had a PCN reaction causing immediate  rash, facial/tongue/throat swelling, SOB or lightheadedness with hypotension: No Has patient had a PCN reaction causing severe rash involving mucus membranes or skin necrosis: No Has patient had a PCN reaction that required hospitalization: No Has patient had a PCN reaction occurring within the last 10 years: Yes If all of the above answers are "NO", then may proceed with Cephalosporin use.    Buspar [buspirone] Nausea And Vomiting    Codeine Nausea And Vomiting    If pt takes a lot of this med    Latex Itching, Swelling    redness    Levaquin [levofloxacin] Swelling    Can take up to 500 mg a day    Nsaids      Bleeding ulcers    Ciprofloxacin Nausea Only, Rash     Room was spinning            Medication List           Accurate as of May 10, 2022  1:41 PM. If you have any questions, ask your nurse or doctor.              acetaminophen 500 MG tablet Commonly known as: TYLENOL Take 500 mg by mouth every 6 (six) hours as needed for moderate pain.    atorvastatin 40 MG tablet Commonly known as: LIPITOR Take 40 mg by mouth daily.    citalopram 40 MG tablet Commonly known as: CELEXA Take 40 mg by mouth daily.    diazepam 5 MG tablet Commonly known as: VALIUM Take 5 mg by mouth every 6 (six) hours as needed for anxiety.    Dulera 200-5 MCG/ACT Aero Generic drug: mometasone-formoterol Inhale 2 puffs into the lungs 2 (two) times daily.    fexofenadine 60 MG tablet Commonly known as: ALLEGRA Take 60 mg by mouth daily.    fluticasone 50 MCG/ACT nasal spray Commonly known as: FLONASE Place 1 spray into both nostrils 2 (two) times daily.    loratadine 10 MG tablet Commonly known as: CLARITIN Take 10 mg by mouth daily as needed for allergies.    metFORMIN 500 MG 24 hr tablet Commonly known as: GLUCOPHAGE-XR Take 500 mg by mouth 2 (two) times daily.    metoprolol tartrate 50 MG tablet Commonly known as: LOPRESSOR Take 50 mg by mouth 2 (two) times daily.    naloxone 4 MG/0.1ML Liqd nasal spray kit Commonly known as: NARCAN Use 1 spray into one nostril. May use another spray into the other nostril every 2 to 3 minutes until the patient responds or until EMS arrives.    omeprazole 40 MG capsule Commonly known as: PRILOSEC Take 40 mg by mouth daily.    oxyCODONE-acetaminophen 5-325 MG tablet Commonly known as: PERCOCET/ROXICET Take 1 tablet by mouth every 6 (six) hours as needed for severe pain.    pregabalin 100 MG capsule Commonly known as: LYRICA Take 100 mg by mouth 3 (three) times daily.    ProAir HFA 108 (90 Base) MCG/ACT inhaler Generic drug: albuterol Inhale 2 puffs into the lungs every 4 (four) hours as needed  for wheezing or shortness of breath.    Spiriva Respimat 2.5 MCG/ACT Aers Generic drug: Tiotropium Bromide Monohydrate Inhale 2 puffs into the lungs daily.    vitamin B-12 1000 MCG tablet Commonly known as: CYANOCOBALAMIN Take 1 tablet (1,000 mcg total) by mouth daily.             Signature:  Chesley Mires, MD [place] Pager - [phone]  05/10/2022, 1:41 PM

## 2022-10-18 NOTE — Telephone Encounter (Signed)
That is fine Prednisone 20mg  daily for 5 days , take with food If not improving will need to come in for office visit sooner  Please contact office for sooner follow up if symptoms do not improve or worsen or seek emergency care

## 2022-10-27 ENCOUNTER — Other Ambulatory Visit: Payer: Self-pay

## 2022-10-27 ENCOUNTER — Encounter: Payer: Self-pay | Admitting: Pulmonary Disease

## 2022-10-27 ENCOUNTER — Ambulatory Visit (INDEPENDENT_AMBULATORY_CARE_PROVIDER_SITE_OTHER): Payer: Medicaid Other | Admitting: Pulmonary Disease

## 2022-10-27 VITALS — BP 128/84 | HR 116 | Temp 97.6°F | Ht 67.0 in | Wt 166.2 lb

## 2022-10-27 DIAGNOSIS — J449 Chronic obstructive pulmonary disease, unspecified: Secondary | ICD-10-CM

## 2022-10-27 DIAGNOSIS — Z720 Tobacco use: Secondary | ICD-10-CM

## 2022-10-27 DIAGNOSIS — J9611 Chronic respiratory failure with hypoxia: Secondary | ICD-10-CM

## 2022-10-27 MED ORDER — DULERA 200-5 MCG/ACT IN AERO: 2.0000 | INHALATION_SPRAY | Freq: Two times a day (BID) | RESPIRATORY_TRACT | 3 refills | Status: AC

## 2022-10-27 MED ORDER — ROFLUMILAST 500 MCG PO TABS: 500.0000 ug | ORAL_TABLET | Freq: Every day | ORAL | 11 refills | Status: DC

## 2022-10-27 MED ORDER — ROFLUMILAST 500 MCG PO TABS: 250.0000 ug | ORAL_TABLET | Freq: Every day | ORAL | 0 refills | Status: DC

## 2022-10-27 MED ORDER — SPIRIVA RESPIMAT 2.5 MCG/ACT IN AERS
2.0000 | INHALATION_SPRAY | Freq: Every day | RESPIRATORY_TRACT | 3 refills | Status: AC
Start: 2022-10-27 — End: ?

## 2022-10-27 NOTE — Patient Instructions (Addendum)
Daliresp 250 mcg daily for 4 weeks.  Then increase daliresp to 500 mcg daily.  Follow up in 8 weeks.

## 2022-10-27 NOTE — Progress Notes (Signed)
[name]  Chief Complaint  Patient presents with   Follow-up    Has questions about prednisone  Wants ov notes from today faxed to Dr. Bartolo Darter     Past Surgical History:  She  has a past surgical history that includes Tonsillectomy; Upper gastrointestinal endoscopy (1994); Anterior cervical decomp/discectomy fusion (N/A, 05/18/2017); Esophagogastroduodenoscopy (N/A, 07/14/2017); Posterior lumbar fusion (06/07/2018); Anterior lumbar fusion (2013); and Vaginal hysterectomy (1997).  Past Medical History:  Anxiety, HH, PUD, GERD, Depression, COPD, Low back pain, Arthritis   Constitutional:  BP 128/84   Pulse (!) 116   Temp 97.6 F (36.4 C) (Oral)   Ht _0  (1.702 m)   Wt 166 lb 3.2 oz (75.4 kg)   SpO2 94% Comment: ra  BMI 26.03 kg/m   Brief Summary:  Debra [name] is a 56 y.o. female smoker with COPD, lung nodule and chronic respiratory failure.      Subjective:   She continues to have trouble with her breathing.  She has been on prednisone several times in the past couple of months, and is taking prednisone now.  This helps, but then her symptoms flare up a few days after stopping prednisone.  She is down to 9 cigarettes per day and hoping to cut down by 1 cigarette per week.  She is worried she will end up with the same health issues as her parents, and she doesn't want to become dependent on prednisone.    She was told her oxygen level was okay during the day and her POC was taken away.  She still has oxygen at night.  She is worried about her breathing and as such she doesn't go out anywhere.  She gets tired easily and it is hard for her to do activities around her home.  Physical Exam:   Appearance - well kempt   ENMT - no sinus tenderness, no oral exudate, no LAN, Mallampati 3 airway, no stridor  Respiratory - decreased breath sounds bilaterally, no wheezing or rales  CV - s1s2 regular rate and rhythm, no murmurs  Ext -  no clubbing, no edema  Skin - no rashes  Psych - normal mood and affect       Pulmonary testing:  PFT 08/30/18 >> FEV1 1.47 (49%), FEV1% 49, TLC 7.08 (132%), DLCO 42% A1AT 08/30/18 >> 155, MM  Chest Imaging:  CT chest 02/07/20 >> 4 mm nodule LUL, mild paraseptal and centrilobular emphysema CT angio chest 11/23/20 >> RLL consolidation with patchy GGO b/l CT chest 03/03/22 >> atherosclerosis, mild paraseptal and centrilobular emphysema, bronchial wall thickening, LUL nodule stable  Cardiac Tests:  Echo 07/01/21 >> EF 55 to 60%  Social History:  She  reports that she has been smoking cigarettes. She has a 17.50 pack-year smoking history. She has never used smokeless tobacco. She reports current alcohol use. She reports that she does not currently use drugs.  Family History:  Her family history includes COPD in her mother; Heart attack in her paternal grandfather and paternal uncle; Heart disease in her father; Hypertension in her mother; Ulcers in her father.     Assessment/Plan:   COPD with emphysema and chronic bronchitis. - breztri and dry powder inhalers caused throat irritation and thrush - continue spiriva, dulera - she is to complete her current script for prednisone - will start her on daliresp 250 mcg daily for 1 month, and if she tolerates this then increased to 500 mcg daily - defer maintenance prednisone  for now - prn albuterol  Chronic respiratory failure with hypoxia. - 2 liters oxygen at night   Tobacco abuse. - encouraged her to keep up with her smoking cessation efforts  Tachycardia. - followed [facility] Dr[facility] Jacques Earthly with [facility]  Time Spent Involved in Patient Care on Day of Examination:  36 minutes  Follow up:   Patient Instructions  Daliresp 250 mcg daily for 4 weeks.  Then increase daliresp to 500 mcg daily.  Follow up in 8 weeks.  Medication List:   Allergies as of 10/27/2022       Reactions   Cefaclor Hives, Diarrhea, Rash    Sertraline    Suicidal thoughts, insomnia, nightmares   Statins Dermatitis, Rash   Multiple ecchymosis areas on all extremities.   Amoxicillin-pot Clavulanate Diarrhea   Has patient had a PCN reaction causing immediate rash, facial/tongue/throat swelling, SOB or lightheadedness with hypotension: No Has patient had a PCN reaction causing severe rash involving mucus membranes or skin necrosis: No Has patient had a PCN reaction that required hospitalization: No Has patient had a PCN reaction occurring within the last 10 years: Yes If all of the above answers are "NO", then may proceed with Cephalosporin use.   Buspar [buspirone] Nausea And Vomiting   Codeine Nausea And Vomiting   If pt takes a lot of this med   Latex Itching, Swelling   redness   Levaquin [levofloxacin] Swelling   Can take up to 500 mg a day   Nsaids    Bleeding ulcers   Ciprofloxacin Nausea Only, Rash   Room was spinning        Medication List        Accurate as of October 27, 2022 12:21 PM. If you have any questions, ask your nurse or doctor.          acetaminophen 500 MG tablet Commonly known as: TYLENOL Take 500 mg by mouth every 6 (six) hours as needed for moderate pain.   atorvastatin 40 MG tablet Commonly known as: LIPITOR Take 40 mg by mouth daily.   citalopram 40 MG tablet Commonly known as: CELEXA Take 40 mg by mouth daily.   cyanocobalamin 1000 MCG tablet Commonly known as: VITAMIN B12 Take 1 tablet (1,000 mcg total) by mouth daily.   diazepam 5 MG tablet Commonly known as: VALIUM Take 5 mg by mouth every 6 (six) hours as needed for anxiety.   Dulera 200-5 MCG/ACT Aero Generic drug: mometasone-formoterol Inhale 2 puffs into the lungs 2 (two) times daily.   fexofenadine 60 MG tablet Commonly known as: ALLEGRA Take 60 mg by mouth daily.   fluticasone 50 MCG/ACT nasal spray Commonly known as: FLONASE Place 1 spray into both nostrils 2 (two) times daily.   loratadine 10 MG  tablet Commonly known as: CLARITIN Take 10 mg by mouth daily as needed for allergies.   metFORMIN 500 MG 24 hr tablet Commonly known as: GLUCOPHAGE-XR Take 500 mg by mouth 2 (two) times daily.   metoprolol tartrate 50 MG tablet Commonly known as: LOPRESSOR Take 50 mg by mouth 2 (two) times daily.   naloxone 4 MG/0.1ML Liqd nasal spray kit Commonly known as: NARCAN Use 1 spray into one nostril. May use another spray into the other nostril every 2 to 3 minutes until the patient responds or until EMS arrives.   omeprazole 40 MG capsule Commonly known as: PRILOSEC Take 40 mg by mouth daily.   oxyCODONE-acetaminophen 5-325 MG tablet Commonly known as: PERCOCET/ROXICET Take 1  tablet by mouth every 6 (six) hours as needed for severe pain.   predniSONE 20 MG tablet Commonly known as: DELTASONE Take 1 tablet (20 mg total) by mouth daily with breakfast.   pregabalin 100 MG capsule Commonly known as: LYRICA Take 100 mg by mouth 3 (three) times daily.   ProAir HFA 108 (90 Base) MCG/ACT inhaler Generic drug: albuterol Inhale 2 puffs into the lungs every 4 (four) hours as needed for wheezing or shortness of breath.   roflumilast 500 MCG Tabs tablet Commonly known as: DALIRESP Take 0.5 tablets (250 mcg total) by mouth daily. Started by: Chesley Mires, MD   roflumilast 500 MCG Tabs tablet Commonly known as: DALIRESP Take 1 tablet (500 mcg total) by mouth daily. Started by: Chesley Mires, MD   Spiriva Respimat 2.5 MCG/ACT Aers Generic drug: Tiotropium Bromide Monohydrate Inhale 2 puffs into the lungs daily.        Signature:  Chesley Mires, MD [place] Pager - [phone] 10/27/2022, 12:21 PM

## 2022-10-28 ENCOUNTER — Other Ambulatory Visit (HOSPITAL_COMMUNITY): Payer: Self-pay

## 2022-10-29 ENCOUNTER — Other Ambulatory Visit (HOSPITAL_COMMUNITY): Payer: Self-pay

## 2022-11-01 ENCOUNTER — Encounter (INDEPENDENT_AMBULATORY_CARE_PROVIDER_SITE_OTHER): Payer: Self-pay | Admitting: *Deleted

## 2022-11-08 ENCOUNTER — Telehealth: Payer: Self-pay | Admitting: Pulmonary Disease

## 2022-11-10 MED ORDER — ROFLUMILAST 500 MCG PO TABS: 500.0000 ug | ORAL_TABLET | Freq: Every day | ORAL | 11 refills | Status: DC

## 2022-11-10 NOTE — Telephone Encounter (Signed)
Spoke with the pt  She states that the daliresp is too small for her to cut in half  She has been taking the whole 500 mcg tablet  She wants to know if this is okay to continue to do  She had some diarrhea that occurred a day ago, she believed had a stomach bug  Please advise, thanks

## 2022-11-10 NOTE — Telephone Encounter (Signed)
Okay to take 500 mcg daliresp daily.

## 2022-11-10 NOTE — Telephone Encounter (Signed)
Called and spoke with patient. She verbalized understanding. Refills of Dilaresp has been sent to patients pharmacy. Nothing further needed.

## 2022-11-25 ENCOUNTER — Encounter (INDEPENDENT_AMBULATORY_CARE_PROVIDER_SITE_OTHER): Payer: Self-pay | Admitting: *Deleted

## 2023-01-17 ENCOUNTER — Telehealth: Payer: Self-pay | Admitting: *Deleted

## 2023-01-17 NOTE — Telephone Encounter (Signed)
Primary Pulmonologist: Dr.Sood  Last office visit and with whom: 10/27/2022 What do we see them for (pulmonary problems): COPD mixed type/ Chronic resp failure  Last OV assessment/plan: see below   Was appointment offered to patient (explain)?  None available    Reason for call:  Patient states her lungs are hurting when she takes a breath in for about 3 days.  She is worried about having a lung infection and is coughing up dark yellow sputum.  Denies fever. Denies increased SOB  and wheezing.  She is going to PCP Thursday 01/20/23 but would like to know if she needs to have this taken care of before then or if she should bring it up to her PCP. Sending to Dr. Halford Chessman but patient is aware he is out of office today and may not respond until tomorrow and is ok with this.  Please advise, thanks!  Allergies  Allergen Reactions   Cefaclor Hives, Diarrhea and Rash   Sertraline     Suicidal thoughts, insomnia, nightmares   Statins Dermatitis and Rash    Multiple ecchymosis areas on all extremities.   Amoxicillin-Pot Clavulanate Diarrhea    Has patient had a PCN reaction causing immediate rash, facial/tongue/throat swelling, SOB or lightheadedness with hypotension: No Has patient had a PCN reaction causing severe rash involving mucus membranes or skin necrosis: No Has patient had a PCN reaction that required hospitalization: No Has patient had a PCN reaction occurring within the last 10 years: Yes If all of the above answers are "NO", then may proceed with Cephalosporin use.    Buspar [Buspirone] Nausea And Vomiting   Codeine Nausea And Vomiting    If pt takes a lot of this med   Latex Itching and Swelling    redness   Levaquin [Levofloxacin] Swelling    Can take up to 500 mg a day   Nsaids     Bleeding ulcers   Ciprofloxacin Nausea Only and Rash    Room was spinning    Immunization History  Administered Date(s) Administered   Influenza,inj,Quad PF,6+ Mos 11/10/2021   Moderna  Covid-19 Vaccine Bivalent Booster 63yr & up 11/10/2021   Moderna Sars-Covid-2 Vaccination 02/26/2020, 03/26/2020, 03/10/2021     Assessment/Plan:    COPD with emphysema and chronic bronchitis. - breztri and dry powder inhalers caused throat irritation and thrush - continue spiriva, dulera - she is to complete her current script for prednisone - will start her on daliresp 250 mcg daily for 1 month, and if she tolerates this then increased to 500 mcg daily - defer maintenance prednisone for now - prn albuterol   Chronic respiratory failure with hypoxia. - 2 liters oxygen at night   Tobacco abuse. - encouraged her to keep up with her smoking cessation efforts   Tachycardia. [facility]

## 2023-01-18 NOTE — Telephone Encounter (Signed)
ATC patient and LMTCB to schedule an appt. Nothing further needed

## 2023-01-18 NOTE — Telephone Encounter (Signed)
Please schedule her for an ROV with me in [place] sometime this week.  Okay to add her to an afternoon spot.

## 2023-01-19 ENCOUNTER — Telehealth: Payer: Self-pay | Admitting: Pulmonary Disease

## 2023-01-19 NOTE — Telephone Encounter (Signed)
Called and spoke to patient and she states that she is unable to come to [place] office this week. She states her mother had a stroke and she is having to take care of her full time right now. She also states that her jeep is not working right so she can't drive from [place] to [place] this week. Made Dr. Halford Chessman verbally aware. Nothing further needed at this time.

## 2023-01-19 NOTE — Telephone Encounter (Signed)
Patient called back returning a call from [place], attempted to schedule patient- patient denied appt due to mother not doing well and car not working properly.  Patient states she has an appointment with her medical doctor tomorrow and will request a COVID test, flu test, and RSV. Patient requesting a call back from [place].

## 2023-03-16 ENCOUNTER — Telehealth: Payer: Self-pay | Admitting: Pulmonary Disease

## 2023-03-16 ENCOUNTER — Other Ambulatory Visit: Payer: Self-pay

## 2023-03-16 DIAGNOSIS — R062 Wheezing: Secondary | ICD-10-CM

## 2023-03-16 NOTE — Telephone Encounter (Signed)
Called and spoke w/ pt she verbalized understanding and is going to go to [facility] tomorrow morning for a CXR.

## 2023-03-16 NOTE — Telephone Encounter (Signed)
She has been on antibx but her lungs hurt very badly. Set appt w/Dr. Craige Cotta for this Fri but pls call pt to advise if we can work her in. [place].  [phone]  TY

## 2023-03-16 NOTE — Telephone Encounter (Signed)
Please have her come in to do a chest xray today or tomorrow, and will review results with her at appointment on Friday.

## 2023-03-16 NOTE — Telephone Encounter (Signed)
Called and spoke w/ pt she verbalized that she presented to [facility] ER on 4/9 because after multiple rounds on steroids and antibiotics after being dx w/ bronchitis on approx 2/22 (pt is a bad historian w/ dates). She is not feeling better and her lungs have began hurting and has been running a low grade fever.   Reports that she has not had an CXR since 2/22 and has been given multiple rounds of antibiotics and steroids.  Dr.Sood she is scheduled to see you Friday @ 3pm. She refuses appt in [place] to see NP - please advise any further recommendations.

## 2023-03-16 NOTE — Telephone Encounter (Signed)
PT calling asking for [place]. States she has had bronchitis for 6 weeks.Presented to an out of state ER. Since then still not well.

## 2023-03-17 ENCOUNTER — Telehealth: Payer: Self-pay | Admitting: Pulmonary Disease

## 2023-03-17 NOTE — Telephone Encounter (Signed)
Attempted to call pt but unable to reach. Left message to return call.  

## 2023-03-17 NOTE — Telephone Encounter (Signed)
See last signed encounter. PT would like a CB from [place]. TY.

## 2023-03-18 ENCOUNTER — Other Ambulatory Visit: Payer: Self-pay

## 2023-03-18 ENCOUNTER — Ambulatory Visit (INDEPENDENT_AMBULATORY_CARE_PROVIDER_SITE_OTHER): Payer: Medicaid Other | Admitting: Pulmonary Disease

## 2023-03-18 ENCOUNTER — Ambulatory Visit (HOSPITAL_COMMUNITY)
Admission: RE | Admit: 2023-03-18 | Discharge: 2023-03-18 | Disposition: A | Discharge status: Home / Self Care | Payer: Medicaid Other | Source: Ambulatory Visit | Attending: Pulmonary Disease | Admitting: Pulmonary Disease

## 2023-03-18 ENCOUNTER — Encounter: Payer: Self-pay | Admitting: Pulmonary Disease

## 2023-03-18 VITALS — BP 102/63 | HR 98 | Ht 66.0 in | Wt 158.0 lb

## 2023-03-18 DIAGNOSIS — R0609 Other forms of dyspnea: Secondary | ICD-10-CM | POA: Diagnosis not present

## 2023-03-18 DIAGNOSIS — R062 Wheezing: Secondary | ICD-10-CM | POA: Diagnosis present

## 2023-03-18 DIAGNOSIS — J432 Centrilobular emphysema: Secondary | ICD-10-CM | POA: Diagnosis not present

## 2023-03-18 NOTE — Progress Notes (Signed)
[facility]  Chief Complaint  Patient presents with   Follow-up    Lungs burning    Past Surgical History:  She  has a past surgical history that includes Tonsillectomy; Upper gastrointestinal endoscopy (1994); Anterior cervical decomp/discectomy fusion (N/A, 05/18/2017); Esophagogastroduodenoscopy (N/A, 07/14/2017); Posterior lumbar fusion (06/07/2018); Anterior lumbar fusion (2013); and Vaginal hysterectomy (1997).  Past Medical History:  Anxiety, HH, PUD, GERD, Depression, COPD, Low back pain, Arthritis   Constitutional:  BP 102/63   Pulse 98   Ht 5\' 6"  (1.676 m)   Wt 158 lb (71.7 kg)   SpO2 91%   BMI 25.50 kg/m   Brief Summary:  Debra [name] is a 57 y.o. female smoker with COPD, lung nodule and chronic respiratory failure.      Subjective:   She has felt sick since February.  She has been on multiple rounds of prednisone and antibiotics.  She still has a few days left of most recent course of levaquin.  She feels her lungs are burning.  She has been more stressed dealing with her parents.  As a result she started smoking a pack per day again.  She doesn't think she has mold exposure.  She gets dizzy if she stands up too quick.  She hasn't had fever.  She saw her ENT recently, and was told exam was normal.  CXR from 03/18/23 was normal.  Physical Exam:   Appearance - well kempt   ENMT - no sinus tenderness, no oral exudate, no LAN, Mallampati 3 airway, no stridor, TM clear bilaterally  Respiratory - equal breath sounds bilaterally, no wheezing or rales  CV - s1s2 regular rate and rhythm, no murmurs  Ext - no clubbing, no edema  Skin - no rashes  Psych - normal mood and affect        Pulmonary testing:  PFT 08/30/18 >> FEV1 1.47 (49%), FEV1% 49, TLC 7.08 (132%), DLCO 42% A1AT 08/30/18 >> 155, MM  Chest Imaging:  CT chest 02/07/20 >> 4 mm nodule LUL, mild paraseptal and centrilobular emphysema CT angio chest  11/23/20 >> RLL consolidation with patchy GGO b/l CT chest 03/03/22 >> atherosclerosis, mild paraseptal and centrilobular emphysema, bronchial wall thickening, LUL nodule stable  Cardiac Tests:  Echo 01/17/23 >> EF greater than 55%  Social History:  She  reports that she has been smoking cigarettes. She has a 17.50 pack-year smoking history. She has never used smokeless tobacco. She reports current alcohol use. She reports that she does not currently use drugs.  Family History:  Her family history includes COPD in her mother; Heart attack in her paternal grandfather and paternal uncle; Heart disease in her father; Hypertension in her mother; Ulcers in her father.     Assessment/Plan:   Persistent dyspnea with "lungs burning". - she has been on multiple courses of antibiotics and prednisone without improvement - will arrange for CT chest to further assess  COPD with emphysema and chronic bronchitis. - breztri and dry powder inhalers caused throat irritation and thrush - continue spiriva, dulera - continue daliresp at lower dose for now, and consider increasing at next visit - prn albuterol  Chronic respiratory failure with hypoxia. - 2 liters oxygen at night   Tobacco abuse. - discussed how increased cigarette smoking could be contributing to her current symptoms and reviewed options to help quit  Tachycardia. 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 Willene Hatchet with [facility]  Time Spent Involved in Patient  Care on Day of Examination:  56 minutes  Follow up:   Patient Instructions  Will schedule CT chest and call with results  Follow up in 3 to 4 weeks  Medication List:   Allergies as of 03/18/2023       Reactions   Cefaclor Hives, Diarrhea, Rash   Sertraline    Suicidal thoughts, insomnia, nightmares   Statins Dermatitis, Rash   Multiple ecchymosis areas on all extremities.   Amoxicillin-pot Clavulanate Diarrhea   Has patient had a PCN reaction  causing immediate rash, facial/tongue/throat swelling, SOB or lightheadedness with hypotension: No Has patient had a PCN reaction causing severe rash involving mucus membranes or skin necrosis: No Has patient had a PCN reaction that required hospitalization: No Has patient had a PCN reaction occurring within the last 10 years: Yes If all of the above answers are "NO", then may proceed with Cephalosporin use.   Buspar [buspirone] Nausea And Vomiting   Codeine Nausea And Vomiting   If pt takes a lot of this med   Latex Itching, Swelling   redness   Levaquin [levofloxacin] Swelling   Can take up to 500 mg a day   Nsaids    Bleeding ulcers   Ciprofloxacin Nausea Only, Rash   Room was spinning        Medication List        Accurate as of March 18, 2023  3:49 PM. If you have any questions, ask your nurse or doctor.          acetaminophen 500 MG tablet Commonly known as: TYLENOL Take 500 mg by mouth every 6 (six) hours as needed for moderate pain.   atorvastatin 40 MG tablet Commonly known as: LIPITOR Take 40 mg by mouth daily.   citalopram 40 MG tablet Commonly known as: CELEXA Take 40 mg by mouth daily.   cyanocobalamin 1000 MCG tablet Commonly known as: VITAMIN B12 Take 1 tablet (1,000 mcg total) by mouth daily.   diazepam 5 MG tablet Commonly known as: VALIUM Take 5 mg by mouth every 6 (six) hours as needed for anxiety.   Dulera 200-5 MCG/ACT Aero Generic drug: mometasone-formoterol Inhale 2 puffs into the lungs 2 (two) times daily.   fexofenadine 60 MG tablet Commonly known as: ALLEGRA Take 60 mg by mouth daily.   fluticasone 50 MCG/ACT nasal spray Commonly known as: FLONASE Place 1 spray into both nostrils 2 (two) times daily.   loratadine 10 MG tablet Commonly known as: CLARITIN Take 10 mg by mouth daily as needed for allergies.   metFORMIN 500 MG 24 hr tablet Commonly known as: GLUCOPHAGE-XR Take 500 mg by mouth 2 (two) times daily.   metoprolol  tartrate 50 MG tablet Commonly known as: LOPRESSOR Take 50 mg by mouth 2 (two) times daily.   naloxone 4 MG/0.1ML Liqd nasal spray kit Commonly known as: NARCAN Use 1 spray into one nostril. May use another spray into the other nostril every 2 to 3 minutes until the patient responds or until EMS arrives.   omeprazole 40 MG capsule Commonly known as: PRILOSEC Take 40 mg by mouth daily.   oxyCODONE-acetaminophen 5-325 MG tablet Commonly known as: PERCOCET/ROXICET Take 1 tablet by mouth every 6 (six) hours as needed for severe pain.   predniSONE 20 MG tablet Commonly known as: DELTASONE Take 1 tablet (20 mg total) by mouth daily with breakfast.   pregabalin 100 MG capsule Commonly known as: LYRICA Take 100 mg by mouth 3 (three) times daily.  ProAir HFA 108 (90 Base) MCG/ACT inhaler Generic drug: albuterol Inhale 2 puffs into the lungs every 4 (four) hours as needed for wheezing or shortness of breath.   roflumilast 500 MCG Tabs tablet Commonly known as: DALIRESP Take 1 tablet (500 mcg total) by mouth daily.   Spiriva Respimat 2.5 MCG/ACT Aers Generic drug: Tiotropium Bromide Monohydrate Inhale 2 puffs into the lungs daily.        Signature:  Coralyn Helling, MD [facility]/Critical Care Pager - [phone] 03/18/2023, 3:49 PM

## 2023-03-18 NOTE — Patient Instructions (Signed)
Will schedule CT chest and call with results  Follow up in 3 to 4 weeks

## 2023-03-18 NOTE — Telephone Encounter (Signed)
Pt returned called she was unable to go get the x-ray Dr.Sood advised. She reports that she was afraid to drive to the hospital because she was feeling unwell.   She state that none of her family was willing to take her to the hospital and her children are out of time. She reports that she called [facility] this morning to get the xray but got upset w/ the technician on the phone and hung up.   I instructed pt that I am going to change the order to a stat x-ray and she verbalized that she going to go get the xray @ 12pm and then come to her appt @ 3pm.   NFN at the time of call.

## 2023-03-30 ENCOUNTER — Telehealth: Payer: Self-pay | Admitting: *Deleted

## 2023-03-30 NOTE — Telephone Encounter (Signed)
Patient would like to discuss increasing dosage of Dalieresp. Patient forgot to ask while in office last OV 03/18/23. Patient states that "her lungs have eased up" and would like to see if a CT scan is needed or not.  Patient would also like refill of medications Stilera inhaler and Spireva. Patient would like 90 day supplies if possible.   Patient uses [facility] in [place] and would like a call back when prescriptions are in. [phone]

## 2023-03-31 ENCOUNTER — Other Ambulatory Visit: Payer: Self-pay

## 2023-04-01 NOTE — Telephone Encounter (Signed)
Okay to change daliresp to 500 mcg bid.  Okay to cancel CT chest if her breathing is doing better.

## 2023-04-11 NOTE — Telephone Encounter (Signed)
ATC pt left detailed VM w/ Vs message (ok per DPR)

## 2023-04-12 NOTE — Telephone Encounter (Signed)
Patient called back and the msg. From the doctor was relayed to her.  She would still like the nurse to call her to discuss further.  CB# [phone]

## 2023-04-14 ENCOUNTER — Telehealth: Payer: Self-pay | Admitting: Pulmonary Disease

## 2023-04-14 MED ORDER — ROFLUMILAST 500 MCG PO TABS: 500.0000 ug | ORAL_TABLET | Freq: Two times a day (BID) | ORAL | 11 refills | Status: AC

## 2023-04-14 NOTE — Telephone Encounter (Signed)
I called and spoke with the pt  She states that her o2 was low at 88% on ra when she woke up this morning  She wanted to see how she would feel sleeping without o2 on last night  I told her that she needs to be using 2lpm with sleep as instructed at last visit  She verbalized understanding  I sent in new rx for Daliresp bid ok per last phone note  Pt to keep appt with Dr Craige Cotta for 04/29/23 and will call sooner if needed

## 2023-04-18 ENCOUNTER — Other Ambulatory Visit (HOSPITAL_COMMUNITY): Payer: Medicaid Other

## 2023-04-20 ENCOUNTER — Encounter (INDEPENDENT_AMBULATORY_CARE_PROVIDER_SITE_OTHER): Payer: Self-pay | Admitting: *Deleted

## 2023-04-29 ENCOUNTER — Ambulatory Visit: Payer: Medicaid Other | Admitting: Pulmonary Disease

## 2023-05-02 ENCOUNTER — Encounter (INDEPENDENT_AMBULATORY_CARE_PROVIDER_SITE_OTHER): Payer: Self-pay | Admitting: *Deleted

## 2023-05-04 ENCOUNTER — Telehealth: Payer: Self-pay | Admitting: *Deleted

## 2023-05-04 NOTE — Telephone Encounter (Signed)
Patient called and states she would like to discuss getting more time with a home health Aid. Patient would like to be seen in office to discuss oxygen. Scheduled for 05/19/23 with Dr. Craige Cotta   Please call and advise patient. [phone]

## 2023-05-05 NOTE — Telephone Encounter (Signed)
I called and spoke with the Debra [name]  She is scheduled with [facility] for 05/19/23 for o2 eval  Offered sooner appt and she refused  She is asking if Dr Craige Cotta will go ahead and order home health aid to help her more at home Dr Craige Cotta, please advise, thanks!

## 2023-05-06 NOTE — Telephone Encounter (Signed)
Okay to send order for home health aide assessment.

## 2023-05-06 NOTE — Telephone Encounter (Signed)
Called and spoke with patient. She stated that she already has a home health aide and she is requesting more time from the [employer] program. She is not sure if Dr. Craige Cotta was the one who got this in place for her or if it was one of her PCPs. She has had to switch PCPs several times due to finding out their are not apart of her insurance network. [place]   She receives her services from [facility]. Will call on Monday to see if she can receive extra time.

## 2023-05-10 NOTE — Telephone Encounter (Signed)
Pt called in because she no longer has her Home Health Aide. Her aide has officially quit as of yesterday and now pt doesn't have anyone in the home with her.

## 2023-05-11 NOTE — Telephone Encounter (Signed)
Spoke with patient. She states home health company is looking for her a new aide. She states she is doing okay for now. Home health company is supposed to call her back over the next few days.  States she will call them Friday-states if they dont have anyone for her by  then she will call our office back. Closing encounter now nfn

## 2023-05-19 ENCOUNTER — Telehealth (INDEPENDENT_AMBULATORY_CARE_PROVIDER_SITE_OTHER): Payer: Medicaid Other | Admitting: Pulmonary Disease

## 2023-05-19 ENCOUNTER — Encounter (HOSPITAL_BASED_OUTPATIENT_CLINIC_OR_DEPARTMENT_OTHER): Payer: Self-pay | Admitting: Pulmonary Disease

## 2023-05-19 DIAGNOSIS — J9611 Chronic respiratory failure with hypoxia: Secondary | ICD-10-CM | POA: Diagnosis not present

## 2023-05-19 DIAGNOSIS — J418 Mixed simple and mucopurulent chronic bronchitis: Secondary | ICD-10-CM | POA: Diagnosis not present

## 2023-05-19 DIAGNOSIS — Z716 Tobacco abuse counseling: Secondary | ICD-10-CM | POA: Diagnosis not present

## 2023-05-19 NOTE — Progress Notes (Signed)
[facility]  Chief Complaint  Patient presents with   Follow-up    4wk f/u for COPD. States she has been struggling with the heat and humid. [employer] in [place] has taken away her POC. Will do a qualifying walk at PCP next week. Wants an order for home health. Uses 3.5L of O2.     Past Surgical History:  She  has a past surgical history that includes Tonsillectomy; Upper gastrointestinal endoscopy (1994); Anterior cervical decomp/discectomy fusion (N/A, 05/18/2017); Esophagogastroduodenoscopy (N/A, 07/14/2017); Posterior lumbar fusion (06/07/2018); Anterior lumbar fusion (2013); and Vaginal hysterectomy (1997).  Past Medical History:  Anxiety, HH, PUD, GERD, Depression, COPD, Low back pain, Arthritis   Constitutional:  There were no vitals taken for this visit.  Brief Summary:  Debra [name] is a 57 y.o. female smoker with COPD, lung nodule and chronic respiratory failure.      Subjective:  Virtual Visit via Video Note  I connected with Debra [name] on 05/19/23 at  8:45 AM EDT by a video enabled telemedicine application and verified that I am speaking with the correct person using two identifiers.  Location: Patient: home Provider: medical office   I discussed the limitations of evaluation and management by telemedicine and the availability of in person appointments. The patient expressed understanding and agreed to proceed.  History of Present Illness: Her home health aide got another clinic and wasn't able to work with Marcelino Duster anymore.  She has trouble with household chores and shopping due to being short of breath.  She also still feels weak at time since she had COVID.  She is down to 1/2 ppd.  Still has some cough and sputum, but not as much as before.  Was started on mycelex troche by her PCP for thrush.  She had her POC removed by her DME.  She isn't sure why it was taken away, but it is more difficult for her to do activity  since she doesn't have her POC.  She still uses oxygen at night.   Physical Exam:  Alert, normal speech, no distress        Pulmonary testing:  PFT 08/30/18 >> FEV1 1.47 (49%), FEV1% 49, TLC 7.08 (132%), DLCO 42% A1AT 08/30/18 >> 155, MM  Chest Imaging:  CT chest 02/07/20 >> 4 mm nodule LUL, mild paraseptal and centrilobular emphysema CT angio chest 11/23/20 >> RLL consolidation with patchy GGO b/l CT chest 03/03/22 >> atherosclerosis, mild paraseptal and centrilobular emphysema, bronchial wall thickening, LUL nodule stable  Cardiac Tests:  Echo 01/17/23 >> EF greater than 55%  Social History:  She  reports that she has been smoking cigarettes. She has a 17.50 pack-year smoking history. She has never used smokeless tobacco. She reports current alcohol use. She reports that she does not currently use drugs.  Family History:  Her family history includes COPD in her mother; Heart attack in her paternal grandfather and paternal uncle; Heart disease in her father; Hypertension in her mother; Ulcers in her father.     Assessment/Plan:   COPD with emphysema and chronic bronchitis. - breztri and dry powder inhalers caused throat irritation and thrush - continue spiriva,  - continue spiriva, dulera - continue daliresp at lower dose due to GI side effects at hire dose - prn albuterol - she is significantly limited in her activity and would benefit from health health therapy >> order placed for this  Chronic respiratory failure with hypoxia. - will arrange for ambulatory oximetry to determine  if she can get a POC again - continue 2 liters oxygen at night   Tobacco abuse. - she is making progressive with smoking cessation - encouraged her to keep up with her efforts - spent 4 minutes discussing smoking cesation options  Tachycardia. 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 Willene Hatchet with [facility]  Time Spent Involved in Patient Care on Day of Examination:    I discussed the assessment and treatment plan with the patient. The patient was provided an opportunity to ask questions and all were answered. The patient agreed with the plan and demonstrated an understanding of the instructions.   The patient was advised to call back or seek an in-person evaluation if the symptoms worsen or if the condition fails to improve as anticipated.  I provided 35 minutes of non-face-to-face time during this encounter.  Follow up:   There are no Patient Instructions on file for this visit.  Medication List:   Allergies as of 05/19/2023       Reactions   Cefaclor Hives, Diarrhea, Rash   Sertraline    Suicidal thoughts, insomnia, nightmares   Statins Dermatitis, Rash   Multiple ecchymosis areas on all extremities.   Amoxicillin-pot Clavulanate Diarrhea   Has patient had a PCN reaction causing immediate rash, facial/tongue/throat swelling, SOB or lightheadedness with hypotension: No Has patient had a PCN reaction causing severe rash involving mucus membranes or skin necrosis: No Has patient had a PCN reaction that required hospitalization: No Has patient had a PCN reaction occurring within the last 10 years: Yes If all of the above answers are "NO", then may proceed with Cephalosporin use.   Buspar [buspirone] Nausea And Vomiting   Codeine Nausea And Vomiting   If pt takes a lot of this med   Latex Itching, Swelling   redness   Levaquin [levofloxacin] Swelling   Can take up to 500 mg a day   Nsaids    Bleeding ulcers   Ciprofloxacin Nausea Only, Rash   Room was spinning        Medication List        Accurate as of May 19, 2023  8:53 AM. If you have any questions, ask your nurse or doctor.          STOP taking these medications    predniSONE 20 MG tablet Commonly known as: DELTASONE       TAKE these medications    acetaminophen 500 MG tablet Commonly known as: TYLENOL Take 500 mg by mouth every 6 (six) hours as needed for  moderate pain.   atorvastatin 40 MG tablet Commonly known as: LIPITOR Take 40 mg by mouth daily.   citalopram 40 MG tablet Commonly known as: CELEXA Take 40 mg by mouth daily. In the process of tapering off medication 05/19/23   cyanocobalamin 1000 MCG tablet Commonly known as: VITAMIN B12 Take 1 tablet (1,000 mcg total) by mouth daily.   diazepam 5 MG tablet Commonly known as: VALIUM Take 5 mg by mouth every 6 (six) hours as needed for anxiety. In the process of tapering off medication 05/19/23.   Dulera 200-5 MCG/ACT Aero Generic drug: mometasone-formoterol Inhale 2 puffs into the lungs 2 (two) times daily.   fexofenadine 60 MG tablet Commonly known as: ALLEGRA Take 60 mg by mouth daily.   fluticasone 50 MCG/ACT nasal spray Commonly known as: FLONASE Place 1 spray into both nostrils 2 (two) times daily.   loratadine 10 MG tablet Commonly known as: CLARITIN  Take 10 mg by mouth daily as needed for allergies.   metFORMIN 500 MG 24 hr tablet Commonly known as: GLUCOPHAGE-XR Take 500 mg by mouth 2 (two) times daily.   metoprolol tartrate 50 MG tablet Commonly known as: LOPRESSOR Take 50 mg by mouth 2 (two) times daily.   naloxone 4 MG/0.1ML Liqd nasal spray kit Commonly known as: NARCAN Use 1 spray into one nostril. May use another spray into the other nostril every 2 to 3 minutes until the patient responds or until EMS arrives.   omeprazole 40 MG capsule Commonly known as: PRILOSEC Take 40 mg by mouth daily.   oxyCODONE-acetaminophen 5-325 MG tablet Commonly known as: PERCOCET/ROXICET Take 1 tablet by mouth every 6 (six) hours as needed for severe pain.   pregabalin 100 MG capsule Commonly known as: LYRICA Take 100 mg by mouth 3 (three) times daily.   ProAir HFA 108 (90 Base) MCG/ACT inhaler Generic drug: albuterol Inhale 2 puffs into the lungs every 4 (four) hours as needed for wheezing or shortness of breath.   roflumilast 500 MCG Tabs tablet Commonly  known as: DALIRESP Take 1 tablet (500 mcg total) by mouth in the morning and at bedtime.   Spiriva Respimat 2.5 MCG/ACT Aers Generic drug: Tiotropium Bromide Monohydrate Inhale 2 puffs into the lungs daily.        Signature:  Coralyn Helling, MD [facility] Pager - [phone] 05/19/2023, 8:53 AM

## 2023-05-19 NOTE — Patient Instructions (Signed)
Will arrange for home health assessment  Will arrange for ambulatory oximetry testing at the [facility] office  Follow up in 3 months

## 2023-05-24 ENCOUNTER — Telehealth: Payer: Self-pay | Admitting: Pulmonary Disease

## 2023-05-24 ENCOUNTER — Encounter (INDEPENDENT_AMBULATORY_CARE_PROVIDER_SITE_OTHER): Payer: Self-pay | Admitting: *Deleted

## 2023-05-24 NOTE — Telephone Encounter (Signed)
Pt calling bc she wants a 6 min walk test done but we do not have a order in for her to have one done, pls advise

## 2023-06-03 NOTE — Telephone Encounter (Signed)
Rerouting this encounter regarding a 6 minute walk test for insurance purposes. Please advise.   Please call patient back [phone]

## 2023-06-08 NOTE — Telephone Encounter (Signed)
It looks like she needs ov with provider to qualify for o2  So, it's a qualifying walk NOT a six minute walk (this often gets confused)   Called the pt and there was no answer- LMTCB   When she calls back can be scheduled for appt with APP or VS and walk cn be done at the time of this visit

## 2023-06-09 NOTE — Telephone Encounter (Signed)
ATC patient. LVMTCB. 

## 2023-06-10 NOTE — Telephone Encounter (Signed)
Left message x 2 on VM for patient to call clinic.

## 2023-06-13 NOTE — Telephone Encounter (Signed)
Called and spoke with pt, she will call isley to get an appointment in [place]. Nfn

## 2023-06-16 ENCOUNTER — Telehealth: Payer: Self-pay | Admitting: Pulmonary Disease

## 2023-06-16 NOTE — Telephone Encounter (Signed)
Order was placed on 6/20 for Home Health.  I discovered the order on 7/9.  I have contacted 9 different home care companies to try to find someone to take patient and haven't had any luck.  They either don't have nursing available in the area at this time, don't service that area or have a CAP with that type of insurance that has already been met.  All companies and responses are listed in the referral.  Not sure how to proceed.

## 2023-06-17 NOTE — Telephone Encounter (Signed)
Please let her know that unfortunately there aren't any home health agencies in her area that can provide services.

## 2023-06-24 NOTE — Telephone Encounter (Signed)
Spoke with the pt and notified of PCC's response. She understands. Nothing further needed.

## 2023-06-30 DEATH — deceased

## 2023-07-14 ENCOUNTER — Telehealth: Payer: Self-pay

## 2023-07-14 NOTE — Telephone Encounter (Signed)
Patient called to report she has been sick since early July. She states she has been to [facility] and was admitted for dyspnea, chest pain, back pain; given IV morphine, steroids, multiple nebs, and Levaquin. She has also had multiple rounds of oral pred over the past month. Current O2 while on phone is 97% on room air. She would like to know what other medications she can take; states she has been using Dulera and Spiriva as directed, has also been using Alb HFA daily. Advised patient she needs an OV to assess, she declined multiple times stating she didn't want to drive that far. She only wants to know what you recommend.  Please advise, thank you!

## 2023-07-15 ENCOUNTER — Telehealth: Payer: Self-pay | Admitting: Acute Care

## 2023-07-15 NOTE — Telephone Encounter (Signed)
Routed to Maralyn Sago as DOD since [place] has not replied.

## 2023-07-15 NOTE — Telephone Encounter (Signed)
I was notified as App of the Day this patient wanted to speak with a nurse regarding being short of breath and not knowing which medications to take. I have called her number twice . The first time I was able to leave a partial message , but the VM then disconnected.  The second call never went to VM, but just continued to ring. There was no answer.  If she calls the after hours on call number, she should seek emergency care if she cannot breath.  She is a Dr. Craige Cotta patient.   Please call to check on patient Monday to ensure she is ok.  Message received Friday at 4:50 pm Patient is calling wanting to speak to a nurse. She is having lung issues and can't breath well.She needs to know which medications to take.Please call and advise.

## 2023-07-15 NOTE — Telephone Encounter (Signed)
Patient is calling wanting to speak to a nurse. She is having lung issues and can't breath well.She needs to know which medications to take.Please call and advise.

## 2023-07-18 NOTE — Telephone Encounter (Signed)
ATC X1 LVM for patient to call the office back 

## 2023-07-29 ENCOUNTER — Telehealth: Payer: Self-pay | Admitting: *Deleted

## 2023-07-29 NOTE — Telephone Encounter (Signed)
Spoke with patient, she is seeking advice on her current medications (most of which are not on her list). She states her PCP told her it would be up to [facility] provider to tell her if any of her meds are causing her to be shob and have a low heart rate. She also states Amy from her PCP told patient that she had called our office and was told someone would be in contact with the patient to have her worked in to an OV this week. Per chart there are no notes related to this, patient was advised of this. Patient is adamant that she will come in to the office on 9/30 with her dad and that Sood WILL listen to her lungs and talk to her. She was advised multiple times that she cannot walk in to the office and expect a visit with a provider; his schedule is currently full, however if he has a cancellation we can add her to the schedule. She continued to be interruptive and dismissive and states "we will see about that". She is upset that no one will tell her if her medications are causing her symptoms; I advised that would be information coming from a provider and not the clinical staff. She ultimately says she will just come on in 09/01/23 for her currently scheduled OV; I did have to let her know this would not be with Vassie Loll, will be with TP. She is not please about this. She said since there was nothing I could do for her she was going to end the call, she then disconnected.

## 2023-07-29 NOTE — Telephone Encounter (Signed)
Patient called again and states she had a recent reaction from various medications and called EMS due to difficulty breathing and SOB.  Patient states she is trying to taper down from Celexa, hydroxyzine and "something with a prazine" but her med list does not list all of these medications. Patient would like to discuss which medications on her med list are OK to take without any interactions.  Patient has upcoming OV on 09/01/23 with Tammy Parrett, but would like to see if she can be seen by Dr. Craige Cotta with her father Reece Agar, scheduled for 9/30). Dr. Craige Cotta does not have any openings available and would need OK by Dr. Craige Cotta to [facility]  Patient does not wish to be seen by any other provider and would like to follow Dr. Craige Cotta to the office he will be going to.  I personally spoke with the patient for approximately 10 minutes on the phone and informed her I am not clinical and can only help relay the message to our clinical team members. Patient verbalized understanding but would like to receive a call back ASAP today from our clinical team  Please call and advise patient. [phone]

## 2023-08-05 ENCOUNTER — Telehealth: Payer: Self-pay | Admitting: Internal Medicine

## 2023-08-05 ENCOUNTER — Telehealth: Payer: Self-pay | Admitting: Pulmonary Disease

## 2023-08-05 NOTE — Telephone Encounter (Signed)
Patient is calling because she would like to have an appointment with Dr.Sood. She states that she has a knot in her shoulder blades that is painful and isn't sure of what it is. She said she will get a massage to see if it goes away. She's gone to the hospital and has taken medication.She is scared that something is wrong. She currently has an appointment with Tammy NP on October 3rd. I offered her a new patient appointment with Dr. Sherene Sires on the 1st of October but she said she'll wait until the 3rd.

## 2023-08-05 NOTE — Telephone Encounter (Signed)
Error

## 2023-09-01 ENCOUNTER — Ambulatory Visit: Payer: Medicaid Other | Admitting: Adult Health

## 2023-09-05 ENCOUNTER — Ambulatory Visit (INDEPENDENT_AMBULATORY_CARE_PROVIDER_SITE_OTHER): Payer: Medicaid Other | Admitting: Gastroenterology

## 2023-09-07 ENCOUNTER — Ambulatory Visit (INDEPENDENT_AMBULATORY_CARE_PROVIDER_SITE_OTHER): Payer: Medicaid Other | Admitting: Gastroenterology

## 2023-09-08 ENCOUNTER — Ambulatory Visit (INDEPENDENT_AMBULATORY_CARE_PROVIDER_SITE_OTHER): Payer: Medicaid Other | Admitting: Gastroenterology

## 2023-09-26 ENCOUNTER — Ambulatory Visit (INDEPENDENT_AMBULATORY_CARE_PROVIDER_SITE_OTHER): Payer: Medicaid Other | Admitting: Gastroenterology

## 2023-10-03 ENCOUNTER — Encounter (INDEPENDENT_AMBULATORY_CARE_PROVIDER_SITE_OTHER): Payer: Self-pay | Admitting: *Deleted

## 2023-10-10 ENCOUNTER — Ambulatory Visit (INDEPENDENT_AMBULATORY_CARE_PROVIDER_SITE_OTHER): Payer: Medicaid Other | Admitting: Gastroenterology

## 2023-10-20 ENCOUNTER — Encounter: Payer: Medicaid Other | Admitting: Obstetrics & Gynecology

## 2023-11-11 ENCOUNTER — Encounter: Payer: Medicaid Other | Admitting: Obstetrics & Gynecology

## 2023-12-06 ENCOUNTER — Ambulatory Visit (INDEPENDENT_AMBULATORY_CARE_PROVIDER_SITE_OTHER): Payer: Medicaid Other | Admitting: Gastroenterology

## 2023-12-09 ENCOUNTER — Encounter: Payer: Medicaid Other | Admitting: Obstetrics & Gynecology

## 2023-12-13 ENCOUNTER — Other Ambulatory Visit (HOSPITAL_COMMUNITY): Payer: Self-pay | Admitting: Psychiatry

## 2023-12-13 DIAGNOSIS — Z1231 Encounter for screening mammogram for malignant neoplasm of breast: Secondary | ICD-10-CM

## 2023-12-14 ENCOUNTER — Telehealth: Payer: Self-pay | Admitting: Pulmonary Disease

## 2023-12-14 DIAGNOSIS — R0609 Other forms of dyspnea: Secondary | ICD-10-CM

## 2023-12-14 DIAGNOSIS — J449 Chronic obstructive pulmonary disease, unspecified: Secondary | ICD-10-CM

## 2023-12-14 NOTE — Telephone Encounter (Signed)
 Can we have this order:[id number] canceled from 03/18/2023.  It's a dr. Matilde Son order. Thank you!

## 2023-12-26 NOTE — Addendum Note (Signed)
Addended by: Janean Sark on: 12/26/2023 02:34 PM   Modules accepted: Orders

## 2023-12-28 ENCOUNTER — Ambulatory Visit (HOSPITAL_COMMUNITY): Payer: Medicaid Other

## 2024-01-03 ENCOUNTER — Ambulatory Visit (HOSPITAL_COMMUNITY): Payer: Medicaid Other

## 2024-01-16 ENCOUNTER — Encounter: Payer: Medicaid Other | Admitting: Obstetrics & Gynecology

## 2024-01-24 ENCOUNTER — Telehealth: Payer: Self-pay | Admitting: Obstetrics & Gynecology

## 2024-01-24 NOTE — Telephone Encounter (Signed)
 Patient called stating that she has been sick and will call back when she can bring her.

## 2024-02-07 ENCOUNTER — Encounter: Payer: Self-pay | Admitting: Internal Medicine

## 2024-02-07 ENCOUNTER — Ambulatory Visit: Payer: Medicaid Other | Attending: Internal Medicine | Admitting: Internal Medicine

## 2024-02-07 VITALS — BP 100/60 | HR 78 | Ht 66.0 in | Wt 149.8 lb

## 2024-02-07 DIAGNOSIS — Z136 Encounter for screening for cardiovascular disorders: Secondary | ICD-10-CM | POA: Insufficient documentation

## 2024-02-07 DIAGNOSIS — F39 Unspecified mood [affective] disorder: Secondary | ICD-10-CM | POA: Insufficient documentation

## 2024-02-07 DIAGNOSIS — R5381 Other malaise: Secondary | ICD-10-CM | POA: Insufficient documentation

## 2024-02-07 NOTE — Patient Instructions (Addendum)
 Medication Instructions:  Your physician recommends that you continue on your current medications as directed. Please refer to the Current Medication list given to you today.   Labwork: None  Testing/Procedures: None  Follow-Up: Your physician recommends that you schedule a follow-up appointment in: Follow as needed  Any Other Special Instructions Will Be Listed Below (If Applicable).  Referral to Psychiatry  Thank you for choosing [facility]!       If you need a refill on your cardiac medications before your next appointment, please call your pharmacy.

## 2024-02-07 NOTE — Progress Notes (Signed)
 Cardiology Office Note  Date: 02/07/2024   ID: Debra [name], DOB 18-Jan-1966, MRN [id number]  PCP:  [facility]  Cardiologist:  Marjo Bicker, MD Electrophysiologist:  None   History of Present Illness: Debra [name] is a 58 y.o. female known to have COPD, anxiety was referred to cardiology clinic for evaluation of POTS.  Orthostatic vitals today in the clinic were negative for orthostatic hypotension and POTS.  She was seen by [facility] cardiology for similar symptoms who recommended to continue metoprolol, physical therapy until table testing.  Patient did not want to travel to [facility] for physical therapy.  She wanted home physical therapy.  She is upset vital table testing was not done in the same office visit at [facility].  She reported having nonspecific symptoms like chest pain, SOB, dizziness, nausea, generalized body weakness etc.  When she tries to get up and try to walk.  She is worried that her heart rate is going up to 130s when she gets up and walks.  She is not able to find any physical therapist that can come to her house due to insurance/Medicaid issues.  She had COVID-19 infection around 3 years ago where she was admitted in the ICU for 1 month.  She lost her mother around August 2024 after which she was feeling sick.  She reported having yeast infections and requesting to prescribe medications.  Past Medical History:  Diagnosis Date   Anxiety    panic attack   Arthritis    "lower back" (06/07/2018)- osteo   Chronic lower back pain    Complication of anesthesia    anesthesia for neck surgery - had confusion/forgetfulness   COPD (chronic obstructive pulmonary disease) (HCC)    Depression    Dyspnea    with exertion   GERD (gastroesophageal reflux disease)    History of bleeding ulcers 1994   History of hiatal hernia    History of low potassium    Pneumonia 2013    Past Surgical History:  Procedure Laterality Date   ANTERIOR CERVICAL  DECOMP/DISCECTOMY FUSION N/A 05/18/2017   Procedure: ACDF - C6-C7;  Surgeon: Coletta Memos, MD;  Location: [facility] OR;  Service: Neurosurgery;  Laterality: N/A;   ANTERIOR LUMBAR FUSION  2013   "L-6"   ESOPHAGOGASTRODUODENOSCOPY N/A 07/14/2017   Procedure: ESOPHAGOGASTRODUODENOSCOPY (EGD);  Surgeon: Meryl Dare, MD;  Location: [facility] ENDOSCOPY;  Service: Endoscopy;  Laterality: N/A;   POSTERIOR LUMBAR FUSION  06/07/2018   L5-S1   TONSILLECTOMY     UPPER GASTROINTESTINAL ENDOSCOPY  1994   VAGINAL HYSTERECTOMY  1997    Current Outpatient Medications  Medication Sig Dispense Refill   acetaminophen (TYLENOL) 500 MG tablet Take 500 mg by mouth every 6 (six) hours as needed for moderate pain.     ALPRAZolam (XANAX) 0.5 MG tablet Take by mouth.     citalopram (CELEXA) 40 MG tablet Take 40 mg by mouth daily. In the process of tapering off medication 05/19/23     DULERA 200-5 MCG/ACT AERO Inhale 2 puffs into the lungs 2 (two) times daily. 39 g 3   fexofenadine (ALLEGRA) 60 MG tablet Take 60 mg by mouth daily.     fluticasone (FLONASE) 50 MCG/ACT nasal spray Place 1 spray into both nostrils 2 (two) times daily.     loratadine (CLARITIN) 10 MG tablet Take 10 mg by mouth daily as needed for allergies.     metoprolol tartrate (LOPRESSOR) 50 MG tablet Take 75 mg  by mouth 2 (two) times daily.     naloxone (NARCAN) nasal spray 4 mg/0.1 mL Use 1 spray into one nostril. May use another spray into the other nostril every 2 to 3 minutes until the patient responds or until EMS arrives. 2 each 3   omeprazole (PRILOSEC) 40 MG capsule Take 40 mg by mouth daily.     oxyCODONE-acetaminophen (PERCOCET/ROXICET) 5-325 MG tablet Take 1 tablet by mouth 3 (three) times daily.     pregabalin (LYRICA) 100 MG capsule Take 100 mg by mouth 3 (three) times daily.     PROAIR HFA 108 (90 Base) MCG/ACT inhaler Inhale 2 puffs into the lungs every 4 (four) hours as needed for wheezing or shortness of breath.      roflumilast (DALIRESP)  500 MCG TABS tablet Take 1 tablet (500 mcg total) by mouth in the morning and at bedtime. 60 tablet 11   Tiotropium Bromide Monohydrate (SPIRIVA RESPIMAT) 2.5 MCG/ACT AERS Inhale 2 puffs into the lungs daily. 12 g 3   vitamin B-12 (CYANOCOBALAMIN) 1000 MCG tablet Take 1 tablet (1,000 mcg total) by mouth daily. 90 tablet 0   atorvastatin (LIPITOR) 40 MG tablet Take 40 mg by mouth daily. (Patient not taking: Reported on 02/07/2024)     No current facility-administered medications for this visit.   Allergies:  Cefaclor, Sertraline, Statins, Amoxicillin-pot clavulanate, Buspar [buspirone], Codeine, Latex, Levaquin [levofloxacin], Nsaids, and Ciprofloxacin   Social History: The patient  reports that she has been smoking cigarettes. She has a 17.5 pack-year smoking history. She has never used smokeless tobacco. She reports current alcohol use. She reports that she does not currently use drugs.   Family History: The patient's family history includes COPD in her mother; Heart attack in her paternal grandfather and paternal uncle; Heart disease in her father; Hypertension in her mother; Ulcers in her father.   ROS:  Please see the history of present illness. Otherwise, complete review of systems is positive for none  All other systems are reviewed and negative.   Physical Exam: VS:  BP 100/60   Pulse 78   Ht 5\' 6"  (1.676 m)   Wt 149 lb 12.8 oz (67.9 kg)   SpO2 98%   BMI 24.18 kg/m , BMI Body mass index is 24.18 kg/m.  Wt Readings from Last 3 Encounters:  02/07/24 149 lb 12.8 oz (67.9 kg)  03/18/23 158 lb (71.7 kg)  10/27/22 166 lb 3.2 oz (75.4 kg)    General: Patient appears comfortable at rest. HEENT: Conjunctiva and lids normal, oropharynx clear with moist mucosa. Neck: Supple, no elevated JVP or carotid bruits, no thyromegaly. Lungs: Clear to auscultation, nonlabored breathing at rest. Cardiac: Regular rate and rhythm, no S3 or significant systolic murmur, no pericardial rub. Abdomen:  Soft, nontender, no hepatomegaly, bowel sounds present, no guarding or rebound. Extremities: No pitting edema, distal pulses 2+. Skin: Warm and dry. Musculoskeletal: No kyphosis. Neuropsychiatric: Alert and oriented x3, affect grossly appropriate.  Recent Labwork: No results found for requested labs within last 365 days.     Component Value Date/Time   TRIG 84 11/22/2020 2215      Assessment and Plan:   Mood disorder, rule out severe depression, bereavement disorder : Patient lost her mother around August 2024 after which she was not feeling well.  She phrased it as, "feeling sick".  She was in her bed for the last 6 and half months which coincides with the death of her mother.  She reported having nonspecific symptoms like  chest pain, SOB, nausea, dizziness, presyncope, palpitations and if not more.  She has a tangential thought process.  Cannot obtain any specific history from her.  She goes back to her COVID-19 infection hospitalization around 3 years ago where someone stated that she might have POTS that could be causing symptoms of palpitations or elevated heart rates.  She also talks about her recent hospitalization from last year.  She was upset that no one told her that she might have had POTS and she is requesting me to prescribe some medications that will increase oxygen to her brain.  I explained that POTS and cerebral perfusion has no relation to which she appeared to be upset. currently she is on metoprolol which is helping with her rate control but she is not happy about it and wants a different medication that will help her autonomic nervous system.  I discussed with her that underlying etiologies for POTS will need to be ruled out before diagnosing anyone with POTS.  Underlying etiologies like physical deconditioning, mental health issues like anxiety/depression will need to be addressed.  She has an appointment with therapist coming up day for tomorrow.  Will place referral to  psychiatry to rule out any severe depression and bereavement disorder due to the loss of her mother last year.  She will need home physical therapy for which I recommended her to reach out to her PCP.  She looked upset.  Self-reported POTS: Orthostatic vitals today in the clinic were negative for orthostatic hypotension and POTS.  She is convinced that she has POTS.  Her symptoms are secondary to physical deconditioning (as she has been in the bed for the last 6 months after the death of her mother).  Tachycardia: She reports having palpitations and elevated heart rates when she gets up and walks.  She has been in bed for the last 6 and half months.  She is physically deconditioned for which I strongly encouraged physical therapy.  She was seen by [facility] cardiology who recommended physical therapy but she did not want to drive to [facility] for physical therapy.  She wanted home physical therapy but due to Medicaid, she was not able to get anyone to come to her house.  I encouraged her to reach out to her PCP to which she is upset.  Vaginal yeast infection: Patient reported she has yeast infection and is requesting for medications.  Encouraged her to make an appointment with PCP.  She did not look happy with this reply.   Patient walked out of the clinic after the clinic visit stating that she wants to go home and have a drink.   Medication Adjustments/Labs and Tests Ordered: Current medicines are reviewed at length with the patient today.  Concerns regarding medicines are outlined above.    Disposition:  Follow up prn  Signed Ahmyah Gidley Verne Spurr, MD, 02/07/2024 3:35 PM    [facility] at [facility] [address] [place], [place], [place] [address]

## 2024-03-12 ENCOUNTER — Encounter (HOSPITAL_COMMUNITY): Payer: Self-pay

## 2024-03-12 ENCOUNTER — Other Ambulatory Visit: Payer: Self-pay

## 2024-03-12 ENCOUNTER — Emergency Department (HOSPITAL_COMMUNITY)
Admission: EM | Admit: 2024-03-12 | Discharge: 2024-03-12 | Discharge status: Against Medical Advice | Attending: Emergency Medicine | Admitting: Emergency Medicine

## 2024-03-12 DIAGNOSIS — B9689 Other specified bacterial agents as the cause of diseases classified elsewhere: Secondary | ICD-10-CM | POA: Insufficient documentation

## 2024-03-12 DIAGNOSIS — N760 Acute vaginitis: Secondary | ICD-10-CM | POA: Diagnosis not present

## 2024-03-12 DIAGNOSIS — R110 Nausea: Secondary | ICD-10-CM | POA: Diagnosis present

## 2024-03-12 DIAGNOSIS — Z5321 Procedure and treatment not carried out due to patient leaving prior to being seen by health care provider: Secondary | ICD-10-CM | POA: Insufficient documentation

## 2024-03-12 NOTE — ED Triage Notes (Addendum)
 Pt BIB ems for bacterial vaginosis over 9 months. Per pt she was given 10-15 rounds of  antibiotics and the infections will not go away. Pt states nausea with the medication now. 122/74, HR 93, 97% on 3L. CBG 97. Pt states she is here to get rid of "whatever this is."

## 2024-03-12 NOTE — ED Notes (Signed)
Pt called 3 times with no answer- pt not in lobby

## 2024-03-26 ENCOUNTER — Encounter: Payer: Self-pay | Admitting: Obstetrics & Gynecology

## 2024-03-26 ENCOUNTER — Ambulatory Visit: Admitting: Obstetrics & Gynecology

## 2024-03-26 VITALS — BP 104/77 | HR 94 | Ht 66.0 in | Wt 147.5 lb

## 2024-03-26 DIAGNOSIS — R350 Frequency of micturition: Secondary | ICD-10-CM | POA: Diagnosis not present

## 2024-03-26 DIAGNOSIS — A1801 Tuberculosis of spine: Secondary | ICD-10-CM

## 2024-03-26 DIAGNOSIS — R3915 Urgency of urination: Secondary | ICD-10-CM

## 2024-03-26 DIAGNOSIS — M6289 Other specified disorders of muscle: Secondary | ICD-10-CM | POA: Diagnosis not present

## 2024-03-26 LAB — POCT URINALYSIS DIPSTICK
Blood, UA: NEGATIVE
Glucose, UA: NEGATIVE
Ketones, UA: NEGATIVE
Leukocytes, UA: NEGATIVE
Nitrite, UA: NEGATIVE
Protein, UA: NEGATIVE

## 2024-03-26 MED ORDER — DIAZEPAM 10 MG PO TABS: ORAL_TABLET | ORAL | 3 refills | Status: DC
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
## 2024-03-27 ENCOUNTER — Telehealth: Payer: Self-pay

## 2024-03-27 NOTE — Telephone Encounter (Signed)
 Spoke with patient states that she put valium  in yesterday and it worked but 6 hours later pain was back. States that she is hurting so bad that she is in tears. She just put one in about 30 min ago.  She wants to know if she can do it more often that once a day.  (She has already used 3 tablets since her visit yesterday. Every 7 hours the pain is back. Please advise.

## 2024-03-27 NOTE — Telephone Encounter (Signed)
 Patient would like for a nurse to call her concerning her visit on yesterday.

## 2024-03-28 LAB — URINE CULTURE: Organism ID, Bacteria: NO GROWTH

## 2024-03-28 NOTE — Telephone Encounter (Signed)
Pt informed of recommendations

## 2024-04-12 ENCOUNTER — Telehealth: Payer: Self-pay

## 2024-04-12 NOTE — Telephone Encounter (Signed)
 Patient would like for Dr. Deneen Finical nurse to give her a call; she would not say why.

## 2024-04-12 NOTE — Telephone Encounter (Signed)
 Attempted to call patient, no answer

## 2024-04-13 ENCOUNTER — Encounter: Payer: Self-pay | Admitting: Obstetrics & Gynecology

## 2024-04-13 ENCOUNTER — Telehealth: Payer: Self-pay

## 2024-04-13 NOTE — Telephone Encounter (Signed)
 Returned patient's call. States she thought she was better and had dropped to using one valium  vaginally at night. She went without using the valium  one night and has now flared up again. She has one tablet left until she can get refilled again on 5/28 as the script is written for one vaginally at night but has been using two. She is needing a new prescription to use 2 tablets nightly as needed. Patient is also concerned she has a yeast infection as she has vaginal itching and is requesting Diflucan .

## 2024-04-13 NOTE — Telephone Encounter (Signed)
 Can someone please call Debra [name] back; she missed the call on yesterday.

## 2024-04-16 MED ORDER — DIAZEPAM 10 MG PO TABS: ORAL_TABLET | ORAL | 3 refills | Status: DC

## 2024-04-17 ENCOUNTER — Ambulatory Visit: Admitting: Obstetrics & Gynecology

## 2024-04-17 VITALS — BP 106/62

## 2024-04-17 DIAGNOSIS — L739 Follicular disorder, unspecified: Secondary | ICD-10-CM

## 2024-04-17 DIAGNOSIS — M6289 Other specified disorders of muscle: Secondary | ICD-10-CM

## 2024-04-17 DIAGNOSIS — N3281 Overactive bladder: Secondary | ICD-10-CM

## 2024-04-17 MED ORDER — CEPHALEXIN 500 MG PO CAPS: 500.0000 mg | ORAL_CAPSULE | Freq: Three times a day (TID) | ORAL | 0 refills | Status: DC

## 2024-04-17 MED ORDER — SOLIFENACIN SUCCINATE 10 MG PO TABS: 10.0000 mg | ORAL_TABLET | Freq: Every day | ORAL | 11 refills | Status: AC

## 2024-04-17 MED ORDER — DOXYCYCLINE HYCLATE 100 MG PO TABS: 100.0000 mg | ORAL_TABLET | Freq: Two times a day (BID) | ORAL | 0 refills | Status: DC

## 2024-04-17 MED ORDER — TERCONAZOLE 0.4 % VA CREA: 1.0000 | TOPICAL_CREAM | Freq: Every day | VAGINAL | 0 refills | Status: DC

## 2024-04-17 NOTE — Progress Notes (Unsigned)
 Follow up appointment for response: Vaginal spasm, high tone pelvic floor  Chief Complaint  Patient presents with   Follow-up    Blood pressure 106/62.  Pt is in for recheck today for her therapy for high tone vaginal floor pain and spasm She is "significantly better" on the vaginal valium   Will now address the OAB with vesicare  Rx for yeast, prolonged QT avoid diflucan , terazol  MEDS ordered this encounter: Meds ordered this encounter  Medications   terconazole (TERAZOL 7) 0.4 % vaginal cream    Sig: Place 1 applicator vaginally at bedtime.    Dispense:  45 g    Refill:  0   solifenacin (VESICARE) 10 MG tablet    Sig: Take 1 tablet (10 mg total) by mouth daily.    Dispense:  30 tablet    Refill:  11   doxycycline  (VIBRA -TABS) 100 MG tablet    Sig: Take 1 tablet (100 mg total) by mouth 2 (two) times daily.    Dispense:  20 tablet    Refill:  0   cephALEXin (KEFLEX) 500 MG capsule    Sig: Take 1 capsule (500 mg total) by mouth 3 (three) times daily.    Dispense:  21 capsule    Refill:  0    Orders for this encounter: No orders of the defined types were placed in this encounter.   Impression + Management Plan   ICD-10-CM   1. High-tone pelvic floor dysfunction: valium  10 mg PV nightly  M62.89    working well: "significantly better"    2. OAB (overactive bladder): trial of vesicare  N32.81     3. Folliculitis: keflex course  L73.9       Follow Up: Return in about 3 months (around 07/18/2024) for Follow up, with Dr Randolm Butte.     All questions were answered.  Past Medical History:  Diagnosis Date   Anxiety    panic attack   Arthritis    "lower back" (06/07/2018)- osteo   Chronic lower back pain    Complication of anesthesia    anesthesia for neck surgery - had confusion/forgetfulness   COPD (chronic obstructive pulmonary disease) (HCC)    Depression    Dyspnea    with exertion   GERD (gastroesophageal reflux disease)    History of bleeding ulcers 1994    History of hiatal hernia    History of low potassium    Pneumonia 2013   POTS (postural orthostatic tachycardia syndrome)     Past Surgical History:  Procedure Laterality Date   ANTERIOR CERVICAL DECOMP/DISCECTOMY FUSION N/A 05/18/2017   Procedure: ACDF - C6-C7;  Surgeon: Audie Bleacher, MD;  Location: [facility] OR;  Service: Neurosurgery;  Laterality: N/A;   ANTERIOR LUMBAR FUSION  2013   "L-6"   ESOPHAGOGASTRODUODENOSCOPY N/A 07/14/2017   Procedure: ESOPHAGOGASTRODUODENOSCOPY (EGD);  Surgeon: Asencion Blacksmith, MD;  Location: [facility] ENDOSCOPY;  Service: Endoscopy;  Laterality: N/A;   POSTERIOR LUMBAR FUSION  06/07/2018   L5-S1   TONSILLECTOMY     UPPER GASTROINTESTINAL ENDOSCOPY  1994   VAGINAL HYSTERECTOMY  1997    OB History     Gravida  2   Para  1   Term  1   Preterm      AB  1   Living  1      SAB      IAB      Ectopic      Multiple      Live Births  1  Allergies  Allergen Reactions   Cefaclor Hives, Diarrhea and Rash   Sertraline     Suicidal thoughts, insomnia, nightmares   Statins Dermatitis and Rash    Multiple ecchymosis areas on all extremities.   Amoxicillin-Pot Clavulanate Diarrhea    Has patient had a PCN reaction causing immediate rash, facial/tongue/throat swelling, SOB or lightheadedness with hypotension: No Has patient had a PCN reaction causing severe rash involving mucus membranes or skin necrosis: No Has patient had a PCN reaction that required hospitalization: No Has patient had a PCN reaction occurring within the last 10 years: Yes If all of the above answers are "NO", then may proceed with Cephalosporin use.    Buspar  [Buspirone ] Nausea And Vomiting   Codeine Nausea And Vomiting    If pt takes a lot of this med   Latex Itching and Swelling    redness   Levaquin  [Levofloxacin ] Swelling    Can take up to 500 mg a day   Nsaids     Bleeding ulcers   Ciprofloxacin Nausea Only and Rash    Room was spinning    Social  History   Socioeconomic History   Marital status: Divorced    Spouse name: Not on file   Number of children: Not on file   Years of education: Not on file   Highest education level: Not on file  Occupational History   Not on file  Tobacco Use   Smoking status: Every Day    Current packs/day: 0.50    Average packs/day: 0.5 packs/day for 35.0 years (17.5 ttl pk-yrs)    Types: Cigarettes   Smokeless tobacco: Never   Tobacco comments:    smokes 1/2 pack per day [facility] 05/10/22  Vaping Use   Vaping status: Former   Quit date: 04/09/2022  Substance and Sexual Activity   Alcohol use: Yes    Comment: 06/07/2018 "might have a beer q 3-4 months"   Drug use: Not Currently   Sexual activity: Not Currently    Birth control/protection: Surgical    Comment: hyst  Other Topics Concern   Not on file  Social History Narrative   Not on file   Social Drivers of Health   Financial Resource Strain: Low Risk  (03/26/2024)   Overall Financial Resource Strain (CARDIA)    Difficulty of Paying Living Expenses: Not very hard  Food Insecurity: No Food Insecurity (03/26/2024)   Hunger Vital Sign    Worried About Running Out of Food in the Last Year: Never true    Ran Out of Food in the Last Year: Never true  Transportation Needs: No Transportation Needs (03/26/2024)   PRAPARE - Administrator, Civil Service (Medical): No    Lack of Transportation (Non-Medical): No  Physical Activity: Inactive (03/26/2024)   Exercise Vital Sign    Days of Exercise per Week: 0 days    Minutes of Exercise per Session: 0 min  Stress: Stress Concern Present (03/26/2024)   [employer] of Occupational Health - Occupational Stress Questionnaire    Feeling of Stress : Rather much  Social Connections: Moderately Isolated (03/26/2024)   Social Connection and Isolation Panel [NHANES]    Frequency of Communication with Friends and Family: Three times a week    Frequency of Social Gatherings with Friends and Family:  Once a week    Attends Religious Services: More than 4 times per year    Active Member of [employer] or Organizations: No    Attends Banker  Meetings: Never    Marital Status: Divorced    Family History  Problem Relation Age of Onset   Heart attack Paternal Grandfather    Ulcers Father    Heart disease Father    Hypertension Mother    COPD Mother    Heart attack Paternal Uncle

## 2024-04-19 ENCOUNTER — Telehealth: Payer: Self-pay | Admitting: Obstetrics & Gynecology

## 2024-04-19 NOTE — Telephone Encounter (Signed)
 Patient called to let Dr Randolm Butte know that the estradiol vaginal insert wasn't called in and she forgot what man's razor it was you said she should use to avoid ingrown hairs. She does have a dentist appointment today. She said if she doesn't answer that you can leave a message. Please advise.

## 2024-04-20 MED ORDER — ESTRADIOL 10 MCG VA TABS: 1.0000 | ORAL_TABLET | VAGINAL | 11 refills | Status: DC

## 2024-04-20 NOTE — Addendum Note (Signed)
 Addended by: Greenley Martone H on: 04/20/2024 11:37 AM   Modules accepted: Orders

## 2024-05-16 ENCOUNTER — Telehealth: Payer: Self-pay | Admitting: *Deleted

## 2024-05-16 NOTE — Telephone Encounter (Signed)
 Medicaid doesn't cover Yuvafem . The preferred drug is Vagifem .

## 2024-05-17 ENCOUNTER — Other Ambulatory Visit: Payer: Self-pay | Admitting: Obstetrics & Gynecology

## 2024-05-17 MED ORDER — ESTRADIOL 10 MCG VA TABS: ORAL_TABLET | VAGINAL | 12 refills | Status: AC

## 2024-05-23 ENCOUNTER — Telehealth: Payer: Self-pay

## 2024-05-23 NOTE — Telephone Encounter (Signed)
 Patient would like for someone to call her about a prior authorization.

## 2024-06-06 MED ORDER — PREMARIN 0.625 MG/GM VA CREA: 1.0000 | TOPICAL_CREAM | VAGINAL | 12 refills | Status: AC

## 2024-06-06 NOTE — Telephone Encounter (Signed)
 Spoke to this patient regarding denial of Estradiol  vaginal tablets. Medicaid is denying this medication. She is willing to try something else for vaginal dryness as she was only requesting this specific one since she had a friend who used it and had success. Medicaid covers Estring  and Premarin  vaginal cream if either would be appropriate for her to use.   Also, 8 days out of this month, she needed to use 2 tablets due to her discomfort. With that being said, it is too early for her to have the medication refilled but says this has happened before and Dr Jayne just had to let the pharmacy know it was ok to have it filled early.    Patient informed Dr Jayne was out of the office and in surgery today but will send message.

## 2024-07-02 ENCOUNTER — Encounter: Payer: Self-pay | Admitting: Adult Health

## 2024-07-02 ENCOUNTER — Ambulatory Visit (INDEPENDENT_AMBULATORY_CARE_PROVIDER_SITE_OTHER): Admitting: Adult Health

## 2024-07-02 VITALS — BP 99/68 | HR 82 | Ht 66.0 in | Wt 148.0 lb

## 2024-07-02 DIAGNOSIS — M6289 Other specified disorders of muscle: Secondary | ICD-10-CM

## 2024-07-02 MED ORDER — DIAZEPAM 10 MG PO TABS
ORAL_TABLET | ORAL | 0 refills | Status: DC
Start: 2024-07-02 — End: 2024-07-02

## 2024-07-02 MED ORDER — TERCONAZOLE 0.4 % VA CREA: 1.0000 | TOPICAL_CREAM | Freq: Every day | VAGINAL | 0 refills | Status: DC

## 2024-07-02 MED ORDER — DIAZEPAM 10 MG PO TABS: ORAL_TABLET | ORAL | 0 refills | Status: DC

## 2024-07-02 NOTE — Progress Notes (Addendum)
  Subjective:     Patient ID: Debra [name], female   DOB: 1966/07/29, 58 y.o.   MRN: [id number]  HPI Debra [name] is a 58 year old white female, divorced, sp hysterectomy in complaining of vaginal spasm about a 8/10. Has been using valium  10 mg bid instead of nightly in vagina for almost 2 weeks, she needs refill on valium  and she requests refill on Terazol, has taken Levaquin  recently.  She says she has had 3 back surgeries and back hurts too. She had someone drive her today,because she took pain meds to get here. She said Dr Jayne told could use bid if really bad, but not in his notes.  She says she really does not want to have to do pelvic floor PT. PCP is [facility]  Review of Systems +vaginal spasms +nausea  Back hurts too Reviewed past medical,surgical, social and family history. Reviewed medications and allergies.     Objective:   Physical Exam BP 99/68 (BP Location: Right Arm, Patient Position: Sitting, Cuff Size: Normal)   Pulse 82   Ht 5' 6 (1.676 m)   Wt 148 lb (67.1 kg)   BMI 23.89 kg/m     Skin warm and dry. Lungs: clear to ausculation bilaterally. Cardiovascular: regular rate and rhythm. Pelvic exam was deferred, today. Fall risk is low  Upstream - 07/02/24 1403       Pregnancy Intention Screening   Does the patient want to become pregnant in the next year? N/A    Does the patient's partner want to become pregnant in the next year? N/A    Would the patient like to discuss contraceptive options today? N/A      Contraception Wrap Up   Current Method Female Sterilization   hyst   End Method Female Sterilization   hyst   Contraception Counseling Provided No           Assessment:     1. High-tone pelvic floor dysfunction: valium  10 mg PV nightly (Primary) +vaginal spasms worse for last few weeks, has used PVC and wonders if that made them worse, has used valium  10 mg vaginally bid for last 2 weeks,she says she needs refill.Discussed with Dr Ozan will rx valium  10 mg 1/2  tablet in vagina bid disp #15, and  keep appt with Dr Jayne  Will refill Terazol 7  in vagina for 7 days  at her request  [facility] to fill new Rx for Valium  10 mg 1/2 tablet bid in vagina, disp #15 tablets and see Dr Jayne 07/12/24.  Stop PVC til sees Dr Jayne, has appt 8/14    Plan:     Follow up with Dr Jayne 07/12/24

## 2024-07-02 NOTE — Addendum Note (Signed)
 Addended by: Hanan Mcwilliams A on: 07/02/2024 03:29 PM   Modules accepted: Orders

## 2024-07-12 ENCOUNTER — Ambulatory Visit (INDEPENDENT_AMBULATORY_CARE_PROVIDER_SITE_OTHER): Admitting: Obstetrics & Gynecology

## 2024-07-12 ENCOUNTER — Encounter: Payer: Self-pay | Admitting: Obstetrics & Gynecology

## 2024-07-12 ENCOUNTER — Ambulatory Visit: Admitting: Obstetrics & Gynecology

## 2024-07-12 VITALS — BP 111/72 | HR 112 | Ht 66.0 in | Wt 148.0 lb

## 2024-07-12 DIAGNOSIS — M6289 Other specified disorders of muscle: Secondary | ICD-10-CM | POA: Diagnosis not present

## 2024-07-12 MED ORDER — DIAZEPAM 10 MG PO TABS: ORAL_TABLET | ORAL | 2 refills | Status: DC

## 2024-07-12 NOTE — Progress Notes (Signed)
 Follow up appointment  Vaginal valium therapy  Chief Complaint  Patient presents with   Follow-up    Blood pressure 111/72, pulse (!) 112, height 5' 6 (1.676 m), weight 148 lb (67.1 kg).  Pt is having ^intensity at times and requiring on occasion 1 valium 10 mg at night and during the day Was hoping her repsonse would no longer need that however she is having exaserbations Will provide 1 pv nightly and 1 prn disp #60 MEDS ordered this encounter: Meds ordered this encounter  Medications   diazepam (VALIUM) 10 MG tablet    Sig: 1 tablet per vagina nightly,1 during the day prn    Dispense:  60 tablet    Refill:  2    Orders for this encounter: No orders of the defined types were placed in this encounter.   Impression + Management Plan   ICD-10-CM   1. High-tone pelvic floor dysfunction: valium 10 mg PV nightly, twcie daily prn  M62.89       Follow Up: Return in about 3 months (around 10/12/2024) for Follow up, with Dr Jayne.     All questions were answered.  Past Medical History:  Diagnosis Date   Anxiety    panic attack   Arthritis    lower back (06/07/2018)- osteo   Chronic lower back pain    Complication of anesthesia    anesthesia for neck surgery - had confusion/forgetfulness   COPD (chronic obstructive pulmonary disease) (HCC)    Depression    Dyspnea    with exertion   GERD (gastroesophageal reflux disease)    History of bleeding ulcers 1994   History of hiatal hernia    History of low potassium    Pneumonia 2013   POTS (postural orthostatic tachycardia syndrome)     Past Surgical History:  Procedure Laterality Date   ANTERIOR CERVICAL DECOMP/DISCECTOMY FUSION N/A 05/18/2017   Procedure: ACDF - C6-C7;  Surgeon: Gillie Duncans, MD;  Location: [facility] OR;  Service: Neurosurgery;  Laterality: N/A;   ANTERIOR LUMBAR FUSION  2013   L-6   ESOPHAGOGASTRODUODENOSCOPY N/A 07/14/2017   Procedure: ESOPHAGOGASTRODUODENOSCOPY (EGD);  Surgeon: Aneita Gwendlyn DASEN, MD;   Location: [facility] ENDOSCOPY;  Service: Endoscopy;  Laterality: N/A;   POSTERIOR LUMBAR FUSION  06/07/2018   L5-S1   TONSILLECTOMY     UPPER GASTROINTESTINAL ENDOSCOPY  1994   VAGINAL HYSTERECTOMY  1997    OB History     Gravida  2   Para  1   Term  1   Preterm      AB  1   Living  1      SAB      IAB      Ectopic      Multiple      Live Births  1           Allergies  Allergen Reactions   Cefaclor Hives, Diarrhea and Rash   Sertraline     Suicidal thoughts, insomnia, nightmares   Statins Dermatitis and Rash    Multiple ecchymosis areas on all extremities.   Amoxicillin-Pot Clavulanate Diarrhea    Has patient had a PCN reaction causing immediate rash, facial/tongue/throat swelling, SOB or lightheadedness with hypotension: No Has patient had a PCN reaction causing severe rash involving mucus membranes or skin necrosis: No Has patient had a PCN reaction that required hospitalization: No Has patient had a PCN reaction occurring within the last 10 years: Yes If all of the above answers are NO, then  may proceed with Cephalosporin use.    Buspar [Buspirone] Nausea And Vomiting   Codeine Nausea And Vomiting    If pt takes a lot of this med   Latex Itching and Swelling    redness   Levaquin [Levofloxacin] Swelling    Can take up to 500 mg a day   Nsaids     Bleeding ulcers   Ciprofloxacin Nausea Only and Rash    Room was spinning    Social History   Socioeconomic History   Marital status: Divorced    Spouse name: Not on file   Number of children: Not on file   Years of education: Not on file   Highest education level: Not on file  Occupational History   Not on file  Tobacco Use   Smoking status: Every Day    Current packs/day: 0.50    Average packs/day: 0.5 packs/day for 35.0 years (17.5 ttl pk-yrs)    Types: Cigarettes   Smokeless tobacco: Never   Tobacco comments:    smokes 1/2 pack per day [facility] 05/10/22  Vaping Use   Vaping status: Former    Quit date: 04/09/2022  Substance and Sexual Activity   Alcohol use: Yes    Comment: 06/07/2018 might have a beer q 3-4 months   Drug use: Not Currently   Sexual activity: Not Currently    Birth control/protection: Surgical    Comment: hyst  Other Topics Concern   Not on file  Social History Narrative   Not on file   Social Drivers of Health   Financial Resource Strain: Low Risk  (05/29/2024)   Received from [facility]   Overall Financial Resource Strain (CARDIA)    How hard is it for you to pay for the very basics like food, housing, medical care, and heating?: Not very hard  Food Insecurity: Low Risk  (06/05/2024)   Received from [facility]   Hunger Vital Sign    Within the past 12 months, you worried that your food would run out before you got money to buy more: Never true    Within the past 12 months, the food you bought just didn't last and you didn't have money to get more. : Never true  Recent Concern: Food Insecurity - Food Insecurity Present (05/29/2024)   Received from [facility]   Hunger Vital Sign    Within the past 12 months, you worried that your food would run out before you got the money to buy more.: Sometimes true    Within the past 12 months, the food you bought just didn't last and you didn't have money to get more.: Patient declined  Transportation Needs: No Transportation Needs (06/05/2024)   Received from [facility]    In the past 12 months, has lack of reliable transportation kept you from medical appointments, meetings, work or from getting things needed for daily living? : No  Physical Activity: Inactive (03/26/2024)   Exercise Vital Sign    Days of Exercise per Week: 0 days    Minutes of Exercise per Session: 0 min  Stress: Stress Concern Present (03/26/2024)   [employer] of Occupational Health - Occupational Stress Questionnaire    Feeling of Stress : Rather much  Social Connections: Moderately Isolated (03/26/2024)    Social Connection and Isolation Panel    Frequency of Communication with Friends and Family: Three times a week    Frequency of Social Gatherings with Friends and Family: Once a  week    Attends Religious Services: More than 4 times per year    Active Member of Clubs or Organizations: No    Attends Banker Meetings: Never    Marital Status: Divorced    Family History  Problem Relation Age of Onset   Heart attack Paternal Grandfather    Ulcers Father    Heart disease Father    Hypertension Mother    COPD Mother    Heart attack Paternal Uncle

## 2024-07-18 ENCOUNTER — Other Ambulatory Visit (HOSPITAL_COMMUNITY): Payer: Self-pay

## 2024-08-03 ENCOUNTER — Encounter (HOSPITAL_COMMUNITY): Payer: Self-pay | Admitting: Psychiatry

## 2024-09-12 ENCOUNTER — Encounter (INDEPENDENT_AMBULATORY_CARE_PROVIDER_SITE_OTHER): Payer: Self-pay | Admitting: Gastroenterology

## 2024-10-17 ENCOUNTER — Telehealth: Payer: Self-pay | Admitting: Adult Health

## 2024-10-17 MED ORDER — TERCONAZOLE 0.4 % VA CREA: 1.0000 | TOPICAL_CREAM | Freq: Every day | VAGINAL | 0 refills | Status: AC

## 2024-10-17 NOTE — Addendum Note (Signed)
 Addended by: Joyice Magda A on: 10/17/2024 03:52 PM   Modules accepted: Orders

## 2024-10-17 NOTE — Telephone Encounter (Signed)
 Refilled terazol

## 2024-10-17 NOTE — Telephone Encounter (Signed)
 Patient calling stating that she has another infection, yeast infection itching and burning, the over counter is not helping. And would like another script sent to pharmacy for cream

## 2024-10-17 NOTE — Telephone Encounter (Signed)
 Pt aware Terazol was sent in. Pt voiced understanding. JSY

## 2024-11-02 ENCOUNTER — Ambulatory Visit: Admitting: Physician Assistant

## 2024-11-06 ENCOUNTER — Other Ambulatory Visit: Payer: Self-pay | Admitting: Obstetrics & Gynecology

## 2024-11-13 ENCOUNTER — Other Ambulatory Visit (HOSPITAL_COMMUNITY)
Admission: RE | Admit: 2024-11-13 | Discharge: 2024-11-13 | Disposition: A | Discharge status: Home / Self Care | Source: Ambulatory Visit | Attending: Obstetrics & Gynecology | Admitting: Obstetrics & Gynecology

## 2024-11-13 ENCOUNTER — Ambulatory Visit: Admitting: Obstetrics & Gynecology

## 2024-11-13 VITALS — BP 108/65 | HR 75 | Ht 66.0 in | Wt 153.0 lb

## 2024-11-13 DIAGNOSIS — Z113 Encounter for screening for infections with a predominantly sexual mode of transmission: Secondary | ICD-10-CM

## 2024-11-13 DIAGNOSIS — M6289 Other specified disorders of muscle: Secondary | ICD-10-CM

## 2024-11-13 DIAGNOSIS — B3731 Acute candidiasis of vulva and vagina: Secondary | ICD-10-CM

## 2024-11-13 MED ORDER — SILVER SULFADIAZINE 1 % EX CREA: TOPICAL_CREAM | CUTANEOUS | 11 refills | Status: AC

## 2024-11-13 NOTE — Progress Notes (Signed)
 Follow up appointment for results: Pelvic floor dysfunction  Chief Complaint  Patient presents with   Follow-up    Valium  vaginally    Blood pressure 108/65, pulse 75, height 5' 6 (1.676 m), weight 153 lb (69.4 kg).  Pt overall happy and doing well with the PV valium , mostly uses 1 per day, occasionally twice Also her vaginal tissue is feeling better Not having sex currently No abnormal discharge or bleeding No urinary symtpoms or changes in bowel habits  MEDS ordered this encounter: Meds ordered this encounter  Medications   silver  sulfADIAZINE  (SILVADENE ) 1 % cream    Sig: Apply to area three times a day    Dispense:  50 g    Refill:  11    Orders for this encounter: No orders of the defined types were placed in this encounter.   Impression + Management Plan   ICD-10-CM   1. High-tone pelvic floor dysfunction: valium  10 mg PV nightly, twcie daily prn  M62.89     2. Vulvovaginal candidiasis  B37.31     3. Screening examination for STI  Z11.3 Cervicovaginal ancillary only       Follow Up: prn     All questions were answered.  Past Medical History:  Diagnosis Date   Anxiety    panic attack   Arthritis    lower back (06/07/2018)- osteo   Chronic lower back pain    Complication of anesthesia    anesthesia for neck surgery - had confusion/forgetfulness   COPD (chronic obstructive pulmonary disease) (HCC)    Depression    Dyspnea    with exertion   GERD (gastroesophageal reflux disease)    History of bleeding ulcers 1994   History of hiatal hernia    History of low potassium    Pneumonia 2013   POTS (postural orthostatic tachycardia syndrome)     Past Surgical History:  Procedure Laterality Date   ANTERIOR CERVICAL DECOMP/DISCECTOMY FUSION N/A 05/18/2017   Procedure: ACDF - C6-C7;  Surgeon: Gillie Duncans, MD;  Location: [facility] OR;  Service: Neurosurgery;  Laterality: N/A;   ANTERIOR LUMBAR FUSION  2013   L-6   ESOPHAGOGASTRODUODENOSCOPY N/A  07/14/2017   Procedure: ESOPHAGOGASTRODUODENOSCOPY (EGD);  Surgeon: Aneita Gwendlyn DASEN, MD;  Location: [facility] ENDOSCOPY;  Service: Endoscopy;  Laterality: N/A;   POSTERIOR LUMBAR FUSION  06/07/2018   L5-S1   TONSILLECTOMY     UPPER GASTROINTESTINAL ENDOSCOPY  1994   VAGINAL HYSTERECTOMY  1997    OB History     Gravida  2   Para  1   Term  1   Preterm      AB  1   Living  1      SAB      IAB      Ectopic      Multiple      Live Births  1           Allergies[1]  Social History   Socioeconomic History   Marital status: Divorced    Spouse name: Not on file   Number of children: Not on file   Years of education: Not on file   Highest education level: Not on file  Occupational History   Not on file  Tobacco Use   Smoking status: Every Day    Current packs/day: 0.50    Average packs/day: 0.5 packs/day for 35.0 years (17.5 ttl pk-yrs)    Types: Cigarettes   Smokeless tobacco: Never   Tobacco comments:  smokes 1/2 pack per day [facility] 05/10/22  Vaping Use   Vaping status: Former   Quit date: 04/09/2022  Substance and Sexual Activity   Alcohol use: Yes    Comment: 06/07/2018 might have a beer q 3-4 months   Drug use: Not Currently   Sexual activity: Not Currently    Birth control/protection: Surgical    Comment: hyst  Other Topics Concern   Not on file  Social History Narrative   Not on file   Social Drivers of Health   Tobacco Use: High Risk (09/13/2024)   Received from [facility]   Patient History    Smoking Tobacco Use: Every Day    Smokeless Tobacco Use: Current    Passive Exposure: Not on file  Financial Resource Strain: Low Risk (05/29/2024)   Received from [facility]   Overall Financial Resource Strain (CARDIA)    How hard is it for you to pay for the very basics like food, housing, medical care, and heating?: Not very hard  Food Insecurity: Low Risk (09/13/2024)   Received from [facility]   Epic    Within the past 12 months, you  worried that your food would run out before you got money to buy more: Never true    Within the past 12 months, the food you bought just didn't last and you didn't have money to get more. : Never true  Transportation Needs: No Transportation Needs (09/13/2024)   Received from [facility]    In the past 12 months, has lack of reliable transportation kept you from medical appointments, meetings, work or from getting things needed for daily living? : No  Physical Activity: Inactive (03/26/2024)   Exercise Vital Sign    Days of Exercise per Week: 0 days    Minutes of Exercise per Session: 0 min  Stress: Stress Concern Present (03/26/2024)   [employer] of Occupational Health - Occupational Stress Questionnaire    Feeling of Stress : Rather much  Social Connections: Moderately Isolated (03/26/2024)   Social Connection and Isolation Panel    Frequency of Communication with Friends and Family: Three times a week    Frequency of Social Gatherings with Friends and Family: Once a week    Attends Religious Services: More than 4 times per year    Active Member of Clubs or Organizations: No    Attends Banker Meetings: Never    Marital Status: Divorced  Depression (PHQ2-9): Low Risk (03/26/2024)   Depression (PHQ2-9)    PHQ-2 Score: 4  Alcohol Screen: Low Risk (03/26/2024)   Alcohol Screen    Last Alcohol Screening Score (AUDIT): 0  Housing: Low Risk (09/13/2024)   Received from [facility]   Epic    What is your living situation today?: I have a steady place to live    Think about the place you live. Do you have problems with any of the following? Choose all that apply:: None/None on this list  Utilities: Low Risk (09/13/2024)   Received from [facility]   Utilities    In the past 12 months has the electric, gas, oil, or water company threatened to shut off services in your home? : No  Health Literacy: Not on file    Family History  Problem Relation  Age of Onset   Heart attack Paternal Grandfather    Ulcers Father    Heart disease Father    Hypertension Mother    COPD Mother  Heart attack Paternal Uncle        [1]  Allergies Allergen Reactions   Cefaclor Hives, Diarrhea and Rash   Sertraline     Suicidal thoughts, insomnia, nightmares   Statins Dermatitis and Rash    Multiple ecchymosis areas on all extremities.   Amoxicillin-Pot Clavulanate Diarrhea    Has patient had a PCN reaction causing immediate rash, facial/tongue/throat swelling, SOB or lightheadedness with hypotension: No Has patient had a PCN reaction causing severe rash involving mucus membranes or skin necrosis: No Has patient had a PCN reaction that required hospitalization: No Has patient had a PCN reaction occurring within the last 10 years: Yes If all of the above answers are NO, then may proceed with Cephalosporin use.    Buspar  [Buspirone ] Nausea And Vomiting   Codeine Nausea And Vomiting    If pt takes a lot of this med   Latex Itching and Swelling    redness   Levaquin  [Levofloxacin ] Swelling    Can take up to 500 mg a day   Nsaids     Bleeding ulcers   Ciprofloxacin Nausea Only and Rash    Room was spinning

## 2024-11-15 LAB — CERVICOVAGINAL ANCILLARY ONLY
Bacterial Vaginitis (gardnerella): NEGATIVE
Candida Glabrata: NEGATIVE
Candida Vaginitis: NEGATIVE
Comment: NEGATIVE
Comment: NEGATIVE
Comment: NEGATIVE

## 2024-12-27 ENCOUNTER — Telehealth: Payer: Self-pay | Admitting: Obstetrics & Gynecology

## 2024-12-27 NOTE — Telephone Encounter (Signed)
 Patient started back the premarin  cream, the spasms got worse, quit using it this past Monday. Takes 2 valium  since using premarin  cream. Valium  starts working within 30 minutes of taking it. 2nd time since starting  cream and spasms have got worse. Gave it a full month but made it worse. Soaking in hot tubs of water that seems to help. She wants to do examination next time she needs to come in. Need some estrogen. Please advise.

## 2025-01-01 ENCOUNTER — Telehealth: Payer: Self-pay | Admitting: Obstetrics & Gynecology

## 2025-01-01 NOTE — Telephone Encounter (Signed)
 Pt is asking for a call from the office.

## 2025-01-15 ENCOUNTER — Ambulatory Visit: Admitting: Physician Assistant

## 2025-01-22 ENCOUNTER — Ambulatory Visit: Admitting: Obstetrics & Gynecology
